# Patient Record
Sex: Male | Born: 1955
Health system: Southern US, Community
[De-identification: ages and names within clinical notes are randomized; demographics above are authoritative.]

## PROBLEM LIST (undated history)

## (undated) DIAGNOSIS — K469 Unspecified abdominal hernia without obstruction or gangrene: Secondary | ICD-10-CM

## (undated) DIAGNOSIS — IMO0002 Reserved for concepts with insufficient information to code with codable children: Secondary | ICD-10-CM

## (undated) DIAGNOSIS — T7840XA Allergy, unspecified, initial encounter: Secondary | ICD-10-CM

## (undated) DIAGNOSIS — H3530 Unspecified macular degeneration: Secondary | ICD-10-CM

## (undated) DIAGNOSIS — M1990 Unspecified osteoarthritis, unspecified site: Secondary | ICD-10-CM

## (undated) DIAGNOSIS — H04129 Dry eye syndrome of unspecified lacrimal gland: Secondary | ICD-10-CM

## (undated) DIAGNOSIS — K3184 Gastroparesis: Secondary | ICD-10-CM

## (undated) DIAGNOSIS — E785 Hyperlipidemia, unspecified: Secondary | ICD-10-CM

## (undated) DIAGNOSIS — K5790 Diverticulosis of intestine, part unspecified, without perforation or abscess without bleeding: Secondary | ICD-10-CM

## (undated) DIAGNOSIS — F1011 Alcohol abuse, in remission: Secondary | ICD-10-CM

## (undated) DIAGNOSIS — I480 Paroxysmal atrial fibrillation: Secondary | ICD-10-CM

## (undated) DIAGNOSIS — H9313 Tinnitus, bilateral: Secondary | ICD-10-CM

## (undated) DIAGNOSIS — N529 Male erectile dysfunction, unspecified: Secondary | ICD-10-CM

## (undated) DIAGNOSIS — C4491 Basal cell carcinoma of skin, unspecified: Secondary | ICD-10-CM

## (undated) DIAGNOSIS — R011 Cardiac murmur, unspecified: Secondary | ICD-10-CM

## (undated) DIAGNOSIS — E119 Type 2 diabetes mellitus without complications: Secondary | ICD-10-CM

## (undated) HISTORY — DX: Basal cell carcinoma of skin, unspecified: C44.91

## (undated) HISTORY — DX: Tinnitus, bilateral: H93.13

## (undated) HISTORY — PX: LUMBAR FUSION: SHX111

## (undated) HISTORY — DX: Male erectile dysfunction, unspecified: N52.9

## (undated) HISTORY — DX: Unspecified macular degeneration: H35.30

## (undated) HISTORY — PX: SPINE SURGERY: SHX786

## (undated) HISTORY — DX: Unspecified abdominal hernia without obstruction or gangrene: K46.9

## (undated) HISTORY — DX: Allergy, unspecified, initial encounter: T78.40XA

## (undated) HISTORY — DX: Paroxysmal atrial fibrillation: I48.0

## (undated) HISTORY — DX: Cardiac murmur, unspecified: R01.1

## (undated) HISTORY — DX: Unspecified osteoarthritis, unspecified site: M19.90

## (undated) HISTORY — PX: THUMB FUSION: SUR636

## (undated) HISTORY — DX: Reserved for concepts with insufficient information to code with codable children: IMO0002

## (undated) HISTORY — DX: Diverticulosis of intestine, part unspecified, without perforation or abscess without bleeding: K57.90

## (undated) HISTORY — DX: Gastroparesis: K31.84

## (undated) HISTORY — DX: Hyperlipidemia, unspecified: E78.5

## (undated) HISTORY — DX: Alcohol abuse, in remission: F10.11

## (undated) HISTORY — DX: Dry eye syndrome of unspecified lacrimal gland: H04.129

## (undated) HISTORY — DX: Type 2 diabetes mellitus without complications: E11.9

---
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
## 1974-06-26 HISTORY — PX: VASECTOMY: SHX75

## 1994-06-26 DIAGNOSIS — R011 Cardiac murmur, unspecified: Secondary | ICD-10-CM

## 1994-06-26 HISTORY — DX: Cardiac murmur, unspecified: R01.1

## 1998-02-15 ENCOUNTER — Ambulatory Visit (HOSPITAL_COMMUNITY): Admission: RE | Admit: 1998-02-15 | Discharge: 1998-02-15 | Payer: Self-pay | Admitting: Gastroenterology

## 2001-10-27 ENCOUNTER — Emergency Department (HOSPITAL_COMMUNITY): Admission: EM | Admit: 2001-10-27 | Discharge: 2001-10-27 | Payer: Self-pay | Admitting: Emergency Medicine

## 2004-06-26 DIAGNOSIS — N529 Male erectile dysfunction, unspecified: Secondary | ICD-10-CM

## 2004-06-26 HISTORY — DX: Male erectile dysfunction, unspecified: N52.9

## 2004-07-12 ENCOUNTER — Ambulatory Visit: Payer: Self-pay | Admitting: Gastroenterology

## 2004-07-20 ENCOUNTER — Ambulatory Visit: Payer: Self-pay | Admitting: Gastroenterology

## 2004-12-28 ENCOUNTER — Encounter: Admission: RE | Admit: 2004-12-28 | Discharge: 2005-01-06 | Payer: Self-pay | Admitting: Occupational Medicine

## 2005-05-02 ENCOUNTER — Encounter: Admission: RE | Admit: 2005-05-02 | Discharge: 2005-05-02 | Payer: Self-pay | Admitting: Orthopaedic Surgery

## 2005-05-17 ENCOUNTER — Encounter: Admission: RE | Admit: 2005-05-17 | Discharge: 2005-05-17 | Payer: Self-pay | Admitting: Orthopaedic Surgery

## 2005-05-23 ENCOUNTER — Encounter: Admission: RE | Admit: 2005-05-23 | Discharge: 2005-06-22 | Payer: Self-pay | Admitting: Orthopaedic Surgery

## 2005-06-01 ENCOUNTER — Encounter: Admission: RE | Admit: 2005-06-01 | Discharge: 2005-06-01 | Payer: Self-pay | Admitting: Orthopaedic Surgery

## 2006-01-30 ENCOUNTER — Inpatient Hospital Stay (HOSPITAL_COMMUNITY): Admission: RE | Admit: 2006-01-30 | Discharge: 2006-02-02 | Payer: Self-pay | Admitting: Neurological Surgery

## 2006-04-26 DIAGNOSIS — C4491 Basal cell carcinoma of skin, unspecified: Secondary | ICD-10-CM

## 2006-04-26 HISTORY — DX: Basal cell carcinoma of skin, unspecified: C44.91

## 2008-07-14 ENCOUNTER — Emergency Department (HOSPITAL_COMMUNITY): Admission: EM | Admit: 2008-07-14 | Discharge: 2008-07-14 | Payer: Self-pay | Admitting: Emergency Medicine

## 2009-08-13 ENCOUNTER — Encounter: Admission: RE | Admit: 2009-08-13 | Discharge: 2009-08-13 | Payer: Self-pay | Admitting: Emergency Medicine

## 2010-11-11 NOTE — Op Note (Signed)
NAME:  Craig, Roberson NO.:  [id number]   MEDICAL RECORD NO.:  [id number]          PATIENT TYPE:  INP   LOCATION:  2899                         FACILITY:  [facility]   PHYSICIAN:  Stefani Dama, M.D.  DATE OF BIRTH:  1956/05/29   DATE OF PROCEDURE:  01/30/2006  DATE OF DISCHARGE:                                 OPERATIVE REPORT   PREOPERATIVE DIAGNOSIS:  Lumbar spondylosis with stenosis of L3-4, lumbar  radiculopathy, retrolisthesis at L3-L4.   POSTOPERATIVE DIAGNOSIS:  Lumbar spondylosis with stenosis of L3-4, lumbar  radiculopathy, retrolisthesis at L3-L4.   PROCEDURE:  Bilateral diskectomy posterior lumbar interbody fusion with PEEK  bone spacers, nonsegmental fixation with pedicle screws at L3-L4.   SURGEON:  Stefani Dama, M.D.   FIRST ASSISTANT:  Payton Doughty, M.D.   ANESTHESIA:  General endotracheal.   INDICATIONS:  Craig Roberson is a 55 year old individual who has had  significant back and bilateral lower extremity pain.  He has primarily a  left lumbar radiculopathy involving the L-3 nerve root was pain radiating  down the anterior thigh.  He has evidence of retrolisthesis at the L3-L4  level with severe generation and some lateral listhesis at L3-L4.  He has  been advised regarding surgical decompression arthrodesis, having failed  extensive efforts at conservative management for the past year's time.   PROCEDURE:  The patient was brought to the operating room supine on the  stretcher.  After the smooth induction of general endotracheal anesthesia,  he was turned prone.  The back was prepped with DuraPrep and draped in a  sterile fashion.  A midline incision was created and carried down to the  lumbodorsal fascia.  The first spinous process was noted be that of L4 on a  radiograph.  Dissection was then carried superiorly to expose L3 and L4 out  through the transverse processes.  Dissection was then taken down to expose  and take out the outer  layer of yellow ligament between L3 and L4.  This was  noted be substantially hypertrophied.  The facette joints were noted to be  markedly dysplastic also, and these were opened.  Large laminotomy removing  the inferior margin of the entirety of the facette joint from L3 was  created.  This was taken up to the region of the pars.  The common dural  tube was exposed on the right side first, and it was noted be severely  compressed secondary to the retrolisthesis causing the stenosis.  The disk  space was cleared, and then the disk space was entered with a 15-blade.  A  combination of curets and rongeurs was then used to evacuate a substantial  quantity of disk material from within the space.  The interspinous ligament  at L3 and L4 was taken down with the rongeur.  A laminar distractor was then  used to distract the space to allow easier access into the disk space  itself.  A series of curets and rongeurs was used in the L3-L4 interspace,  and this space was cleared out.  Attention was then turned to the left side,  where  the space was noted to be even tighter and narrower secondary to some  posterior protrusion of the disk and a slight lateral listhesis that had  occurred over this area.  This area was similarly decompressed, and the disk  space was entered and again, a near-complete diskectomy was performed in  this interspace.  The endplates were rongeured smooth to remove any remnants  of the cartilaginous endplates, and bleeding bone was encountered.  The  interspace was then sized and a series of disk shapers were used to shape  the disk space out to the 12-mm size, particularly on the left side.  Then,  a PEEK spacer was filled with a combination of the patient's own bone mixed  with some OsteoCell material, 5 mL of which had been thawed and mixed with a  combination of the patient's own bone.  This mixture was then placed into  the cage and into the disk space and packed in the  ventral aspect of the  disk space.  The cage was then inserted first on the left side with a  distractor being placed on the right side.  Distractor was moved from the  right side.  The remnants of this side of the interspace were also cleared  in a similar fashion, and then a 12-mm cage was placed in the right side.  Then, with fluoroscopic guidance in the AP and lateral projections, it was  noted that the cages were in good position.  Distraction allowed for  reduction of the retrolisthesis, and then pedicle entry sites were chosen at  L3 and L4; 6.5 x 50-mm screws were used in L3 and L4 to secure these  pedicles with screws.  The screws were first probed individually, and the  holes were tapped.  Each of the holes was checked for cutout, none was  identified, and then the 6.5 x 50-mm screws were inserted into each of the  holes.  Then, 50-mm precontoured rods were placed between the screw heads,  and the tightening was done with the L3 screws being tightened first and  then a slight amount of lordosis being placed into the construct at the L3-  L4 level, tightening the L4 screws.  Good reduction and anatomic alignment  of the vertebra was obtained.  The lateral gutters which had been previously  prepared by decortication were then further decorticated using a high-speed  round bur, and then two pieces of Vitoss tricalcium phosphate sponge were  placed into the lateral gutters to reinforce posterior arthrodesis.  With  this, the wound was copiously irrigated with antibiotic irrigating solution.  Lumbodorsal fascia was then closed with #1 Vicryl in interrupted fashion, 2-  0 Vicryl was used subcutaneously, and 3-0 Vicryl subcuticular.  Dermabond  was used in the skin.  The patient tolerated the procedure well.  Blood loss  was estimated at 300 mL, and 100 mL of Cell Saver blood was returned.      Stefani Dama, M.D.  Electronically Signed    HJE/MEDQ  D:  01/30/2006  T:   01/30/2006  Job:  [id number]

## 2010-11-11 NOTE — Discharge Summary (Signed)
NAME:  [name], [name] NO.:  [id number]   MEDICAL RECORD NO.:  [id number]          PATIENT TYPE:  INP   LOCATION:  3039                         FACILITY:  [facility]   PHYSICIAN:  Stefani Dama, M.D.  DATE OF BIRTH:  1956/06/15   DATE OF ADMISSION:  01/30/2006  DATE OF DISCHARGE:  02/02/2006                                 DISCHARGE SUMMARY   ADMITTING DIAGNOSES:  1. Lumbar spondylosis with stenosis L3-L4.  2. Lumbar radiculopathy.  3. Retrolisthesis L3-L4.   DISCHARGE AND FINAL DIAGNOSES:  1. Lumbar spondylosis with stenosis L3-L4.  2. Lumbar radiculopathy.  3. Retrolisthesis L3-L4.  4. Urinary retention.  5. Post acute blood loss anemia postoperatively.   CONDITION ON DISCHARGE:  Improved.   HOSPITAL COURSE:  Craig Roberson is a 55 year old individual who has had  significant back and bilateral lower extremity pain secondary to  degenerative changes at the L3-L4 level.  He underwent surgical  decompression at this level on 01/30/2006; and postoperatively had  difficulty with urinary retention and required placement of the Foley  catheter on two occasions; and the patient also had substantial difficulties  with initial pain control.  He was noted to have some modest anemia during  the initial postoperative phase, from blood loss, but this has resolved  itself; and he has had not required transfusion.  His incision is clean and  dry at the current time; and the patient is discharged home with  prescriptions.   DISCHARGE MEDICATIONS:  1. Percocet #60 without refills.  2. Valium 5 mg #40 without refills.   FOLLOWUP:  He will be seen in the office in three weeks' time for further  follow-up.   CONDITION ON DISCHARGE:  Improved.      Stefani Dama, M.D.  Electronically Signed     HJE/MEDQ  D:  02/02/2006  T:  02/03/2006  Job:  [id number]

## 2011-08-21 ENCOUNTER — Other Ambulatory Visit: Payer: Self-pay | Admitting: *Deleted

## 2011-08-21 MED ORDER — EZETIMIBE-SIMVASTATIN 10-40 MG PO TABS: 1.0000 | ORAL_TABLET | Freq: Every day | ORAL | Status: DC

## 2011-09-26 ENCOUNTER — Other Ambulatory Visit: Payer: Self-pay

## 2011-09-26 ENCOUNTER — Other Ambulatory Visit: Payer: Self-pay | Admitting: Physician Assistant

## 2011-09-26 NOTE — Telephone Encounter (Signed)
Pt called from pharmacy upset bc we had only RF his vytorin for #15, and he has an appt scheduled for next Monday. Pt has to pay $50 co-pay for either #15 or #30. Told pt that we would change since he has appt scheduled. Called pharmacy and changed RF to #30 for pt and told pharmacist that pt is waiting there.

## 2011-10-02 ENCOUNTER — Ambulatory Visit: Payer: Self-pay | Admitting: Family Medicine

## 2011-10-09 ENCOUNTER — Ambulatory Visit (INDEPENDENT_AMBULATORY_CARE_PROVIDER_SITE_OTHER): Payer: 59 | Admitting: Physician Assistant

## 2011-10-09 VITALS — BP 121/83 | HR 65 | Temp 98.2°F | Resp 16 | Ht 71.5 in | Wt 239.8 lb

## 2011-10-09 DIAGNOSIS — E785 Hyperlipidemia, unspecified: Secondary | ICD-10-CM

## 2011-10-09 DIAGNOSIS — J309 Allergic rhinitis, unspecified: Secondary | ICD-10-CM

## 2011-10-09 DIAGNOSIS — Z79899 Other long term (current) drug therapy: Secondary | ICD-10-CM

## 2011-10-09 LAB — COMPLETE METABOLIC PANEL WITH GFR
ALT: 21 U/L (ref 0–53)
AST: 22 U/L (ref 0–37)
Albumin: 5 g/dL (ref 3.5–5.2)
Alkaline Phosphatase: 76 U/L (ref 39–117)
BUN: 11 mg/dL (ref 6–23)
CO2: 27 mEq/L (ref 19–32)
Calcium: 9.9 mg/dL (ref 8.4–10.5)
Chloride: 104 mEq/L (ref 96–112)
Creat: 0.93 mg/dL (ref 0.50–1.35)
GFR, Est African American: >89 mL/min
GFR, Est Non African American: >89 mL/min
Potassium: 4.3 mEq/L (ref 3.5–5.3)
Sodium: 137 mEq/L (ref 135–145)
Total Bilirubin: 0.6 mg/dL (ref 0.3–1.2)
Total Protein: 7.9 g/dL (ref 6.0–8.3)

## 2011-10-09 LAB — LIPID PANEL
Cholesterol: 176 mg/dL (ref 0–200)
HDL: 41 mg/dL (ref >39)
LDL Cholesterol: 108 mg/dL — ABNORMAL HIGH (ref 0–99)
Total CHOL/HDL Ratio: 4.3 Ratio
Triglycerides: 134 mg/dL (ref <150)
VLDL: 27 mg/dL (ref 0–40)

## 2011-10-09 MED ORDER — AZELASTINE HCL 0.1 % NA SOLN: 1.0000 | Freq: Two times a day (BID) | NASAL | Status: DC

## 2011-10-09 NOTE — Progress Notes (Signed)
  Subjective:    Patient ID: Craig Roberson, male    DOB: May 17, 1956, 56 y.o.   MRN: [id number]  HPI  Presents for labs to assess lipids and liver function.  Has seen Dr. Amanda Pea for the arthritis in his thumbs.  Was given an rx for Celebrex, but hasn't filled it yet. Allergies beginning to act up. Would like to restart Astelin.  Review of Systems No chest pain, SOB, HA, dizziness, vision change, N/V, diarrhea, dysuria, myalgias, arthralgias or rash.     Objective:   Physical Exam Vital signs noted. Well-developed, well nourished WM who is awake, alert and oriented, in NAD. HEENT: [place]/AT, PERRL, EOMI.  Sclera and conjunctiva are clear.  EAC are patent, TMs are normal in appearance. Nasal mucosa is pink and moist. OP is clear. Neck: supple, non-tender, no lymphadenopathey, thyromegaly. Heart: RRR, no murmur Lungs: CTA Skin: warm and dry without rash.     Assessment & Plan:   1. Other and unspecified hyperlipidemia  Lipid Panel, COMPLETE METABOLIC PANEL WITH GFR  2. AR (allergic rhinitis)  azelastine (ASTELIN) 137 MCG/SPRAY nasal spray  3. Encounter for long-term (current) use of other medications  COMPLETE METABOLIC PANEL WITH GFR

## 2011-10-09 NOTE — Patient Instructions (Signed)
If you haven't heard from me about your lab results in 2 weeks, please call the office.  Your physical isn't due until September, let's plan to follow-up then.  Go ahead and try the Celebrex.  It has the potential to cause the same problems as the ibuprofen, so monitor for side effects, and do no take the ibuprofen and Celebrex on the same day.

## 2011-10-10 ENCOUNTER — Encounter: Payer: Self-pay | Admitting: Physician Assistant

## 2011-11-14 ENCOUNTER — Other Ambulatory Visit: Payer: Self-pay | Admitting: Physician Assistant

## 2011-12-26 ENCOUNTER — Encounter: Payer: 59 | Admitting: Physician Assistant

## 2012-01-10 ENCOUNTER — Other Ambulatory Visit: Payer: Self-pay | Admitting: Physician Assistant

## 2012-03-19 ENCOUNTER — Ambulatory Visit (INDEPENDENT_AMBULATORY_CARE_PROVIDER_SITE_OTHER): Payer: 59 | Admitting: Physician Assistant

## 2012-03-19 ENCOUNTER — Encounter: Payer: Self-pay | Admitting: Physician Assistant

## 2012-03-19 VITALS — BP 114/68 | HR 66 | Temp 98.3°F | Resp 16 | Ht 71.5 in | Wt 235.0 lb

## 2012-03-19 DIAGNOSIS — M1990 Unspecified osteoarthritis, unspecified site: Secondary | ICD-10-CM

## 2012-03-19 DIAGNOSIS — M129 Arthropathy, unspecified: Secondary | ICD-10-CM

## 2012-03-19 DIAGNOSIS — Z125 Encounter for screening for malignant neoplasm of prostate: Secondary | ICD-10-CM

## 2012-03-19 DIAGNOSIS — H3530 Unspecified macular degeneration: Secondary | ICD-10-CM | POA: Insufficient documentation

## 2012-03-19 DIAGNOSIS — J309 Allergic rhinitis, unspecified: Secondary | ICD-10-CM

## 2012-03-19 DIAGNOSIS — N529 Male erectile dysfunction, unspecified: Secondary | ICD-10-CM

## 2012-03-19 DIAGNOSIS — K469 Unspecified abdominal hernia without obstruction or gangrene: Secondary | ICD-10-CM

## 2012-03-19 DIAGNOSIS — Z23 Encounter for immunization: Secondary | ICD-10-CM

## 2012-03-19 DIAGNOSIS — R011 Cardiac murmur, unspecified: Secondary | ICD-10-CM | POA: Insufficient documentation

## 2012-03-19 DIAGNOSIS — H9319 Tinnitus, unspecified ear: Secondary | ICD-10-CM

## 2012-03-19 DIAGNOSIS — E782 Mixed hyperlipidemia: Secondary | ICD-10-CM

## 2012-03-19 DIAGNOSIS — H9313 Tinnitus, bilateral: Secondary | ICD-10-CM | POA: Insufficient documentation

## 2012-03-19 LAB — CBC WITH DIFFERENTIAL/PLATELET
Basophils Absolute: 0 10*3/uL (ref 0.0–0.1)
Basophils Relative: 0 % (ref 0–1)
Eosinophils Absolute: 0.2 10*3/uL (ref 0.0–0.7)
Eosinophils Relative: 3 % (ref 0–5)
HCT: 45.6 % (ref 39.0–52.0)
Hemoglobin: 15.6 g/dL (ref 13.0–17.0)
Lymphocytes Relative: 29 % (ref 12–46)
Lymphs Abs: 2.1 10*3/uL (ref 0.7–4.0)
MCHC: 34.2 g/dL (ref 30.0–36.0)
MCV: 93.1 fL (ref 78.0–100.0)
Monocytes Relative: 12 % (ref 3–12)
Neutro Abs: 4.1 10*3/uL (ref 1.7–7.7)
Neutrophils Relative %: 56 % (ref 43–77)
Platelets: 291 10*3/uL (ref 150–400)
RBC: 4.9 MIL/uL (ref 4.22–5.81)
RDW: 13 % (ref 11.5–15.5)
WBC: 7.3 10*3/uL (ref 4.0–10.5)

## 2012-03-19 LAB — COMPREHENSIVE METABOLIC PANEL
AST: 16 U/L (ref 0–37)
Albumin: 5.1 g/dL (ref 3.5–5.2)
Alkaline Phosphatase: 73 U/L (ref 39–117)
BUN: 13 mg/dL (ref 6–23)
CO2: 25 mEq/L (ref 19–32)
Calcium: 9.6 mg/dL (ref 8.4–10.5)
Chloride: 104 mEq/L (ref 96–112)
Creat: 0.84 mg/dL (ref 0.50–1.35)
Glucose, Bld: 106 mg/dL — ABNORMAL HIGH (ref 70–99)
Potassium: 4.2 mEq/L (ref 3.5–5.3)
Sodium: 139 mEq/L (ref 135–145)
Total Protein: 7.6 g/dL (ref 6.0–8.3)

## 2012-03-19 LAB — LIPID PANEL
Cholesterol: 176 mg/dL (ref 0–200)
HDL: 38 mg/dL — ABNORMAL LOW (ref >39)
LDL Cholesterol: 115 mg/dL — ABNORMAL HIGH (ref 0–99)
Total CHOL/HDL Ratio: 4.6 Ratio
Triglycerides: 113 mg/dL (ref <150)
VLDL: 23 mg/dL (ref 0–40)

## 2012-03-19 LAB — PSA: PSA: 0.64 ng/mL (ref <=4.00)

## 2012-03-19 MED ORDER — VARDENAFIL HCL 10 MG PO TABS: 10.0000 mg | ORAL_TABLET | Freq: Every day | ORAL | Status: DC | PRN

## 2012-03-19 MED ORDER — EZETIMIBE-SIMVASTATIN 10-40 MG PO TABS: 1.0000 | ORAL_TABLET | Freq: Every day | ORAL | Status: DC

## 2012-03-19 MED ORDER — IBUPROFEN 800 MG PO TABS: 800.0000 mg | ORAL_TABLET | Freq: Three times a day (TID) | ORAL | Status: DC | PRN

## 2012-03-19 MED ORDER — DIAZEPAM 10 MG PO TABS: 10.0000 mg | ORAL_TABLET | Freq: Four times a day (QID) | ORAL | Status: DC | PRN

## 2012-03-19 MED ORDER — AZELASTINE HCL 0.1 % NA SOLN: 1.0000 | Freq: Two times a day (BID) | NASAL | Status: DC

## 2012-03-19 NOTE — Progress Notes (Signed)
Subjective:    Patient ID: Craig Roberson, male    DOB: 11/27/1955, 56 y.o.   MRN: [id number]  HPI This 56 y.o. male presents for evaluation of Hyperlipidemia.  He also complains of continued bilateral thumb pain.  He is s/p fusion of both, and was most recently given Celebrex, though he notes that it didn't help at all.  He has a history of PUD due to NSAIDS, so takes it irregularly.  He'd like to try something else for ED-Cialis daily didn't help "at all" and Viagra caused flushing.  Review of Systems  Constitutional: Negative.   HENT: Positive for tinnitus (chronic; valium helps some).   Eyes: Negative.   Respiratory: Negative.   Cardiovascular: Negative.   Gastrointestinal: Negative.   Genitourinary: Negative.   Musculoskeletal: Positive for arthralgias (bilateral thumbs).  Skin: Negative.   Neurological: Negative.   Hematological: Negative.   Psychiatric/Behavioral: Negative.       Past Medical History  Diagnosis Date  . Arthritis     bilateral thumbs  . Allergy   . Cardiac murmur 1996    normal ECHO  . Hyperlipidemia   . ED (erectile dysfunction) 2006  . H/O ETOH abuse     Quit 2006  . Tinnitus of both ears   . BCC (basal cell carcinoma of skin) 04/2006    LEFT low back  . Ulcer   . Macular degeneration   . Diverticulosis   . Hernia     RIGHT inguinal    Past Surgical History  Procedure Date  . Ganglion cyst excision 1970's  . Thumb fusion     BILATERAL  . Lumbar fusion     L3-4; Dr. Danielle Dess  . Vasectomy 1976    Prior to Admission medications   Medication Sig Start Date End Date Taking? Authorizing Provider  azelastine (ASTELIN) 137 MCG/SPRAY nasal spray Place 1 spray into the nose 2 (two) times daily. Use in each nostril as directed 03/19/12 03/19/13 Yes Karoline Fleer S Dewight Catino, PA-C  diazepam (VALIUM) 10 MG tablet Take 1 tablet (10 mg total) by mouth every 6 (six) hours as needed. 03/19/12  Yes Yasseen Salls S Tymika Grilli, PA-C  ezetimibe-simvastatin (VYTORIN) 10-40  MG per tablet Take 1 tablet by mouth at bedtime. 03/19/12  Yes Lillan Mccreadie S Maleke Feria, PA-C  fish oil-omega-3 fatty acids 1000 MG capsule Take 2 g by mouth daily.   Yes Historical Provider, MD  ibuprofen (ADVIL,MOTRIN) 800 MG tablet Take 1 tablet (800 mg total) by mouth every 8 (eight) hours as needed for pain. 03/19/12   Nyomie Ehrlich S Oneika Simonian, PA-C  vardenafil (LEVITRA) 10 MG tablet Take 1 tablet (10 mg total) by mouth daily as needed for erectile dysfunction. 03/19/12   Odetta Forness Tessa Lerner, PA-C    Allergies  Allergen Reactions  . Codeine Other (See Comments)    Dizziness; faint  . Methocarbamol Other (See Comments)    HA, tinnitus  . Morphine And Related Other (See Comments)    Severe Headache  . Nsaids     BRBPR    History   Social History  . Marital Status: Married    Spouse Name: Tiney Rouge    Number of Children: 2  . Years of Education: N/A   Occupational History  . Retired     Chief Financial Officer   Social History Main Topics  . Smoking status: Former Games developer  . Smokeless tobacco: Current User    Types: Chew  . Alcohol Use: No  . Drug Use: No  . Sexually Active: Not  Currently -- Male partner(s)     family stress; his back pain have reduced the frequency   Social History Narrative   Married to 3rd wife.    Family History  Problem Relation Age of Onset  . Atrial fibrillation Brother   . Heart disease Brother   . Heart disease Father   . Cancer Father     Prostate  . Stroke Son 35       Objective:   Physical Exam  Constitutional: He is oriented to person, place, and time. Vital signs are normal. He appears well-developed and well-nourished. No distress.  HENT:  Head: Normocephalic and atraumatic.  Right Ear: Hearing normal.  Left Ear: Hearing normal.  Eyes: EOM are normal. Pupils are equal, round, and reactive to light.  Neck: Normal range of motion. Neck supple. No thyromegaly present.  Cardiovascular: Normal rate, regular rhythm and normal heart sounds.     Pulses:      Radial pulses are 2+ on the right side, and 2+ on the left side.       Dorsalis pedis pulses are 2+ on the right side, and 2+ on the left side.       Posterior tibial pulses are 2+ on the right side, and 2+ on the left side.  Pulmonary/Chest: Effort normal and breath sounds normal.  Lymphadenopathy:       Head (right side): No tonsillar, no preauricular, no posterior auricular and no occipital adenopathy present.       Head (left side): No tonsillar, no preauricular, no posterior auricular and no occipital adenopathy present.    He has no cervical adenopathy.       Right: No supraclavicular adenopathy present.       Left: No supraclavicular adenopathy present.  Neurological: He is alert and oriented to person, place, and time. No sensory deficit.  Skin: Skin is warm, dry and intact. No rash noted. No cyanosis or erythema. Nails show no clubbing.  Psychiatric: He has a normal mood and affect.          Assessment & Plan:   1. Mixed hyperlipidemia  ezetimibe-simvastatin (VYTORIN) 10-40 MG per tablet, Comprehensive metabolic panel, Lipid panel  2. AR (allergic rhinitis)  azelastine (ASTELIN) 137 MCG/SPRAY nasal spray  3. Hernia  Has had evaluation with surgery; no procedure planned for now.  4. Tinnitus  diazepam (VALIUM) 10 MG tablet  5. ED (erectile dysfunction)  vardenafil (LEVITRA) 10 MG tablet  6. Arthritis  ibuprofen (ADVIL,MOTRIN) 800 MG tablet, CBC with Differential  7. Need for prophylactic vaccination and inoculation against influenza  Flu vaccine greater than or equal to 3yo preservative free IM  8. Screening for prostate cancer  PSA; will schedule CPE today and perform DRE then.

## 2012-03-19 NOTE — Assessment & Plan Note (Signed)
Trial of Levitra 

## 2012-03-19 NOTE — Patient Instructions (Signed)
Be sure to take the Ibuprofen only when you need it, and STOP immediately (and come in for evaluation) if you notice any blood in your stools or dark color of your stools.

## 2012-03-20 ENCOUNTER — Encounter: Payer: Self-pay | Admitting: Physician Assistant

## 2012-04-08 ENCOUNTER — Ambulatory Visit (INDEPENDENT_AMBULATORY_CARE_PROVIDER_SITE_OTHER): Payer: 59 | Admitting: Emergency Medicine

## 2012-04-08 VITALS — BP 132/83 | HR 76 | Temp 98.0°F | Resp 16 | Ht 71.5 in | Wt 236.0 lb

## 2012-04-08 DIAGNOSIS — S335XXA Sprain of ligaments of lumbar spine, initial encounter: Secondary | ICD-10-CM

## 2012-04-08 MED ORDER — HYDROCODONE-ACETAMINOPHEN 5-325 MG PO TABS: 1.0000 | ORAL_TABLET | ORAL | Status: AC | PRN

## 2012-04-08 MED ORDER — NAPROXEN SODIUM 550 MG PO TABS: 550.0000 mg | ORAL_TABLET | Freq: Two times a day (BID) | ORAL | Status: DC

## 2012-04-08 MED ORDER — CYCLOBENZAPRINE HCL 10 MG PO TABS: 10.0000 mg | ORAL_TABLET | Freq: Three times a day (TID) | ORAL | Status: DC | PRN

## 2012-04-08 NOTE — Progress Notes (Signed)
[facility] [address], [place] [address] [phone]  Date:  04/08/2012   Name:  Craig Roberson   DOB:  [birth year]   MRN:  [id number]  PCP:  Rayn Enderson,CHELLE, PA-C    Chief Complaint: Back Pain   History of Present Illness:  Craig Roberson is a 56 y.o. very pleasant male patient who presents with the following:  Got out of bed and stretched and felt immediate low back pain.  Pain in para vertebral muscles.  Pain not radiating no neurological symptoms.  Denies overuse or reinjury.  History of lumbar fusion within past 10 years.  Has a similar episode annually.  Not incontinent of urine or stool.   Patient Active Problem List  Diagnosis  . Cardiac murmur  . ED (erectile dysfunction)  . Tinnitus of both ears  . Macular degeneration  . Hernia  . AR (allergic rhinitis)  . Mixed hyperlipidemia    Past Medical History  Diagnosis Date  . Arthritis     bilateral thumbs  . Allergy   . Cardiac murmur 1996    normal ECHO  . Hyperlipidemia   . ED (erectile dysfunction) 2006  . H/O ETOH abuse     Quit 2006  . Tinnitus of both ears   . BCC (basal cell carcinoma of skin) 04/2006    LEFT low back  . Ulcer   . Macular degeneration   . Diverticulosis   . Hernia     RIGHT inguinal    Past Surgical History  Procedure Date  . Ganglion cyst excision 1970's  . Thumb fusion     BILATERAL  . Lumbar fusion     L3-4; Dr. Danielle Dess  . Vasectomy 1976    History  Substance Use Topics  . Smoking status: Former Games developer  . Smokeless tobacco: Current User    Types: Chew  . Alcohol Use: No    Family History  Problem Relation Age of Onset  . Atrial fibrillation Brother   . Heart disease Brother   . Heart disease Father   . Cancer Father     Prostate  . Stroke Son 35    Allergies  Allergen Reactions  . Codeine Other (See Comments)    Dizziness; faint  . Methocarbamol Other (See Comments)    HA, tinnitus  . Morphine And Related Other  (See Comments)    Severe Headache  . Nsaids     BRBPR    Medication list has been reviewed and updated.  Current Outpatient Prescriptions on File Prior to Visit  Medication Sig Dispense Refill  . azelastine (ASTELIN) 137 MCG/SPRAY nasal spray Place 1 spray into the nose 2 (two) times daily. Use in each nostril as directed  30 mL  12  . diazepam (VALIUM) 10 MG tablet Take 1 tablet (10 mg total) by mouth every 6 (six) hours as needed.  30 tablet  0  . ezetimibe-simvastatin (VYTORIN) 10-40 MG per tablet Take 1 tablet by mouth at bedtime.  30 tablet  5  . fish oil-omega-3 fatty acids 1000 MG capsule Take 2 g by mouth daily.      Marland Kitchen ibuprofen (ADVIL,MOTRIN) 800 MG tablet Take 1 tablet (800 mg total) by mouth every 8 (eight) hours as needed for pain.  30 tablet  1  . vardenafil (LEVITRA) 10 MG tablet Take 1 tablet (10 mg total) by mouth daily as needed for erectile dysfunction.  10 tablet  5    Review of Systems:  As per HPI, otherwise negative.    Physical Examination: Filed Vitals:   04/08/12 1115  BP: 132/83  Pulse: 76  Temp: 98 F (36.7 C)  Resp: 16   Filed Vitals:   04/08/12 1115  Height: 5' 11.5" (1.816 m)  Weight: 236 lb (107.049 kg)   Body mass index is 32.46 kg/(m^2). Ideal Body Weight: Weight in (lb) to have BMI = 25: 181.4    GEN: WDWN, NAD, Non-toxic, Alert & Oriented x 3 HEENT: Atraumatic, Normocephalic.  Ears and Nose: No external deformity. EXTR: No clubbing/cyanosis/edema NEURO: Normal gait.  PSYCH: Normally interactive. Conversant. Not depressed or anxious appearing.  Calm demeanor.  Back:  Tender lumbar para vertebral muscles and moderate spasm.  Neuro intact.  Assessment and Plan: Lumbar strain Flexeril Anaprox ds Hydrocodone Local heat Follow up as needed  Carmelina Dane, MD  I have reviewed and agree with documentation. Robert P. Merla Riches, M.D.

## 2012-04-16 ENCOUNTER — Encounter: Payer: 59 | Admitting: Physician Assistant

## 2012-04-21 ENCOUNTER — Other Ambulatory Visit: Payer: Self-pay | Admitting: Physician Assistant

## 2012-04-27 ENCOUNTER — Other Ambulatory Visit: Payer: Self-pay | Admitting: Physician Assistant

## 2012-04-28 ENCOUNTER — Ambulatory Visit (INDEPENDENT_AMBULATORY_CARE_PROVIDER_SITE_OTHER): Payer: 59 | Admitting: Family Medicine

## 2012-04-28 VITALS — BP 142/91 | HR 77 | Temp 97.7°F | Resp 16 | Ht 71.18 in | Wt 233.6 lb

## 2012-04-28 DIAGNOSIS — M545 Low back pain, unspecified: Secondary | ICD-10-CM

## 2012-04-28 DIAGNOSIS — M5416 Radiculopathy, lumbar region: Secondary | ICD-10-CM

## 2012-04-28 DIAGNOSIS — IMO0002 Reserved for concepts with insufficient information to code with codable children: Secondary | ICD-10-CM

## 2012-04-28 MED ORDER — PREDNISONE 20 MG PO TABS: ORAL_TABLET | ORAL | Status: DC

## 2012-04-28 NOTE — Progress Notes (Signed)
56 yo retired, disabled man from 2006 lumbar disc surgery(Elsner) and bilateral thumb joint surgery ([facility]).  He has had a month of LBP radiating to right anterior thigh.  He went to the orthopedist last week who did not find an explanation on his MRI.   The pain has increased and is now throbbing, made worse by straight leg raising either leg.  His family and friends say he is walking "crooked."  No bladder or bowel symptoms but his leg does get numb in the right anterior thigh.    Pain is relieved by lying down.  He has tried 800 mg ibuprofen last night.  He denies fever.  Objective:  Alert, NAD Abdomen: soft, nontender SLR:  Positive either leg at 30 degrees. Good strength each leg with leg lifting Sensation is normal each leg  Assessment:  Acute nerve root symptoms  Plan:

## 2012-05-27 ENCOUNTER — Other Ambulatory Visit: Payer: Self-pay | Admitting: Physician Assistant

## 2012-05-29 ENCOUNTER — Other Ambulatory Visit: Payer: Self-pay | Admitting: Family Medicine

## 2012-05-29 NOTE — Telephone Encounter (Signed)
Called in diazepam 10 mg 1 po q 6 hrs prn # 30 0 refills [facility] [address].

## 2012-06-27 ENCOUNTER — Other Ambulatory Visit: Payer: Self-pay | Admitting: Physician Assistant

## 2012-06-27 NOTE — Telephone Encounter (Signed)
This patient is due for his annual physical.  What's the plan?

## 2012-07-01 ENCOUNTER — Telehealth: Payer: Self-pay

## 2012-07-01 NOTE — Telephone Encounter (Signed)
Pt states that his pharmacy has sent over numerous requests for his diazepam rx but they have not received a response. Best# [phone]

## 2012-07-02 MED ORDER — DIAZEPAM 10 MG PO TABS: 5.0000 mg | ORAL_TABLET | Freq: Three times a day (TID) | ORAL | Status: DC | PRN

## 2012-07-02 NOTE — Telephone Encounter (Signed)
Patient called back again asking for you. He states he did not get my messages. I have advised him he needs an appt. He was transferred to appt scheduling.

## 2012-07-02 NOTE — Telephone Encounter (Signed)
Please advise on renewal of Diazepam.

## 2012-07-02 NOTE — Telephone Encounter (Signed)
I called patient and he has not returned my call. I had not finished documenting, I called again and have left him another message.

## 2012-07-02 NOTE — Telephone Encounter (Signed)
I'm not sure why, but my response to the refill request didn't go anywhere.  He's overdue for a physical.  What's his plan?

## 2012-07-02 NOTE — Telephone Encounter (Signed)
I have left him 2 messages, and he does not call me back. I have told him again to call me.

## 2012-07-02 NOTE — Telephone Encounter (Signed)
Phoned this in for him, he is advised this is done, he has scheduled appt on 08/20/12.

## 2012-07-03 ENCOUNTER — Other Ambulatory Visit: Payer: Self-pay | Admitting: Physician Assistant

## 2012-07-29 ENCOUNTER — Other Ambulatory Visit: Payer: Self-pay | Admitting: Physician Assistant

## 2012-07-30 ENCOUNTER — Other Ambulatory Visit: Payer: Self-pay | Admitting: Physician Assistant

## 2012-08-20 ENCOUNTER — Ambulatory Visit (INDEPENDENT_AMBULATORY_CARE_PROVIDER_SITE_OTHER): Payer: 59 | Admitting: Physician Assistant

## 2012-08-20 ENCOUNTER — Encounter: Payer: Self-pay | Admitting: Physician Assistant

## 2012-08-20 VITALS — BP 110/72 | HR 64 | Temp 98.0°F | Resp 16 | Ht 71.0 in | Wt 238.6 lb

## 2012-08-20 DIAGNOSIS — J309 Allergic rhinitis, unspecified: Secondary | ICD-10-CM

## 2012-08-20 LAB — POCT URINALYSIS DIPSTICK
Glucose, UA: NEGATIVE
Ketones, UA: NEGATIVE
Leukocytes, UA: NEGATIVE
Spec Grav, UA: 1.02

## 2012-08-20 LAB — IFOBT (OCCULT BLOOD): IFOBT: NEGATIVE

## 2012-08-20 LAB — COMPREHENSIVE METABOLIC PANEL
ALT: 18 U/L (ref 0–53)
Albumin: 4.5 g/dL (ref 3.5–5.2)
CO2: 25 mEq/L (ref 19–32)
Calcium: 9.3 mg/dL (ref 8.4–10.5)
Chloride: 104 mEq/L (ref 96–112)
Creat: 0.88 mg/dL (ref 0.50–1.35)
Potassium: 4.2 mEq/L (ref 3.5–5.3)
Sodium: 139 mEq/L (ref 135–145)
Total Protein: 7.1 g/dL (ref 6.0–8.3)

## 2012-08-20 LAB — CBC WITH DIFFERENTIAL/PLATELET
Eosinophils Relative: 2 % (ref 0–5)
Lymphocytes Relative: 28 % (ref 12–46)
Lymphs Abs: 2.2 10*3/uL (ref 0.7–4.0)
MCV: 91.1 fL (ref 78.0–100.0)
Platelets: 313 10*3/uL (ref 150–400)
RBC: 4.95 MIL/uL (ref 4.22–5.81)
WBC: 7.9 10*3/uL (ref 4.0–10.5)

## 2012-08-20 LAB — POCT UA - MICROSCOPIC ONLY
Bacteria, U Microscopic: NEGATIVE
WBC, Ur, HPF, POC: NEGATIVE

## 2012-08-20 LAB — TSH: TSH: 1.142 u[IU]/mL (ref 0.350–4.500)

## 2012-08-20 LAB — LIPID PANEL: Cholesterol: 171 mg/dL (ref 0–200)

## 2012-08-20 MED ORDER — EZETIMIBE-SIMVASTATIN 10-40 MG PO TABS: 1.0000 | ORAL_TABLET | Freq: Every day | ORAL | Status: DC

## 2012-08-20 MED ORDER — DIAZEPAM 10 MG PO TABS: 10.0000 mg | ORAL_TABLET | Freq: Three times a day (TID) | ORAL | Status: DC | PRN

## 2012-08-20 MED ORDER — AZELASTINE HCL 0.1 % NA SOLN: 1.0000 | Freq: Two times a day (BID) | NASAL | Status: DC

## 2012-08-20 MED ORDER — CYCLOBENZAPRINE HCL 10 MG PO TABS: 10.0000 mg | ORAL_TABLET | Freq: Three times a day (TID) | ORAL | Status: DC | PRN

## 2012-08-20 MED ORDER — IBUPROFEN 800 MG PO TABS: 800.0000 mg | ORAL_TABLET | Freq: Three times a day (TID) | ORAL | Status: DC | PRN

## 2012-08-20 NOTE — Progress Notes (Signed)
Subjective:    Patient ID: Craig Roberson, male    DOB: 11-25-1955, 57 y.o.   MRN: [id number]  HPI This 57 y.o. male presents for CPE.  Has had an injection in the low back since his last visit with great improvement in his back pain.  With prolonged standing he still gets numbness and tingling in the right leg, but his movements and walking are unaffected at present.   Past Medical History  Diagnosis Date  . Arthritis     bilateral thumbs  . Allergy   . Cardiac murmur 1996    normal ECHO  . Hyperlipidemia   . ED (erectile dysfunction) 2006  . H/O ETOH abuse     Quit 2006  . Tinnitus of both ears   . BCC (basal cell carcinoma of skin) 04/2006    LEFT low back  . Ulcer   . Macular degeneration   . Diverticulosis   . Hernia     RIGHT inguinal    Past Surgical History  Procedure Laterality Date  . Ganglion cyst excision  1970's  . Thumb fusion      BILATERAL  . Lumbar fusion      L3-4; Dr. Danielle Dess  . Vasectomy  1976  . Spine surgery      Prior to Admission medications   Medication Sig Start Date End Date Taking? Authorizing Provider  azelastine (ASTELIN) 137 MCG/SPRAY nasal spray Place 1 spray into the nose 2 (two) times daily. Use in each nostril as directed 08/20/12 08/20/13 Yes Ezreal Turay S Kyrene Longan, PA-C  cyclobenzaprine (FLEXERIL) 10 MG tablet Take 1 tablet (10 mg total) by mouth 3 (three) times daily as needed for muscle spasms. 08/20/12  Yes Fallon Haecker S Mariaclara Spear, PA-C  diazepam (VALIUM) 10 MG tablet Take 1 tablet (10 mg total) by mouth every 8 (eight) hours as needed (tinnitus). 08/20/12  Yes Josetta Wigal S Duanna Runk, PA-C  ezetimibe-simvastatin (VYTORIN) 10-40 MG per tablet Take 1 tablet by mouth at bedtime. 08/20/12  Yes Angel Weedon S Bernice Mullin, PA-C  fish oil-omega-3 fatty acids 1000 MG capsule Take 2 g by mouth daily.   Yes Historical Provider, MD  ibuprofen (ADVIL,MOTRIN) 800 MG tablet Take 1 tablet (800 mg total) by mouth every 8 (eight) hours as needed for pain. 08/20/12  Yes  Lezly Rumpf S Alazne Quant, PA-C  vardenafil (LEVITRA) 10 MG tablet Take 1 tablet (10 mg total) by mouth daily as needed for erectile dysfunction. 03/19/12   Jagger Demonte Tessa Lerner, PA-C    Allergies  Allergen Reactions  . Codeine Other (See Comments)    Dizziness; faint  . Methocarbamol Other (See Comments)    HA, tinnitus  . Morphine And Related Other (See Comments)    Severe Headache  . Nsaids     BRBPR    History   Social History  . Marital Status: Married    Spouse Name: Tiney Rouge    Number of Children: 2  . Years of Education: 8   Occupational History  . Retired     Chief Financial Officer   Social History Main Topics  . Smoking status: Former Games developer  . Smokeless tobacco: Current User    Types: Chew  . Alcohol Use: No  . Drug Use: No  . Sexually Active: Not Currently -- Male partner(s)     Comment: family stress; his back pain have reduced the frequency   Other Topics Concern  . Not on file   Social History Narrative   Married to 3rd wife.    Family History  Problem Relation Age of Onset  . Atrial fibrillation Brother   . Heart disease Brother   . Heart disease Father   . Cancer Father     Prostate  . Stroke Son 35    Review of Systems  Constitutional: Negative.   HENT: Positive for tinnitus (chronic, stable.  Treats with prn valium).   Eyes: Positive for photophobia (due to macular degeneration.  Stable by report.  no associated HA.).  Respiratory: Negative.   Cardiovascular: Negative.   Gastrointestinal: Negative.   Endocrine: Negative.   Genitourinary: Negative.   Musculoskeletal: Positive for myalgias, back pain, joint swelling, arthralgias and gait problem.  Skin: Negative.   Allergic/Immunologic: Negative.   Neurological: Negative.   Hematological: Negative.   Psychiatric/Behavioral: Negative.        Objective:   Physical Exam  Vitals reviewed. Constitutional: He is oriented to person, place, and time. Vital signs are normal. He appears  well-developed and well-nourished. He is active and cooperative.  Non-toxic appearance. He does not have a sickly appearance. He does not appear ill. No distress.  HENT:  Head: Normocephalic and atraumatic. No trismus in the jaw.  Right Ear: Hearing, tympanic membrane, external ear and ear canal normal.  Left Ear: Hearing, tympanic membrane, external ear and ear canal normal.  Nose: Nose normal.  Mouth/Throat: Uvula is midline, oropharynx is clear and moist and mucous membranes are normal. He does not have dentures. No oral lesions. Normal dentition. No dental abscesses, edematous, lacerations or dental caries.  Eyes: Conjunctivae and EOM are normal. Pupils are equal, round, and reactive to light. Right eye exhibits no discharge. Left eye exhibits no discharge. No scleral icterus.  Fundoscopic exam:      The right eye shows no arteriolar narrowing, no AV nicking, no exudate, no hemorrhage and no papilledema.       The left eye shows no arteriolar narrowing, no AV nicking, no exudate, no hemorrhage and no papilledema.  Neck: Normal range of motion, full passive range of motion without pain and phonation normal. Neck supple. No spinous process tenderness and no muscular tenderness present. No rigidity. No tracheal deviation, no edema, no erythema and normal range of motion present. No thyromegaly present.  Cardiovascular: Normal rate, regular rhythm, S1 normal, S2 normal, intact distal pulses and normal pulses.  Exam reveals no gallop and no friction rub.   Murmur heard.  Systolic murmur is present with a grade of 2/6  Pulmonary/Chest: Effort normal and breath sounds normal. No respiratory distress. He has no wheezes. He has no rales.  Abdominal: Soft. Normal appearance and bowel sounds are normal. He exhibits no distension and no mass. There is no hepatosplenomegaly. There is no tenderness. There is no rebound and no guarding. No hernia. Hernia confirmed negative in the right inguinal area and  confirmed negative in the left inguinal area.  Genitourinary: Rectum normal, prostate normal, testes normal and penis normal. Guaiac negative stool.    Circumcised. No phimosis, paraphimosis, hypospadias, penile erythema or penile tenderness. No discharge found.  Musculoskeletal: Normal range of motion. He exhibits no edema and no tenderness.       Right shoulder: Normal.       Left shoulder: Normal.       Right elbow: Normal.      Left elbow: Normal.       Right wrist: Normal.       Left wrist: Normal.       Right hip: Normal.       Left  hip: Normal.       Right knee: Normal.       Left knee: Normal.       Right ankle: Normal. Achilles tendon normal.       Left ankle: Normal. Achilles tendon normal.       Cervical back: Normal. He exhibits normal range of motion, no tenderness, no bony tenderness, no swelling, no edema, no deformity, no laceration, no pain, no spasm and normal pulse.       Thoracic back: Normal.       Lumbar back: Normal.       Right upper arm: Normal.       Left upper arm: Normal.       Right forearm: Normal.       Left forearm: Normal.       Arms:      Right hand: Normal.       Left hand: Normal.       Right upper leg: Normal.       Left upper leg: Normal.       Right lower leg: Normal.       Left lower leg: Normal.       Right foot: Normal.       Left foot: Normal.  Lymphadenopathy:       Head (right side): No submental, no submandibular, no tonsillar, no preauricular, no posterior auricular and no occipital adenopathy present.       Head (left side): No submental, no submandibular, no tonsillar, no preauricular, no posterior auricular and no occipital adenopathy present.    He has no cervical adenopathy.       Right: No inguinal and no supraclavicular adenopathy present.       Left: No inguinal and no supraclavicular adenopathy present.  Neurological: He is alert and oriented to person, place, and time. He has normal strength and normal reflexes. He  displays no tremor. No cranial nerve deficit. He exhibits normal muscle tone. Coordination and gait normal.  Skin: Skin is warm, dry and intact. No abrasion, no ecchymosis, no laceration, no lesion and no rash noted. He is not diaphoretic. No cyanosis or erythema. No pallor. Nails show no clubbing.  Psychiatric: He has a normal mood and affect. His speech is normal and behavior is normal. Judgment and thought content normal. Cognition and memory are normal.   Results for orders placed in visit on 08/20/12  IFOBT (OCCULT BLOOD)      Result Value Range   IFOBT Negative    POCT UA - MICROSCOPIC ONLY      Result Value Range   WBC, Ur, HPF, POC neg     RBC, urine, microscopic 0-1     Bacteria, U Microscopic neg     Mucus, UA neg     Epithelial cells, urine per micros 0-1     Crystals, Ur, HPF, POC neg     Casts, Ur, LPF, POC neg     Yeast, UA neg    POCT URINALYSIS DIPSTICK      Result Value Range   Color, UA yellow     Clarity, UA clear     Glucose, UA neg     Bilirubin, UA neg     Ketones, UA neg     Spec Grav, UA 1.020     Blood, UA neg     pH, UA 7.5     Protein, UA neg     Urobilinogen, UA 0.2     Nitrite, UA neg  Leukocytes, UA Negative        Assessment & Plan:  Routine general medical examination at a health care facility - Plan: CBC with Differential, TSH, POCT UA - Microscopic Only, POCT urinalysis dipstick; Age appropriate anticipatory guidance provided.  Mixed hyperlipidemia - Plan: ezetimibe-simvastatin (VYTORIN) 10-40 MG per tablet, Comprehensive metabolic panel, Lipid panel; healthy eating, regular exercise.  Tinnitus, bilateral - Plan: diazepam (VALIUM) 10 MG tablet  Lumbar radicular pain - Plan: cyclobenzaprine (FLEXERIL) 10 MG tablet, ibuprofen (ADVIL,MOTRIN) 800 MG tablet; he is again reminded to monitor for melena, GI upset and dizziness due to his history of GI bleed associated with NSAID use.  He knows to limit his use to only when absolutely needed.  AR  (allergic rhinitis) - Plan: azelastine (ASTELIN) 137 MCG/SPRAY nasal spray  Screening for prostate cancer - Plan: PSA  Special screening for malignant neoplasms, colon - Plan: IFOBT POC (occult bld, rslt in office)

## 2012-08-20 NOTE — Patient Instructions (Signed)

## 2012-08-22 ENCOUNTER — Encounter: Payer: Self-pay | Admitting: Physician Assistant

## 2012-09-11 ENCOUNTER — Telehealth: Payer: Self-pay | Admitting: Radiology

## 2012-09-11 NOTE — Telephone Encounter (Signed)
Letter sent to patient per Dr Lauenstein.  

## 2012-09-23 ENCOUNTER — Ambulatory Visit (INDEPENDENT_AMBULATORY_CARE_PROVIDER_SITE_OTHER): Payer: 59 | Admitting: Emergency Medicine

## 2012-09-23 VITALS — BP 134/90 | HR 108 | Temp 98.1°F | Resp 16 | Ht 71.0 in | Wt 242.0 lb

## 2012-09-23 DIAGNOSIS — E782 Mixed hyperlipidemia: Secondary | ICD-10-CM

## 2012-09-23 DIAGNOSIS — S335XXA Sprain of ligaments of lumbar spine, initial encounter: Secondary | ICD-10-CM

## 2012-09-23 DIAGNOSIS — M5431 Sciatica, right side: Secondary | ICD-10-CM

## 2012-09-23 DIAGNOSIS — M5430 Sciatica, unspecified side: Secondary | ICD-10-CM

## 2012-09-23 DIAGNOSIS — M5416 Radiculopathy, lumbar region: Secondary | ICD-10-CM

## 2012-09-23 MED ORDER — NAPROXEN SODIUM 550 MG PO TABS: 550.0000 mg | ORAL_TABLET | Freq: Two times a day (BID) | ORAL | Status: DC

## 2012-09-23 MED ORDER — CYCLOBENZAPRINE HCL 10 MG PO TABS: 10.0000 mg | ORAL_TABLET | Freq: Three times a day (TID) | ORAL | Status: DC | PRN

## 2012-09-23 MED ORDER — HYDROCODONE-ACETAMINOPHEN 5-325 MG PO TABS: 1.0000 | ORAL_TABLET | ORAL | Status: DC | PRN

## 2012-09-23 NOTE — Progress Notes (Signed)
[facility] [address], [place] [address] [phone]  Date:  09/23/2012   Name:  Craig Roberson   DOB:  1956-01-22   MRN:  [id number]  PCP:  JEFFERY,CHELLE, PA-C    Chief Complaint: Back Pain and Hip Pain   History of Present Illness:  Craig Roberson is a 57 y.o. very pleasant male patient who presents with the following:  Working under a Statistician changing a fuse.  Developed right low back pain that radiates into the right leg when he stands still.  Relieved by hunching forward worse with upright posture and taking ibuprofen transiently.  History of laminectomy.  No numbness or tingling.  Denies injury.  No improvement with over the counter medications or other home remedies. Denies other complaint or health concern today. Reviewed his medication history with him.  He showed me a letter from Dr Milus Glazier about his reported noncompliance with his statin.  He insists that he takes it daily without fail.   Patient Active Problem List  Diagnosis  . Cardiac murmur  . ED (erectile dysfunction)  . Tinnitus of both ears  . Macular degeneration  . Hernia  . AR (allergic rhinitis)  . Mixed hyperlipidemia  . Diverticulosis of sigmoid colon    Past Medical History  Diagnosis Date  . Arthritis     bilateral thumbs  . Allergy   . Cardiac murmur 1996    normal ECHO  . Hyperlipidemia   . ED (erectile dysfunction) 2006  . H/O ETOH abuse     Quit 2006  . Tinnitus of both ears   . BCC (basal cell carcinoma of skin) 04/2006    LEFT low back  . Ulcer   . Macular degeneration   . Diverticulosis   . Hernia     RIGHT inguinal    Past Surgical History  Procedure Laterality Date  . Ganglion cyst excision  1970's  . Thumb fusion      BILATERAL  . Lumbar fusion      L3-4; Dr. Danielle Dess  . Vasectomy  1976  . Spine surgery      History  Substance Use Topics  . Smoking status: Former Games developer  . Smokeless tobacco: Current User    Types:  Chew  . Alcohol Use: No    Family History  Problem Relation Age of Onset  . Atrial fibrillation Brother   . Heart disease Brother   . Heart disease Father   . Cancer Father     Prostate  . Stroke Son 35    Allergies  Allergen Reactions  . Codeine Other (See Comments)    Dizziness; faint  . Methocarbamol Other (See Comments)    HA, tinnitus  . Morphine And Related Other (See Comments)    Severe Headache  . Nsaids     BRBPR    Medication list has been reviewed and updated.  Current Outpatient Prescriptions on File Prior to Visit  Medication Sig Dispense Refill  . azelastine (ASTELIN) 137 MCG/SPRAY nasal spray Place 1 spray into the nose 2 (two) times daily. Use in each nostril as directed  30 mL  12  . diazepam (VALIUM) 10 MG tablet Take 1 tablet (10 mg total) by mouth every 8 (eight) hours as needed (tinnitus).  30 tablet  0  . ezetimibe-simvastatin (VYTORIN) 10-40 MG per tablet Take 1 tablet by mouth at bedtime.  30 tablet  5  . fish oil-omega-3 fatty acids 1000 MG capsule Take  2 g by mouth daily.      Marland Kitchen ibuprofen (ADVIL,MOTRIN) 800 MG tablet Take 1 tablet (800 mg total) by mouth every 8 (eight) hours as needed for pain.  30 tablet  1  . cyclobenzaprine (FLEXERIL) 10 MG tablet Take 1 tablet (10 mg total) by mouth 3 (three) times daily as needed for muscle spasms.  30 tablet  5  . vardenafil (LEVITRA) 10 MG tablet Take 1 tablet (10 mg total) by mouth daily as needed for erectile dysfunction.  10 tablet  5   No current facility-administered medications on file prior to visit.    Review of Systems:  As per HPI, otherwise negative.    Physical Examination: Filed Vitals:   09/23/12 0917  BP: 134/90  Pulse: 108  Temp: 98.1 F (36.7 C)  Resp: 16   Filed Vitals:   09/23/12 0917  Height: 5\' 11"  (1.803 m)  Weight: 242 lb (109.77 kg)   Body mass index is 33.77 kg/(m^2). Ideal Body Weight: Weight in (lb) to have BMI = 25: 178.9  GEN: WDWN, NAD, Non-toxic, A & O x  3 HEENT: Atraumatic, Normocephalic. Neck supple. No masses, No LAD. Ears and Nose: No external deformity. CV: RRR, No M/G/R. No JVD. No thrill. No extra heart sounds. PULM: CTA B, no wheezes, crackles, rhonchi. No retractions. No resp. distress. No accessory muscle use. ABD: S, NT, ND, +BS. No rebound. No HSM. EXTR: No c/c/e NEURO Normal gait.  PSYCH: Normally interactive. Conversant. Not depressed or anxious appearing.  Calm demeanor.  BACK:  Tender lumbar paraspinous muscle on right neuro intact  Assessment and Plan: Lumbar strain Sciatic neuritis Anaprox Flexeril vicodin Follow up with Dr Milus Glazier as requested. Signed,  Phillips Odor, MD

## 2012-09-23 NOTE — Patient Instructions (Addendum)
Back Pain, Adult Low back pain is very common. About 1 in 5 people have back pain.The cause of low back pain is rarely dangerous. The pain often gets better over time.About half of people with a sudden onset of back pain feel better in just 2 weeks. About 8 in 10 people feel better by 6 weeks.  CAUSES Some common causes of back pain include:  Strain of the muscles or ligaments supporting the spine.  Wear and tear (degeneration) of the spinal discs.  Arthritis.  Direct injury to the back. DIAGNOSIS Most of the time, the direct cause of low back pain is not known.However, back pain can be treated effectively even when the exact cause of the pain is unknown.Answering your caregiver's questions about your overall health and symptoms is one of the most accurate ways to make sure the cause of your pain is not dangerous. If your caregiver needs more information, he or she may order lab work or imaging tests (X-rays or MRIs).However, even if imaging tests show changes in your back, this usually does not require surgery. HOME CARE INSTRUCTIONS For many people, back pain returns.Since low back pain is rarely dangerous, it is often a condition that people can learn to manageon their own.   Remain active. It is stressful on the back to sit or stand in one place. Do not sit, drive, or stand in one place for more than 30 minutes at a time. Take short walks on level surfaces as soon as pain allows.Try to increase the length of time you walk each day.  Do not stay in bed.Resting more than 1 or 2 days can delay your recovery.  Do not avoid exercise or work.Your body is made to move.It is not dangerous to be active, even though your back may hurt.Your back will likely heal faster if you return to being active before your pain is gone.  Pay attention to your body when you bend and lift. Many people have less discomfortwhen lifting if they bend their knees, keep the load close to their bodies,and  avoid twisting. Often, the most comfortable positions are those that put less stress on your recovering back.  Find a comfortable position to sleep. Use a firm mattress and lie on your side with your knees slightly bent. If you lie on your back, put a pillow under your knees.  Only take over-the-counter or prescription medicines as directed by your caregiver. Over-the-counter medicines to reduce pain and inflammation are often the most helpful.Your caregiver may prescribe muscle relaxant drugs.These medicines help dull your pain so you can more quickly return to your normal activities and healthy exercise.  Put ice on the injured area.  Put ice in a plastic bag.  Place a towel between your skin and the bag.  Leave the ice on for 15 to 20 minutes, 3 to 4 times a day for the first 2 to 3 days. After that, ice and heat may be alternated to reduce pain and spasms.  Ask your caregiver about trying back exercises and gentle massage. This may be of some benefit.  Avoid feeling anxious or stressed.Stress increases muscle tension and can worsen back pain.It is important to recognize when you are anxious or stressed and learn ways to manage it.Exercise is a great option. SEEK MEDICAL CARE IF:  You have pain that is not relieved with rest or medicine.  You have pain that does not improve in 1 week.  You have new symptoms.  You are generally   not feeling well. SEEK IMMEDIATE MEDICAL CARE IF:   You have pain that radiates from your back into your legs.  You develop new bowel or bladder control problems.  You have unusual weakness or numbness in your arms or legs.  You develop nausea or vomiting.  You develop abdominal pain.  You feel faint. Document Released: 06/12/2005 Document Revised: 12/12/2011 Document Reviewed: 10/31/2010 [employer] Patient Information 2013 [employer].  

## 2012-09-24 NOTE — Progress Notes (Signed)
Reviewed and agree.

## 2012-11-01 ENCOUNTER — Other Ambulatory Visit: Payer: Self-pay | Admitting: Emergency Medicine

## 2012-11-01 ENCOUNTER — Other Ambulatory Visit: Payer: Self-pay | Admitting: Physician Assistant

## 2013-01-06 ENCOUNTER — Encounter: Payer: Self-pay | Admitting: Physician Assistant

## 2013-02-10 ENCOUNTER — Other Ambulatory Visit: Payer: Self-pay | Admitting: Physician Assistant

## 2013-02-13 ENCOUNTER — Other Ambulatory Visit: Payer: Self-pay

## 2013-02-18 ENCOUNTER — Ambulatory Visit (INDEPENDENT_AMBULATORY_CARE_PROVIDER_SITE_OTHER): Payer: 59 | Admitting: Physician Assistant

## 2013-02-18 ENCOUNTER — Encounter: Payer: Self-pay | Admitting: Physician Assistant

## 2013-02-18 VITALS — BP 132/82 | HR 68 | Temp 98.1°F | Resp 16 | Ht 71.0 in | Wt 245.0 lb

## 2013-02-18 DIAGNOSIS — M159 Polyosteoarthritis, unspecified: Secondary | ICD-10-CM

## 2013-02-18 DIAGNOSIS — H9313 Tinnitus, bilateral: Secondary | ICD-10-CM

## 2013-02-18 DIAGNOSIS — H9319 Tinnitus, unspecified ear: Secondary | ICD-10-CM

## 2013-02-18 DIAGNOSIS — E782 Mixed hyperlipidemia: Secondary | ICD-10-CM

## 2013-02-18 LAB — LIPID PANEL
HDL: 38 mg/dL — ABNORMAL LOW (ref >39)
Total CHOL/HDL Ratio: 3.9 Ratio
VLDL: 29 mg/dL (ref 0–40)

## 2013-02-18 LAB — COMPREHENSIVE METABOLIC PANEL
ALT: 21 U/L (ref 0–53)
AST: 18 U/L (ref 0–37)
Alkaline Phosphatase: 71 U/L (ref 39–117)
BUN: 10 mg/dL (ref 6–23)
Calcium: 9.1 mg/dL (ref 8.4–10.5)
Creat: 0.88 mg/dL (ref 0.50–1.35)
Total Bilirubin: 0.5 mg/dL (ref 0.3–1.2)

## 2013-02-18 MED ORDER — EZETIMIBE-SIMVASTATIN 10-40 MG PO TABS: 1.0000 | ORAL_TABLET | Freq: Every day | ORAL | Status: DC

## 2013-02-18 MED ORDER — MELOXICAM 15 MG PO TABS: 15.0000 mg | ORAL_TABLET | Freq: Every day | ORAL | Status: DC

## 2013-02-18 MED ORDER — DIAZEPAM 10 MG PO TABS: ORAL_TABLET | ORAL | Status: DC

## 2013-02-18 MED ORDER — DIAZEPAM 10 MG PO TABS: 10.0000 mg | ORAL_TABLET | Freq: Three times a day (TID) | ORAL | Status: DC | PRN

## 2013-02-18 NOTE — Patient Instructions (Addendum)
Healthy eating and regular exercise can help you lose weight. Call Dr. Verlee Rossetti office to schedule an appointment for your back. Stop the ibuprofen.  Take Meloxicam (Mobic) instead.

## 2013-02-18 NOTE — Progress Notes (Signed)
  Subjective:    Patient ID: Craig Roberson, male    DOB: [birth year], 57 y.o.   MRN: [id number]  HPI This 56 y.o. male presents for evaluation of hyperlipidemia (has had coffee with sugar this morning) and chronic bilateral tinnitus.  He's doing well overall, but notes that his low back pain and associated paresthesias of the RIGHT leg are worsening again.  He has had good results with an injection by Dr. Danielle Dess in the past.  He has also noted some swelling of the RIGHT ankle for the past couple of months.  No pain.  Definitely worse in the evenings, but can be present in the mornings.  Medications, allergies, past medical history, surgical history, family history, social history and problem list reviewed.   Review of Systems Denies chest pain, shortness of breath, HA, dizziness, vision change, nausea, vomiting, diarrhea, constipation, melena, hematochezia, dysuria, increased urinary urgency or frequency, increased hunger or thirst, unintentional weight change, rash.     Objective:   Physical Exam  Blood pressure 132/82, pulse 68, temperature 98.1 F (36.7 C), temperature source Oral, resp. rate 16, height 5\' 11"  (1.803 m), weight 245 lb (111.131 kg), SpO2 97.00%. Body mass index is 34.19 kg/(m^2). Well-developed, well nourished WM who is awake, alert and oriented, in NAD. HEENT: [place]/AT, PERRL, EOMI.  Sclera and conjunctiva are clear.  EAC are patent, TMs are normal in appearance. Nasal mucosa is pink and moist. OP is clear. Neck: supple, non-tender, no lymphadenopathy, thyromegaly. No carotid bruits. Heart: RRR, no murmur Lungs: normal effort, CTA Abdomen: normo-active bowel sounds, supple, non-tender, no mass or organomegaly. Extremities: no cyanosis, clubbing.  No erythema and no increased warmth. Minimal venous stasis changes of the skin of the RIGHT lower extremity. Trace-1+ edema of the RIGHT lower extremity.  No varicosities.  No palpable cord. Normal pedal pulses. Skin: warm and  dry without rash. Psychologic: good mood and appropriate affect, normal speech and behavior.       Assessment & Plan:  Mixed hyperlipidemia - Plan: ezetimibe-simvastatin (VYTORIN) 10-40 MG per tablet, Comprehensive metabolic panel, Lipid panel  Tinnitus of both ears - Plan: diazepam (VALIUM) 10 MG tablet  DJD (degenerative joint disease), multiple sites - Plan: meloxicam (MOBIC) 15 MG tablet; He'll contact Dr. Verlee Rossetti office regarding the possibility of another injection.  RIGHT LE Edema - possibly related to his low back; elect not to start a diuretic yet and he's not interested in compression stockings at this time.  Fernande Bras, PA-C Physician Assistant-Certified [facility]

## 2013-02-19 ENCOUNTER — Encounter: Payer: Self-pay | Admitting: Physician Assistant

## 2013-03-25 ENCOUNTER — Other Ambulatory Visit: Payer: Self-pay | Admitting: Physician Assistant

## 2013-03-29 ENCOUNTER — Other Ambulatory Visit: Payer: Self-pay | Admitting: Physician Assistant

## 2013-04-02 ENCOUNTER — Other Ambulatory Visit: Payer: Self-pay | Admitting: Physician Assistant

## 2013-06-02 ENCOUNTER — Other Ambulatory Visit: Payer: Self-pay | Admitting: Physician Assistant

## 2013-06-03 ENCOUNTER — Other Ambulatory Visit: Payer: Self-pay

## 2013-09-02 ENCOUNTER — Ambulatory Visit (INDEPENDENT_AMBULATORY_CARE_PROVIDER_SITE_OTHER): Payer: 59 | Admitting: Physician Assistant

## 2013-09-02 ENCOUNTER — Encounter: Payer: Self-pay | Admitting: Physician Assistant

## 2013-09-02 VITALS — BP 112/76 | HR 75 | Temp 97.9°F | Resp 16 | Ht 71.0 in | Wt 246.2 lb

## 2013-09-02 DIAGNOSIS — H9319 Tinnitus, unspecified ear: Secondary | ICD-10-CM

## 2013-09-02 DIAGNOSIS — Z1159 Encounter for screening for other viral diseases: Secondary | ICD-10-CM

## 2013-09-02 DIAGNOSIS — Z1211 Encounter for screening for malignant neoplasm of colon: Secondary | ICD-10-CM

## 2013-09-02 DIAGNOSIS — Z125 Encounter for screening for malignant neoplasm of prostate: Secondary | ICD-10-CM

## 2013-09-02 DIAGNOSIS — R635 Abnormal weight gain: Secondary | ICD-10-CM

## 2013-09-02 DIAGNOSIS — N529 Male erectile dysfunction, unspecified: Secondary | ICD-10-CM

## 2013-09-02 DIAGNOSIS — E669 Obesity, unspecified: Secondary | ICD-10-CM | POA: Insufficient documentation

## 2013-09-02 DIAGNOSIS — Z23 Encounter for immunization: Secondary | ICD-10-CM

## 2013-09-02 DIAGNOSIS — Z0000 Encounter for general adult medical examination without abnormal findings: Secondary | ICD-10-CM

## 2013-09-02 DIAGNOSIS — E782 Mixed hyperlipidemia: Secondary | ICD-10-CM

## 2013-09-02 DIAGNOSIS — H9313 Tinnitus, bilateral: Secondary | ICD-10-CM

## 2013-09-02 DIAGNOSIS — M159 Polyosteoarthritis, unspecified: Secondary | ICD-10-CM

## 2013-09-02 LAB — CBC WITH DIFFERENTIAL/PLATELET
BASOS ABS: 0 10*3/uL (ref 0.0–0.1)
Basophils Relative: 0 % (ref 0–1)
Eosinophils Absolute: 0.3 10*3/uL (ref 0.0–0.7)
Eosinophils Relative: 4 % (ref 0–5)
HEMATOCRIT: 45 % (ref 39.0–52.0)
HEMOGLOBIN: 15.8 g/dL (ref 13.0–17.0)
Lymphocytes Relative: 28 % (ref 12–46)
Lymphs Abs: 2.1 10*3/uL (ref 0.7–4.0)
MCH: 31.9 pg (ref 26.0–34.0)
MCHC: 35.1 g/dL (ref 30.0–36.0)
MCV: 90.7 fL (ref 78.0–100.0)
MONOS PCT: 11 % (ref 3–12)
Monocytes Absolute: 0.8 10*3/uL (ref 0.1–1.0)
NEUTROS ABS: 4.3 10*3/uL (ref 1.7–7.7)
Neutrophils Relative %: 57 % (ref 43–77)
Platelets: 326 10*3/uL (ref 150–400)
RBC: 4.96 MIL/uL (ref 4.22–5.81)
RDW: 13.9 % (ref 11.5–15.5)
WBC: 7.5 10*3/uL (ref 4.0–10.5)

## 2013-09-02 LAB — COMPREHENSIVE METABOLIC PANEL
ALBUMIN: 4.6 g/dL (ref 3.5–5.2)
ALT: 25 U/L (ref 0–53)
AST: 19 U/L (ref 0–37)
Alkaline Phosphatase: 73 U/L (ref 39–117)
BUN: 11 mg/dL (ref 6–23)
CALCIUM: 9.1 mg/dL (ref 8.4–10.5)
CHLORIDE: 103 meq/L (ref 96–112)
CO2: 27 mEq/L (ref 19–32)
Creat: 0.99 mg/dL (ref 0.50–1.35)
Glucose, Bld: 101 mg/dL — ABNORMAL HIGH (ref 70–99)
POTASSIUM: 4.4 meq/L (ref 3.5–5.3)
Sodium: 140 mEq/L (ref 135–145)
Total Bilirubin: 0.7 mg/dL (ref 0.2–1.2)
Total Protein: 7.4 g/dL (ref 6.0–8.3)

## 2013-09-02 LAB — POCT URINALYSIS DIPSTICK
BILIRUBIN UA: NEGATIVE
Blood, UA: NEGATIVE
Glucose, UA: NEGATIVE
KETONES UA: NEGATIVE
LEUKOCYTES UA: NEGATIVE
Nitrite, UA: NEGATIVE
Protein, UA: NEGATIVE
Spec Grav, UA: 1.025
Urobilinogen, UA: 0.2
pH, UA: 6

## 2013-09-02 LAB — LIPID PANEL
CHOLESTEROL: 180 mg/dL (ref 0–200)
HDL: 46 mg/dL (ref >39)
LDL Cholesterol: 114 mg/dL — ABNORMAL HIGH (ref 0–99)
Total CHOL/HDL Ratio: 3.9 Ratio
Triglycerides: 100 mg/dL (ref <150)
VLDL: 20 mg/dL (ref 0–40)

## 2013-09-02 LAB — POCT UA - MICROSCOPIC ONLY
Bacteria, U Microscopic: 1
Casts, Ur, LPF, POC: NEGATIVE
Crystals, Ur, HPF, POC: NEGATIVE
Epithelial cells, urine per micros: NEGATIVE
Mucus, UA: POSITIVE
Yeast, UA: NEGATIVE

## 2013-09-02 LAB — IFOBT (OCCULT BLOOD): IMMUNOLOGICAL FECAL OCCULT BLOOD TEST: POSITIVE

## 2013-09-02 LAB — TSH: TSH: 1.835 u[IU]/mL (ref 0.350–4.500)

## 2013-09-02 LAB — HEPATITIS C ANTIBODY: HCV Ab: NEGATIVE

## 2013-09-02 LAB — PSA: PSA: 0.45 ng/mL (ref <=4.00)

## 2013-09-02 MED ORDER — DIAZEPAM 10 MG PO TABS: ORAL_TABLET | ORAL | Status: DC

## 2013-09-02 MED ORDER — EZETIMIBE-SIMVASTATIN 10-40 MG PO TABS: 1.0000 | ORAL_TABLET | Freq: Every day | ORAL | Status: DC

## 2013-09-02 MED ORDER — MELOXICAM 15 MG PO TABS: 15.0000 mg | ORAL_TABLET | Freq: Every day | ORAL | Status: DC

## 2013-09-02 NOTE — Progress Notes (Signed)
Subjective:    Patient ID: Craig Roberson, male    DOB: February 01, 1956, 58 y.o.   MRN: [id number]   PCP: Koury Roddy, PA-C  Chief Complaint  Patient presents with  . Annual Exam      Active Ambulatory Problems    Diagnosis Date Noted  . Cardiac murmur   . ED (erectile dysfunction)   . Tinnitus of both ears   . Macular degeneration   . Hernia   . AR (allergic rhinitis) 03/19/2012  . Mixed hyperlipidemia 03/19/2012  . Diverticulosis of sigmoid colon 08/20/2012  . DJD (degenerative joint disease), multiple sites 02/18/2013   Resolved Ambulatory Problems    Diagnosis Date Noted  . No Resolved Ambulatory Problems   Past Medical History  Diagnosis Date  . Arthritis   . Allergy   . Hyperlipidemia   . H/O ETOH abuse   . BCC (basal cell carcinoma of skin) 04/2006  . Ulcer   . Diverticulosis     Past Surgical History  Procedure Laterality Date  . Ganglion cyst excision  1970's  . Thumb fusion      BILATERAL  . Lumbar fusion      L3-4; Dr. Ellene Route  . Vasectomy  1976  . Spine surgery      Allergies  Allergen Reactions  . Codeine Other (See Comments)    Dizziness; faint  . Methocarbamol Other (See Comments)    HA, tinnitus  . Morphine And Related Other (See Comments)    Severe Headache  . Nsaids     BRBPR    Prior to Admission medications   Medication Sig Start Date End Date Taking? Authorizing Provider  cholecalciferol (VITAMIN D) 1000 UNITS tablet Take 1,000 Units by mouth daily.   Yes Historical Provider, MD  cyclobenzaprine (FLEXERIL) 10 MG tablet Take 1 tablet (10 mg total) by mouth 3 (three) times daily as needed for muscle spasms. 09/23/12  Yes Ellison Carwin, MD  diazepam (VALIUM) 10 MG tablet TAKE 1 TABLET BY MOUTH EVERY 8 HOURS AS NEEDED. May fill 30 days after date on prescription. 02/18/13  Yes Shedric Fredericks S Eashan Schipani, PA-C  diazepam (VALIUM) 10 MG tablet TAKE 1 TABLET BY MOUTH EVERY 8 HOURS AS NEEDED. May fill 60 days after date on prescription.  02/18/13  Yes Tulsi Crossett S Aldous Housel, PA-C  diazepam (VALIUM) 10 MG tablet TAKE 1 TABLET EVERY 8 HOURS AS NEEDED 06/02/13  Yes Kimani Bedoya S Roch Quach, PA-C  ezetimibe-simvastatin (VYTORIN) 10-40 MG per tablet Take 1 tablet by mouth at bedtime. 02/18/13  Yes Zulema Pulaski S Shadaya Marschner, PA-C  fish oil-omega-3 fatty acids 1000 MG capsule Take 2 g by mouth daily.   Yes Historical Provider, MD  meloxicam (MOBIC) 15 MG tablet Take 1 tablet (15 mg total) by mouth daily. 02/18/13  Yes Norfleet Capers S Rome Echavarria, PA-C  azelastine (ASTELIN) 137 MCG/SPRAY nasal spray Place 1 spray into the nose 2 (two) times daily. Use in each nostril as directed 08/20/12 08/20/13  Garreth Burnsworth S Besan Ketchem, PA-C  vardenafil (LEVITRA) 10 MG tablet Take 1 tablet (10 mg total) by mouth daily as needed for erectile dysfunction. 03/19/12   Fara Chute, PA-C    History   Social History  . Marital Status: Married    Spouse Name: [name]    Number of Children: 2  . Years of Education: 8   Occupational History  . Retired     Careers information officer   Social History Main Topics  . Smoking status: Former Research scientist (life sciences)  . Smokeless tobacco: Current User  Types: Chew     Comment: some days  . Alcohol Use: No  . Drug Use: No  . Sexual Activity: Not Currently    Partners: Female     Comment: family stress; his back pain have reduced the frequency   Other Topics Concern  . None   Social History Narrative   Married to 3rd wife.    family history includes Atrial fibrillation in his brother; Cancer in his father; Heart disease in his brother and father; Hyperlipidemia in his mother; Stroke (age of onset: 13) in his son. indicated that his mother is alive. He indicated that his father is deceased. He indicated that all of his three brothers are alive. He indicated that his maternal grandmother is deceased. He indicated that his maternal grandfather is deceased. He indicated that his paternal grandmother is deceased. He indicated that his paternal grandfather is  deceased. He indicated that his daughter is alive. He indicated that his son is alive.   HPI  As above. No new problems.  Reports that he continues to have numbness in the RIGHT foot and ankle.  This improved significantly after the last series of injections with Dr. Ellene Route.  Colonoscopy 2011, repeat in 2021.  Did not get a flu vaccine this season, but is open to having one today.  Tdap is current.  Review of Systems  Constitutional: Negative.   HENT: Positive for tinnitus (chronic). Negative for congestion, dental problem, drooling, ear discharge, ear pain, facial swelling, hearing loss, mouth sores, nosebleeds, postnasal drip, rhinorrhea, sinus pressure, sneezing, sore throat, trouble swallowing and voice change.   Eyes: Negative.   Respiratory: Negative.   Cardiovascular: Negative.   Gastrointestinal: Negative.   Endocrine: Negative.   Genitourinary: Negative.        RIGHT inguinal hernia  Musculoskeletal: Positive for back pain.  Skin: Negative.   Allergic/Immunologic: Negative.   Neurological: Negative.   Hematological: Negative.   Psychiatric/Behavioral: Negative.        Objective:   Physical Exam  Constitutional: He is oriented to person, place, and time. Vital signs are normal. He appears well-developed and well-nourished. He is active and cooperative.  Non-toxic appearance. He does not have a sickly appearance. He does not appear ill. No distress.  BP 112/76  Pulse 75  Temp(Src) 97.9 F (36.6 C) (Oral)  Resp 16  Ht 5\' 11"  (1.803 m)  Wt 246 lb 3.2 oz (111.676 kg)  BMI 34.35 kg/m2  SpO2 96%   HENT:  Head: Normocephalic and atraumatic.  Right Ear: Hearing, tympanic membrane, external ear and ear canal normal.  Left Ear: Hearing, tympanic membrane, external ear and ear canal normal.  Nose: Nose normal.  Mouth/Throat: Uvula is midline, oropharynx is clear and moist and mucous membranes are normal. He does not have dentures. No oral lesions. No trismus in the jaw.  Normal dentition. No dental abscesses, uvula swelling, lacerations or dental caries.  Eyes: Conjunctivae, EOM and lids are normal. Pupils are equal, round, and reactive to light. Right eye exhibits no discharge. Left eye exhibits no discharge. No scleral icterus.  Fundoscopic exam:      The right eye shows no arteriolar narrowing, no AV nicking, no exudate, no hemorrhage and no papilledema.       The left eye shows no arteriolar narrowing, no AV nicking, no exudate, no hemorrhage and no papilledema.  Neck: Normal range of motion, full passive range of motion without pain and phonation normal. Neck supple. No spinous process tenderness and no muscular  tenderness present. No rigidity. No tracheal deviation, no edema, no erythema and normal range of motion present. No thyromegaly present.  Cardiovascular: Normal rate, regular rhythm, S1 normal, S2 normal, normal heart sounds, intact distal pulses and normal pulses.  Exam reveals no gallop and no friction rub.   No murmur heard. Pulmonary/Chest: Effort normal and breath sounds normal. No respiratory distress. He has no wheezes. He has no rales.  Abdominal: Soft. Normal appearance and bowel sounds are normal. He exhibits no distension and no mass. There is no hepatosplenomegaly. There is no tenderness. There is no rebound and no guarding. A hernia is present. Hernia confirmed positive in the right inguinal area. Hernia confirmed negative in the left inguinal area.  Genitourinary: Rectum normal, prostate normal, testes normal and penis normal. Guaiac negative stool.    No phimosis, paraphimosis, hypospadias, penile erythema or penile tenderness. No discharge found.  Musculoskeletal: Normal range of motion. He exhibits no edema and no tenderness.       Right knee: Normal.       Left knee: Normal.       Right ankle: Achilles tendon normal.       Left ankle: Achilles tendon normal.       Cervical back: Normal. He exhibits normal range of motion, no  tenderness, no bony tenderness, no swelling, no edema, no deformity, no laceration, no pain, no spasm and normal pulse.       Thoracic back: Normal.       Lumbar back: Normal.  Lymphadenopathy:       Head (right side): No submental, no submandibular, no tonsillar, no preauricular, no posterior auricular and no occipital adenopathy present.       Head (left side): No submental, no submandibular, no tonsillar, no preauricular, no posterior auricular and no occipital adenopathy present.    He has no cervical adenopathy.       Right: No inguinal and no supraclavicular adenopathy present.       Left: No inguinal and no supraclavicular adenopathy present.  Neurological: He is alert and oriented to person, place, and time. He has normal strength and normal reflexes. He displays no tremor. No cranial nerve deficit. He exhibits normal muscle tone. Coordination and gait normal.  Skin: Skin is warm, dry and intact. No abrasion, no ecchymosis, no laceration, no lesion and no rash noted. He is not diaphoretic. No cyanosis or erythema. No pallor. Nails show no clubbing.  Psychiatric: He has a normal mood and affect. His speech is normal and behavior is normal. Judgment and thought content normal. Cognition and memory are normal.          Assessment & Plan:  1. Routine general medical examination at a health care facility Age appropriate anticipatory guidance provided. - POCT UA - Microscopic Only - POCT urinalysis dipstick  2. DJD (degenerative joint disease), multiple sites - meloxicam (MOBIC) 15 MG tablet; Take 1 tablet (15 mg total) by mouth daily.  Dispense: 30 tablet; Refill: 5  3. Mixed hyperlipidemia - ezetimibe-simvastatin (VYTORIN) 10-40 MG per tablet; Take 1 tablet by mouth at bedtime.  Dispense: 30 tablet; Refill: 5  4. Tinnitus of both ears - diazepam (VALIUM) 10 MG tablet; TAKE 1 TABLET BY MOUTH EVERY 8 HOURS AS NEEDED. May fill 30 days after date on prescription.  Dispense: 30 tablet;  Refill: 0 - diazepam (VALIUM) 10 MG tablet; TAKE 1 TABLET BY MOUTH EVERY 8 HOURS AS NEEDED. May fill 60 days after date on prescription.  Dispense: 30 tablet;  Refill: 0 - diazepam (VALIUM) 10 MG tablet; TAKE 1 TABLET EVERY 8 HOURS AS NEEDED  Dispense: 30 tablet; Refill: 0  5. ED (erectile dysfunction) Not currently sexually active, so doesn't take medication for this.  6. Weight gain Needs increased exercise and decrease calories - CBC with Differential - Comprehensive metabolic panel - Lipid panel - TSH  7. Screening for prostate cancer - PSA  8. Need for hepatitis C screening test - Hepatitis C antibody  9. Need for influenza vaccination - Flu Vaccine QUAD 36+ mos IM  10. Screening for colon cancer - IFOBT POC (occult bld, rslt in office)   Fara Chute, PA-C Physician Assistant-Certified [facility]

## 2013-09-02 NOTE — Patient Instructions (Addendum)

## 2013-09-07 ENCOUNTER — Encounter: Payer: Self-pay | Admitting: Physician Assistant

## 2014-03-02 ENCOUNTER — Other Ambulatory Visit: Payer: Self-pay | Admitting: Physician Assistant

## 2014-03-04 NOTE — Telephone Encounter (Signed)
Faxed

## 2014-03-17 ENCOUNTER — Ambulatory Visit: Payer: 59 | Admitting: Physician Assistant

## 2014-03-18 ENCOUNTER — Ambulatory Visit (INDEPENDENT_AMBULATORY_CARE_PROVIDER_SITE_OTHER): Payer: 59 | Admitting: Family Medicine

## 2014-03-18 ENCOUNTER — Encounter: Payer: Self-pay | Admitting: Family Medicine

## 2014-03-18 VITALS — BP 112/80 | HR 67 | Temp 97.8°F | Resp 16 | Ht 71.5 in | Wt 241.4 lb

## 2014-03-18 DIAGNOSIS — Z23 Encounter for immunization: Secondary | ICD-10-CM

## 2014-03-18 DIAGNOSIS — M19042 Primary osteoarthritis, left hand: Secondary | ICD-10-CM

## 2014-03-18 DIAGNOSIS — H9313 Tinnitus, bilateral: Secondary | ICD-10-CM

## 2014-03-18 DIAGNOSIS — M5430 Sciatica, unspecified side: Secondary | ICD-10-CM

## 2014-03-18 DIAGNOSIS — H9319 Tinnitus, unspecified ear: Secondary | ICD-10-CM

## 2014-03-18 DIAGNOSIS — E782 Mixed hyperlipidemia: Secondary | ICD-10-CM

## 2014-03-18 DIAGNOSIS — R739 Hyperglycemia, unspecified: Secondary | ICD-10-CM

## 2014-03-18 DIAGNOSIS — M5441 Lumbago with sciatica, right side: Secondary | ICD-10-CM

## 2014-03-18 DIAGNOSIS — M19041 Primary osteoarthritis, right hand: Secondary | ICD-10-CM

## 2014-03-18 DIAGNOSIS — R7309 Other abnormal glucose: Secondary | ICD-10-CM

## 2014-03-18 DIAGNOSIS — M19049 Primary osteoarthritis, unspecified hand: Secondary | ICD-10-CM

## 2014-03-18 DIAGNOSIS — J309 Allergic rhinitis, unspecified: Secondary | ICD-10-CM

## 2014-03-18 DIAGNOSIS — M150 Primary generalized (osteo)arthritis: Secondary | ICD-10-CM

## 2014-03-18 DIAGNOSIS — M159 Polyosteoarthritis, unspecified: Secondary | ICD-10-CM

## 2014-03-18 LAB — CBC WITH DIFFERENTIAL/PLATELET
BASOS ABS: 0.1 10*3/uL (ref 0.0–0.1)
Basophils Relative: 1 % (ref 0–1)
EOS PCT: 4 % (ref 0–5)
Eosinophils Absolute: 0.3 10*3/uL (ref 0.0–0.7)
HCT: 46.7 % (ref 39.0–52.0)
Hemoglobin: 16.2 g/dL (ref 13.0–17.0)
Lymphocytes Relative: 34 % (ref 12–46)
Lymphs Abs: 2.3 10*3/uL (ref 0.7–4.0)
MCH: 31.4 pg (ref 26.0–34.0)
MCHC: 34.7 g/dL (ref 30.0–36.0)
MCV: 90.5 fL (ref 78.0–100.0)
MONO ABS: 0.7 10*3/uL (ref 0.1–1.0)
Monocytes Relative: 10 % (ref 3–12)
NEUTROS ABS: 3.4 10*3/uL (ref 1.7–7.7)
Neutrophils Relative %: 51 % (ref 43–77)
PLATELETS: 363 10*3/uL (ref 150–400)
RBC: 5.16 MIL/uL (ref 4.22–5.81)
RDW: 13.4 % (ref 11.5–15.5)
WBC: 6.7 10*3/uL (ref 4.0–10.5)

## 2014-03-18 LAB — LIPID PANEL
CHOLESTEROL: 149 mg/dL (ref 0–200)
HDL: 40 mg/dL (ref >39)
LDL Cholesterol: 85 mg/dL (ref 0–99)
TRIGLYCERIDES: 120 mg/dL (ref <150)
Total CHOL/HDL Ratio: 3.7 Ratio
VLDL: 24 mg/dL (ref 0–40)

## 2014-03-18 LAB — COMPLETE METABOLIC PANEL WITH GFR
ALK PHOS: 76 U/L (ref 39–117)
ALT: 27 U/L (ref 0–53)
AST: 22 U/L (ref 0–37)
Albumin: 4.5 g/dL (ref 3.5–5.2)
BUN: 15 mg/dL (ref 6–23)
CO2: 27 mEq/L (ref 19–32)
CREATININE: 0.94 mg/dL (ref 0.50–1.35)
Calcium: 9.2 mg/dL (ref 8.4–10.5)
Chloride: 103 mEq/L (ref 96–112)
GFR, Est African American: >89 mL/min
GFR, Est Non African American: 88 mL/min
Glucose, Bld: 106 mg/dL — ABNORMAL HIGH (ref 70–99)
Potassium: 4.3 mEq/L (ref 3.5–5.3)
Sodium: 138 mEq/L (ref 135–145)
Total Bilirubin: 0.5 mg/dL (ref 0.2–1.2)
Total Protein: 7.3 g/dL (ref 6.0–8.3)

## 2014-03-18 LAB — HEMOGLOBIN A1C
Hgb A1c MFr Bld: 6.3 % — ABNORMAL HIGH (ref <5.7)
Mean Plasma Glucose: 134 mg/dL — ABNORMAL HIGH (ref <117)

## 2014-03-18 MED ORDER — ZOSTER VACCINE LIVE 19400 UNT/0.65ML ~~LOC~~ SOLR: 0.6500 mL | Freq: Once | SUBCUTANEOUS | Status: DC

## 2014-03-18 MED ORDER — DIAZEPAM 10 MG PO TABS: ORAL_TABLET | ORAL | Status: DC

## 2014-03-18 MED ORDER — PREDNISONE 20 MG PO TABS: ORAL_TABLET | ORAL | Status: DC

## 2014-03-18 MED ORDER — EZETIMIBE-SIMVASTATIN 10-40 MG PO TABS: 1.0000 | ORAL_TABLET | Freq: Every day | ORAL | Status: DC

## 2014-03-18 MED ORDER — AZELASTINE HCL 0.1 % NA SOLN: 1.0000 | Freq: Two times a day (BID) | NASAL | Status: DC

## 2014-03-18 MED ORDER — MELOXICAM 15 MG PO TABS: 15.0000 mg | ORAL_TABLET | Freq: Every day | ORAL | Status: DC

## 2014-03-18 NOTE — Patient Instructions (Signed)
1.  Call Dr. Mariam Dollar today for an appointment due to worsening back pain. 2.  Take Prednisone for the next ten days; HOLD Meloxicam while taking Prednisone.

## 2014-03-18 NOTE — Progress Notes (Signed)
Subjective:    Patient ID: Craig Roberson, male    DOB: January 05, 1956, 57 y.o.   MRN: [id number]  This chart was scribed for Craig Honour, MD by Rosary Lively, ED scribe. This patient was seen in room Room/bed 21 and the patient's care was started at 8:18 AM.  HPI HPI Comments:  Craig Roberson is a 58 y.o. male who presents to [facility] as a regular pt of [name], PA-C. Pt reports that he is experiencing worsening back pain. Pt reports that his bowels leak when he experiences back pain, and that this occurs everyday; onset of these symptoms six months ago. Pt denies constipation. Pt denies urinary symptoms; no urinary retention or hesitancy. Pt reports that activity exacerbates back pain. He experiences numbness and swelling in the right ankle, onset 2 years ago, and pain that radiates bilaterally down the lower extremities, onset 6 months ago. Pt states that he did physical therapy for a while, but discontinued and found that he could complete the exercises on his own at home; however, they do not provide much relief. Pt reports that it is now very difficult for him to exercise due to pain symptoms. Pt reports that he takes Meloxicam for symptoms. Pt states that he has been given shots for back pain in previous years by Dr. Ellene Route. Pt states that the shots help for a while; however, the pain often returns. Pt reports that the only relief from his back pain recently, is to sit or lay down.  Pt reports that he had surgery in 2007.   Hand Arthritis: Pt reports that he is also experiencing arthritis in the right>left hand. Pt denies that he has seen an orthopedist for issue.   hypercholesterolemia: Pt reports that he would like to have his cholesterol checked.Patient reports good compliance with medication, good tolerance to medication, and good symptom control.    Ringing of both ears: Pt reports that he experiences ringing of both ears at all times. Pt reports that it is difficult for him  to hear higher pitches. This is a chronic issue for patient.  He is prescribed Valium for tinnitus and is due for a refill.  He also uses Valium for insomnia as needed.  Allergies: Pt reports that he takes Flonase for symptoms, and experiencing sinus pressure on R for the past day.  Pt accepts the flu vaccine. Pt accepts prescription for shingles vaccine.    Review of Systems  Constitutional: Negative for fever, chills, diaphoresis, activity change, appetite change and fatigue.  HENT: Positive for congestion, sinus pressure and tinnitus. Negative for ear pain, postnasal drip, rhinorrhea and sore throat.   Eyes: Negative for visual disturbance.  Respiratory: Negative for cough and shortness of breath.   Cardiovascular: Negative for chest pain, palpitations and leg swelling.  Gastrointestinal: Negative for nausea, vomiting, diarrhea and constipation.  Endocrine: Negative for cold intolerance, heat intolerance, polydipsia, polyphagia and polyuria.  Musculoskeletal: Positive for arthralgias, back pain, joint swelling and myalgias.  Neurological: Positive for numbness. Negative for dizziness, tremors, seizures, syncope, facial asymmetry, speech difficulty, weakness, light-headedness and headaches.  Psychiatric/Behavioral: Positive for sleep disturbance.   Past Medical History  Diagnosis Date  . Arthritis     bilateral thumbs  . Allergy   . Cardiac murmur 1996    normal ECHO  . Hyperlipidemia   . ED (erectile dysfunction) 2006  . H/O ETOH abuse     Quit 2006  . Tinnitus of both ears   . BCC (basal  cell carcinoma of skin) 04/2006    LEFT low back  . Ulcer   . Macular degeneration   . Diverticulosis   . Hernia     RIGHT inguinal   Allergies  Allergen Reactions  . Codeine Other (See Comments)    Dizziness; faint  . Methocarbamol Other (See Comments)    HA, tinnitus  . Morphine And Related Other (See Comments)    Severe Headache  . Nsaids     BRBPR       Objective:    Physical Exam  Nursing note and vitals reviewed. Constitutional: He is oriented to person, place, and time. He appears well-developed and well-nourished. No distress.  HENT:  Head: Normocephalic and atraumatic.  Right Ear: External ear normal.  Left Ear: External ear normal.  Nose: Nose normal.  Mouth/Throat: Oropharynx is clear and moist.  Eyes: Conjunctivae and EOM are normal. Pupils are equal, round, and reactive to light.  Neck: Normal range of motion. Neck supple. Carotid bruit is not present. No thyromegaly present.  Cardiovascular: Normal rate, regular rhythm, S1 normal, S2 normal, normal heart sounds and intact distal pulses.  Exam reveals no gallop and no friction rub.   No murmur heard. Pulmonary/Chest: Effort normal and breath sounds normal. He has no decreased breath sounds. He has no wheezes. He has no rhonchi. He has no rales.  Abdominal: Soft. Bowel sounds are normal. He exhibits no distension and no mass. There is tenderness in the right lower quadrant and left lower quadrant. There is no rebound and no guarding.  Musculoskeletal: Normal range of motion.       Lumbar back: He exhibits tenderness and pain. He exhibits normal range of motion and no bony tenderness.       Right hand: He exhibits tenderness and swelling. He exhibits normal range of motion.       Left hand: He exhibits tenderness and swelling. He exhibits normal range of motion.  Lumbar spine:  Straight leg raises negative; +Pain of lower back with extension and flexion. Motor 5/5 BLE.  Marching intact; toe and heel walking intact. B HANDS: mild swelling of first MCP joints B.  Lymphadenopathy:    He has no cervical adenopathy.  Neurological: He is alert and oriented to person, place, and time. No cranial nerve deficit.  Skin: Skin is warm and dry. No rash noted. He is not diaphoretic.  Psychiatric: He has a normal mood and affect. His behavior is normal.   INFLUENZA VACCINE ADMINISTERED.    Assessment & Plan:   Tinnitus of both ears - Plan: diazepam (VALIUM) 10 MG tablet  Mixed hyperlipidemia - Plan: ezetimibe-simvastatin (VYTORIN) 10-40 MG per tablet, CBC with Differential, Lipid panel  Allergic rhinitis, unspecified allergic rhinitis type - Plan: azelastine (ASTELIN) 0.1 % nasal spray  Primary osteoarthritis involving multiple joints - Plan: meloxicam (MOBIC) 15 MG tablet, CBC with Differential  Need for prophylactic vaccination and inoculation against influenza - Plan: Flu Vaccine QUAD 36+ mos IM  Bilateral low back pain with right-sided sciatica - Plan: predniSONE (DELTASONE) 20 MG tablet  Osteoarthritis of both hands, unspecified osteoarthritis type - Plan: Ambulatory referral to Orthopedic Surgery, CBC with Differential  Need for shingles vaccine - Plan: zoster vaccine live, PF, (ZOSTAVAX) 97353 UNT/0.65ML injection  Hyperglycemia - Plan: CBC with Differential, COMPLETE METABOLIC PANEL WITH GFR, Hemoglobin A1c  1.  Tinnitus B ears: Chronic; refill of Valium provided.   2.  Hyperlipidemia: controlled; obtain labs; refill of Vytorin provided. 3.  Allergic Rhinitis: worsening;  refill of Astelin provided. 4.  DDD lumbar spine with radiculopathy: worsening; with intermittent fecal leakage; rx for Prednisone provided; advised to hold Meloxicam while taking Prednisone.  Pt advised to call Dr. Clarice Pole office today for an appointment.  Pt expressed understanding. 5.  B OA Hands: persistent; refill of Meloxicam provided; refer to ortho per patient request. 6.  Hyperglycemia: new at last visit with glucose of 101; obtain HgbA1c. 7.  Rx for shingles vaccine provided; advised to check with insurance regarding coverage and appropriate location to have administered.  Meds ordered this encounter  Medications  . azelastine (ASTELIN) 0.1 % nasal spray    Sig: Place 1 spray into both nostrils 2 (two) times daily. Use in each nostril as directed    Dispense:  30 mL    Refill:  12  .  ezetimibe-simvastatin (VYTORIN) 10-40 MG per tablet    Sig: Take 1 tablet by mouth at bedtime.    Dispense:  30 tablet    Refill:  5  . meloxicam (MOBIC) 15 MG tablet    Sig: Take 1 tablet (15 mg total) by mouth daily.    Dispense:  30 tablet    Refill:  5  . diazepam (VALIUM) 10 MG tablet    Sig: TAKE 1 TABLET BY MOUTH EVERY 8 HOURS AS NEEDED. May fill 60 days after date on prescription.    Dispense:  30 tablet    Refill:  0  . predniSONE (DELTASONE) 20 MG tablet    Sig: Three tablets daily x 1 day then two tablets daily x 5 days then one tablet daily x 5 days    Dispense:  18 tablet    Refill:  0  . zoster vaccine live, PF, (ZOSTAVAX) 84665 UNT/0.65ML injection    Sig: Inject 19,400 Units into the skin once.    Dispense:  0.65 mL    Refill:  0   I personally performed the services described in this documentation, which was scribed in my presence.  The recorded information has been reviewed and is accurate.  Reginia Forts, M.D.  [facility] [address] [place], [place]  [address] [phone] phone [phone] fax

## 2014-03-20 ENCOUNTER — Telehealth: Payer: Self-pay

## 2014-03-20 NOTE — Telephone Encounter (Signed)
Pt is not clear as to why we are sending him to [place] when he already is gong to dr Amedeo Plenty for his osteo and dr Arnoldo Morale neuro please call

## 2014-03-21 NOTE — Telephone Encounter (Signed)
Note states : Hand Arthritis: Pt reports that he is also experiencing arthritis in the right>left hand. Pt denies that he has seen an orthopedist for issue. 5. B OA Hands: persistent; refill of Meloxicam provided; refer to ortho per patient request.  He can continue with Dr Amedeo Plenty, no need to see [facility], he stated he wanted referral to ortho, and stated he did not already have one.   As for his back note indicates he has DDD lumbar spine with radiculopathy: worsening; with intermittent fecal leakage; rx for Prednisone provided; advised to hold Meloxicam while taking Prednisone. Pt advised to call Dr. Clarice Pole office today for an appointment. Pt expressed understanding.  Patient can call Neurosurgeon, they have already seen him.  Does patient seem confused? If so, we need to let Dr Tamala Julian know. He seems to not understand the office visit, and Dr Darliss Ridgel note is VERY clear

## 2014-03-23 NOTE — Telephone Encounter (Signed)
Pt is confused states that he thought he was being referred for his osteoarthritis all over that he already had someone for hand and back issues -cancelled the referral with [facility]

## 2014-03-23 NOTE — Telephone Encounter (Signed)
I am concerned about his confusion over who he is to see.

## 2014-03-24 NOTE — Telephone Encounter (Signed)
No, he states he told us he already has an orthopedist, and does not understand why we referred him to another, but in the office visit, he indicates he has no orthopedist. Butch Penny has discussed with patient, and he now understands, but he seems confused in general, I want you to be aware of his confused state (in general)

## 2014-03-24 NOTE — Telephone Encounter (Signed)
Perhaps I misunderstood patient during recent visit.  Is he interested in seeing rheumatology (since he already has an orthopedist and NS)?

## 2014-03-26 ENCOUNTER — Other Ambulatory Visit: Payer: Self-pay | Admitting: Physician Assistant

## 2014-03-31 ENCOUNTER — Ambulatory Visit: Payer: 59 | Admitting: Physician Assistant

## 2014-04-18 ENCOUNTER — Other Ambulatory Visit: Payer: Self-pay | Admitting: Physician Assistant

## 2014-04-20 NOTE — Telephone Encounter (Signed)
Faxed

## 2014-05-08 ENCOUNTER — Other Ambulatory Visit: Payer: Self-pay | Admitting: Neurosurgery

## 2014-05-08 ENCOUNTER — Other Ambulatory Visit: Payer: Self-pay | Admitting: Pediatrics

## 2014-05-08 DIAGNOSIS — M5416 Radiculopathy, lumbar region: Secondary | ICD-10-CM

## 2014-05-10 ENCOUNTER — Ambulatory Visit
Admission: RE | Admit: 2014-05-10 | Discharge: 2014-05-10 | Disposition: A | Discharge status: Home / Self Care | Payer: 59 | Source: Ambulatory Visit | Attending: Neurosurgery | Admitting: Neurosurgery

## 2014-05-10 DIAGNOSIS — M5416 Radiculopathy, lumbar region: Secondary | ICD-10-CM

## 2014-05-10 MED ORDER — GADOBENATE DIMEGLUMINE 529 MG/ML IV SOLN
20.0000 mL | Freq: Once | INTRAVENOUS | Status: AC | PRN
  Administered 2014-05-10: 20 mL via INTRAVENOUS

## 2014-05-31 ENCOUNTER — Other Ambulatory Visit: Payer: Self-pay | Admitting: Family Medicine

## 2014-06-02 ENCOUNTER — Other Ambulatory Visit: Payer: Self-pay | Admitting: Family Medicine

## 2014-06-02 NOTE — Telephone Encounter (Signed)
Faxed

## 2014-07-22 ENCOUNTER — Encounter: Payer: Self-pay | Admitting: Gastroenterology

## 2014-07-24 ENCOUNTER — Other Ambulatory Visit: Payer: Self-pay | Admitting: Physician Assistant

## 2014-07-27 NOTE — Telephone Encounter (Signed)
Faxed

## 2014-08-28 ENCOUNTER — Other Ambulatory Visit: Payer: Self-pay | Admitting: Physician Assistant

## 2014-08-29 ENCOUNTER — Other Ambulatory Visit: Payer: Self-pay | Admitting: Physician Assistant

## 2014-08-29 NOTE — Telephone Encounter (Signed)
Faxed

## 2014-09-15 ENCOUNTER — Ambulatory Visit (INDEPENDENT_AMBULATORY_CARE_PROVIDER_SITE_OTHER): Payer: 59

## 2014-09-15 ENCOUNTER — Ambulatory Visit (INDEPENDENT_AMBULATORY_CARE_PROVIDER_SITE_OTHER): Payer: 59 | Admitting: Physician Assistant

## 2014-09-15 ENCOUNTER — Ambulatory Visit: Payer: 59

## 2014-09-15 ENCOUNTER — Encounter: Payer: Self-pay | Admitting: Physician Assistant

## 2014-09-15 VITALS — BP 116/76 | HR 89 | Temp 98.1°F | Resp 16 | Ht 71.0 in | Wt 230.6 lb

## 2014-09-15 DIAGNOSIS — E782 Mixed hyperlipidemia: Secondary | ICD-10-CM

## 2014-09-15 DIAGNOSIS — R739 Hyperglycemia, unspecified: Secondary | ICD-10-CM

## 2014-09-15 DIAGNOSIS — M25551 Pain in right hip: Secondary | ICD-10-CM

## 2014-09-15 DIAGNOSIS — H9319 Tinnitus, unspecified ear: Secondary | ICD-10-CM | POA: Diagnosis not present

## 2014-09-15 DIAGNOSIS — Z125 Encounter for screening for malignant neoplasm of prostate: Secondary | ICD-10-CM

## 2014-09-15 DIAGNOSIS — Z0000 Encounter for general adult medical examination without abnormal findings: Secondary | ICD-10-CM | POA: Diagnosis not present

## 2014-09-15 DIAGNOSIS — Z23 Encounter for immunization: Secondary | ICD-10-CM

## 2014-09-15 DIAGNOSIS — R159 Full incontinence of feces: Secondary | ICD-10-CM | POA: Diagnosis not present

## 2014-09-15 DIAGNOSIS — M5136 Other intervertebral disc degeneration, lumbar region: Secondary | ICD-10-CM | POA: Insufficient documentation

## 2014-09-15 DIAGNOSIS — E669 Obesity, unspecified: Secondary | ICD-10-CM

## 2014-09-15 DIAGNOSIS — Z6832 Body mass index (BMI) 32.0-32.9, adult: Secondary | ICD-10-CM

## 2014-09-15 DIAGNOSIS — Z1211 Encounter for screening for malignant neoplasm of colon: Secondary | ICD-10-CM | POA: Diagnosis not present

## 2014-09-15 DIAGNOSIS — R002 Palpitations: Secondary | ICD-10-CM

## 2014-09-15 DIAGNOSIS — M51369 Other intervertebral disc degeneration, lumbar region without mention of lumbar back pain or lower extremity pain: Secondary | ICD-10-CM | POA: Insufficient documentation

## 2014-09-15 LAB — IFOBT (OCCULT BLOOD): IFOBT: NEGATIVE

## 2014-09-15 LAB — POCT GLYCOSYLATED HEMOGLOBIN (HGB A1C): Hemoglobin A1C: 6.1

## 2014-09-15 LAB — GLUCOSE, POCT (MANUAL RESULT ENTRY): POC Glucose: 123 mg/dl — AB (ref 70–99)

## 2014-09-15 MED ORDER — IBUPROFEN 800 MG PO TABS: 800.0000 mg | ORAL_TABLET | Freq: Three times a day (TID) | ORAL | Status: DC | PRN

## 2014-09-15 MED ORDER — EZETIMIBE-SIMVASTATIN 10-40 MG PO TABS: 1.0000 | ORAL_TABLET | Freq: Every day | ORAL | Status: DC

## 2014-09-15 MED ORDER — DIAZEPAM 10 MG PO TABS: 10.0000 mg | ORAL_TABLET | Freq: Three times a day (TID) | ORAL | Status: DC | PRN

## 2014-09-15 MED ORDER — ZOSTER VACCINE LIVE 19400 UNT/0.65ML ~~LOC~~ SOLR: 0.6500 mL | Freq: Once | SUBCUTANEOUS | Status: DC

## 2014-09-15 NOTE — Patient Instructions (Addendum)
Kegel Exercises These instructions are written for women.  I am sorry that there isn't a version specifically for men. However, the exercises can help to strengthen the muscles in your rectum and reduce the leakage of stool.  Make sure that you tell Dr. Ellene Route about this symptom at each visit with him.  At some point, you may need additional treatment. The goal of Kegel exercises is to isolate and exercise your pelvic floor muscles. These muscles act as a hammock that supports the rectum, vagina, small intestine, and uterus. As the muscles weaken, the hammock sags and these organs are displaced from their normal positions. Kegel exercises can strengthen your pelvic floor muscles and help you to improve bladder and bowel control, improve sexual response, and help reduce many problems and some discomfort during pregnancy. Kegel exercises can be done anywhere and at any time. HOW TO PERFORM KEGEL EXERCISES 1. Locate your pelvic floor muscles. To do this, squeeze (contract) the muscles that you use when you try to stop the flow of urine. You will feel a tightness in the vaginal area (women) and a tight lift in the rectal area (men and women). 2. When you begin, contract your pelvic muscles tight for 2-5 seconds, then relax them for 2-5 seconds. This is one set. Do 4-5 sets with a short pause in between. 3. Contract your pelvic muscles for 8-10 seconds, then relax them for 8-10 seconds. Do 4-5 sets. If you cannot contract your pelvic muscles for 8-10 seconds, try 5-7 seconds and work your way up to 8-10 seconds. Your goal is 4-5 sets of 10 contractions each day. Keep your stomach, buttocks, and legs relaxed during the exercises. Perform sets of both short and long contractions. Vary your positions. Perform these contractions 3-4 times per day. Perform sets while you are:  1. Lying in bed in the morning. 2. Standing at lunch. 3. Sitting in the late afternoon. 4. Lying in bed at night. You should do 40-50  contractions per day. Do not perform more Kegel exercises per day than recommended. Overexercising can cause muscle fatigue. Continue these exercises for for at least 15-20 weeks or as directed by your caregiver. Document Released: 05/29/2012 Document Reviewed: 05/29/2012 [facility] Patient Information 2015 [place]. This information is not intended to replace advice given to you by your health care provider. Make sure you discuss any questions you have with your health care provider.   Keeping you healthy  Get these tests  Blood pressure- Have your blood pressure checked once a year by your healthcare provider.  Normal blood pressure is 120/80  Weight- Have your body mass index (BMI) calculated to screen for obesity.  BMI is a measure of body fat based on height and weight. You can also calculate your own BMI at ViewBanking.si.  Cholesterol- Have your cholesterol checked every year.  Diabetes- Have your blood sugar checked regularly if you have high blood pressure, high cholesterol, have a family history of diabetes or if you are overweight.  Screening for Colon Cancer- Colonoscopy starting at age 17.  Screening may begin sooner depending on your family history and other health conditions. Follow up colonoscopy as directed by your Gastroenterologist.  Screening for Prostate Cancer- Both blood work (PSA) and a rectal exam help screen for Prostate Cancer.  Screening begins at age 14 with African-American men and at age 6 with Caucasian men.  Screening may begin sooner depending on your family history.  Take these medicines  Aspirin- One aspirin daily can  help prevent Heart disease and Stroke.  Flu shot- Every fall.  Tetanus- Every 10 years.  Zostavax- Once after the age of 43 to prevent Shingles.  Pneumonia shot- Once after the age of 49; if you are younger than 25, ask your healthcare provider if you need a Pneumonia shot.  Take these steps  Don't smoke- If you do  smoke, talk to your doctor about quitting.  For tips on how to quit, go to www.smokefree.gov or call 1-800-QUIT-NOW.  Be physically active- Exercise 5 days a week for at least 30 minutes.  If you are not already physically active start slow and gradually work up to 30 minutes of moderate physical activity.  Examples of moderate activity include walking briskly, mowing the yard, dancing, swimming, bicycling, etc.  Eat a healthy diet- Eat a variety of healthy food such as fruits, vegetables, low fat milk, low fat cheese, yogurt, lean meant, poultry, fish, beans, tofu, etc. For more information go to www.thenutritionsource.org  Drink alcohol in moderation- Limit alcohol intake to less than two drinks a day. Never drink and drive.  Dentist- Brush and floss twice daily; visit your dentist twice a year.  Depression- Your emotional health is as important as your physical health. If you're feeling down, or losing interest in things you would normally enjoy please talk to your healthcare provider.  Eye exam- Visit your eye doctor every year.  Safe sex- If you may be exposed to a sexually transmitted infection, use a condom.  Seat belts- Seat belts can save your life; always wear one.  Smoke/Carbon Monoxide detectors- These detectors need to be installed on the appropriate level of your home.  Replace batteries at least once a year.  Skin cancer- When out in the sun, cover up and use sunscreen 15 SPF or higher.  Violence- If anyone is threatening you, please tell your healthcare provider.  Living Will/ Health care power of attorney- Speak with your healthcare provider and family.

## 2014-09-15 NOTE — Progress Notes (Signed)
Patient ID: Craig Roberson, male    DOB: Jan 04, 1956, 59 y.o.   MRN: [id number]  PCP: Annora Guderian, PA-C  Subjective:   Chief Complaint  Patient presents with  . Annual Exam    HPI Presents for annual exam. He says that he's doing well in general, though he continues to have low back pain with RIGHT leg radiculopathy and swelling.  In addition, he notes that he has RIGHT hip pain and sometimes has leakage of a small amount of stool. He reports that Dr. Tollie Pizza told him that the hip pain could be coming from his back problem, but that the stool leakage is not.  He had a recent MRI.  MRI LSpine 04/2014: IMPRESSION: 1. Prior L3-4 PLIF without stenosis. 2. Adjacent segment disease at L4-5 resulting in moderate spinal stenosis and severe bilateral neural foraminal stenosis. 3. Mild left lateral recess stenosis at L1-2 due to a disc Protrusion.  Colonoscopy is current. Tdap and flu vaccines are current. He has not received the shingles vaccine-he's lost the prescription I gave him previously.  Review of Systems Review of Systems  Constitutional: Negative.   HENT: Positive for hearing loss and tinnitus. Negative for congestion, dental problem, drooling, ear discharge, ear pain, facial swelling, mouth sores, nosebleeds, postnasal drip, rhinorrhea, sinus pressure, sneezing, sore throat, trouble swallowing and voice change.   Eyes: Negative.   Respiratory: Negative.   Cardiovascular: Positive for palpitations (only when he drinks caffeine) and leg swelling (RIGHT leg). Negative for chest pain.  Gastrointestinal: Negative.   Endocrine: Negative.   Genitourinary: Negative.   Musculoskeletal: Positive for myalgias, back pain, joint swelling and neck stiffness.  Skin: Negative.   Neurological: Negative.   Hematological: Negative.   Psychiatric/Behavioral: Negative.        Patient Active Problem List   Diagnosis Date Noted  . BMI 32.0-32.9,adult 09/15/2014  . Degenerative  disc disease, lumbar 09/15/2014  . Obesity (BMI 30-39.9) 09/02/2013  . DJD (degenerative joint disease), multiple sites 02/18/2013  . Diverticulosis of sigmoid colon 08/20/2012  . AR (allergic rhinitis) 03/19/2012  . Mixed hyperlipidemia 03/19/2012  . Cardiac murmur   . ED (erectile dysfunction)   . Tinnitus of both ears   . Macular degeneration   . Hernia     Prior to Admission medications   Medication Sig Start Date End Date Taking? Authorizing Provider  azelastine (ASTELIN) 0.1 % nasal spray Place 1 spray into both nostrils 2 (two) times daily. Use in each nostril as directed 03/18/14 03/18/15 Yes [name], MD  cholecalciferol (VITAMIN D) 1000 UNITS tablet Take 1,000 Units by mouth daily.   Yes Historical Provider, MD  cyclobenzaprine (FLEXERIL) 10 MG tablet Take 1 tablet (10 mg total) by mouth 3 (three) times daily as needed for muscle spasms. 09/23/12  Yes Roselee Culver, MD  diazepam (VALIUM) 10 MG tablet Take 1 tablet (10 mg total) by mouth every 8 (eight) hours as needed. 09/15/14  Yes Noriko Macari S Caidyn Henricksen, PA-C  ezetimibe-simvastatin (VYTORIN) 10-40 MG per tablet Take 1 tablet by mouth at bedtime. 09/15/14  Yes Latiana Tomei S [place] Welcome, PA-C  fish oil-omega-3 fatty acids 1000 MG capsule Take 2 g by mouth daily.   Yes Historical Provider, MD  vardenafil (LEVITRA) 10 MG tablet Take 1 tablet (10 mg total) by mouth daily as needed for erectile dysfunction. 03/19/12  Yes Kaede Clendenen S Bayden Gil, PA-C  zoster vaccine live, PF, (ZOSTAVAX) 57846 UNT/0.65ML injection Inject 19,400 Units into the skin once. 09/15/14   Fara Chute,  PA-C    Allergies  Allergen Reactions  . Codeine Other (See Comments)    Dizziness; faint  . Methocarbamol Other (See Comments)    HA, tinnitus  . Morphine And Related Other (See Comments)    Severe Headache  . Nsaids     BRBPR    Past Medical History  Diagnosis Date  . Arthritis     bilateral thumbs  . Allergy   . Cardiac murmur 1996    normal ECHO  .  Hyperlipidemia   . ED (erectile dysfunction) 2006  . H/O ETOH abuse     Quit 2006  . Tinnitus of both ears   . BCC (basal cell carcinoma of skin) 04/2006    LEFT low back  . Ulcer   . Macular degeneration   . Diverticulosis   . Hernia     RIGHT inguinal     Past Surgical History  Procedure Laterality Date  . Ganglion cyst excision  1970's  . Thumb fusion      BILATERAL  . Lumbar fusion      L3-4; Dr. Ellene Route  . Vasectomy  1976  . Spine surgery      Family History  Problem Relation Age of Onset  . Heart disease Brother 3    AMI, normal weight, normal cholesterol  . Heart disease Father 52  . Cancer Father     Prostate  . Stroke Father   . Stroke Son 49  . Hyperlipidemia Mother   . Atrial fibrillation Brother   . Vascular Disease Brother     carotid artery disease    History   Social History  . Marital Status: Married    Spouse Name: [name]  . Number of Children: 2  . Years of Education: 8   Occupational History  . Retired     Careers information officer   Social History Main Topics  . Smoking status: Former Research scientist (life sciences)  . Smokeless tobacco: Current User    Types: Chew     Comment: some days  . Alcohol Use: No  . Drug Use: No  . Sexual Activity:    Partners: Female     Comment: family stress; his back pain have reduced the frequency   Other Topics Concern  . None   Social History Narrative   Married to 3rd wife.         Objective:  Physical Exam  Physical Exam  Constitutional: He is oriented to person, place, and time. Vital signs are normal. He appears well-developed and well-nourished. He is active and cooperative.  Non-toxic appearance. He does not have a sickly appearance. He does not appear ill. No distress.  BP 116/76 mmHg  Pulse 89  Temp(Src) 98.1 F (36.7 C) (Oral)  Resp 16  Ht 5\' 11"  (1.803 m)  Wt 230 lb 9.6 oz (104.599 kg)  BMI 32.18 kg/m2  SpO2 96%   HENT:  Head: Normocephalic and atraumatic.  Right Ear: Hearing, tympanic  membrane, external ear and ear canal normal.  Left Ear: Hearing, tympanic membrane, external ear and ear canal normal.  Nose: Nose normal.  Mouth/Throat: Uvula is midline, oropharynx is clear and moist and mucous membranes are normal. He does not have dentures. No oral lesions. No trismus in the jaw. Normal dentition. No dental abscesses, uvula swelling, lacerations or dental caries.  Eyes: Conjunctivae, EOM and lids are normal. Pupils are equal, round, and reactive to light. Right eye exhibits no discharge. Left eye exhibits no discharge. No scleral icterus.  Fundoscopic  exam:      The right eye shows no arteriolar narrowing, no AV nicking, no exudate, no hemorrhage and no papilledema.       The left eye shows no arteriolar narrowing, no AV nicking, no exudate, no hemorrhage and no papilledema.  Neck: Normal range of motion, full passive range of motion without pain and phonation normal. Neck supple. No spinous process tenderness and no muscular tenderness present. No rigidity. No tracheal deviation, no edema, no erythema and normal range of motion present. No thyromegaly present.  Cardiovascular: Normal rate, regular rhythm, S1 normal, S2 normal, normal heart sounds, intact distal pulses and normal pulses.  Exam reveals no gallop and no friction rub.   No murmur heard. Pulmonary/Chest: Effort normal and breath sounds normal. No respiratory distress. He has no wheezes. He has no rales.  Abdominal: Soft. Normal appearance and bowel sounds are normal. He exhibits no distension and no mass. There is no hepatosplenomegaly. There is no tenderness. There is no rebound and no guarding. A hernia is present. Hernia confirmed positive in the right inguinal area. Hernia confirmed negative in the ventral area and confirmed negative in the left inguinal area.    Genitourinary: Prostate normal, testes normal and penis normal. Rectal exam shows anal tone abnormal (reduced). Guaiac negative stool. Circumcised. No  phimosis, paraphimosis, hypospadias, penile erythema or penile tenderness. No discharge found.  Musculoskeletal: He exhibits no edema.       Right hip: He exhibits decreased range of motion, tenderness and bony tenderness. He exhibits normal strength, no swelling, no crepitus, no deformity and no laceration.       Left hip: Normal.       Right knee: Normal.       Left knee: Normal.       Right ankle: Achilles tendon normal.       Left ankle: Achilles tendon normal.       Cervical back: He exhibits normal range of motion, no tenderness, no bony tenderness, no swelling, no edema, no deformity, no laceration, no pain, no spasm and normal pulse.       Right upper leg: Normal.       Left upper leg: Normal.       Legs: Lymphadenopathy:       Head (right side): No submental, no submandibular, no tonsillar, no preauricular, no posterior auricular and no occipital adenopathy present.       Head (left side): No submental, no submandibular, no tonsillar, no preauricular, no posterior auricular and no occipital adenopathy present.    He has no cervical adenopathy.       Right: No inguinal and no supraclavicular adenopathy present.       Left: No inguinal and no supraclavicular adenopathy present.  Neurological: He is alert and oriented to person, place, and time. He has normal strength and normal reflexes. He displays no tremor. No cranial nerve deficit or sensory deficit. He exhibits normal muscle tone. Coordination and gait normal.  Skin: Skin is warm, dry and intact. No abrasion, no ecchymosis, no laceration, no lesion and no rash noted. He is not diaphoretic. No cyanosis or erythema. No pallor. Nails show no clubbing.  Psychiatric: He has a normal mood and affect. His speech is normal and behavior is normal. Judgment and thought content normal. Cognition and memory are normal.       Results for orders placed or performed in visit on 09/15/14  POCT glucose (manual entry)  Result Value Ref Range    POC  Glucose 123 (A) 70 - 99 mg/dl  POCT glycosylated hemoglobin (Hb A1C)  Result Value Ref Range   Hemoglobin A1C 6.1   IFOBT POC (occult bld, rslt in office)  Result Value Ref Range   IFOBT Negative    RIGHT HIP: [facility] reading (PRIMARY) by  Dr. Everlene Farrier. Normal appearing hip. Hardware consistent with previous lumbar surgeries.      Assessment & Plan:  1. Annual physical exam Age appropriate anticipatory guidance provided.  2. Mixed hyperlipidemia Await lab results. Adjust Vytorin dose if indicated. - ezetimibe-simvastatin (VYTORIN) 10-40 MG per tablet; Take 1 tablet by mouth at bedtime.  Dispense: 30 tablet; Refill: 5  3. BMI 32.0-32.9,adult 4. Obesity Counseled on healthy eating and regular exercise. Reduce sugary drinks.  5. Screening for prostate cancer - PSA  6. Ringing in ears, unspecified laterality Stable. - diazepam (VALIUM) 10 MG tablet; Take 1 tablet (10 mg total) by mouth every 8 (eight) hours as needed.  Dispense: 30 tablet; Refill: 0  7. Hyperglycemia Stable. Healthy lifetyle changes, as above. No need to start metformin at this time. - POCT glucose (manual entry) - POCT glycosylated hemoglobin (Hb A1C)  8. Encopresis Likely due to degenerative back disease. He needs to discuss this with Dr. Ellene Route at each visit. If it worsens, additional intervention may be warranted.  9. Degenerative disc disease, lumbar See above. - ibuprofen (ADVIL,MOTRIN) 800 MG tablet; Take 1 tablet (800 mg total) by mouth every 8 (eight) hours as needed.  Dispense: 90 tablet; Refill: 1  10. Heart palpitations Normal EKG today. Avoid caffeine. Re-evaluate if symptoms occur without caffeine. - EKG 12-Lead  11. Hip pain, right Likely due to back disease. See above. - DG HIP UNILAT WITH PELVIS 2-3 VIEWS RIGHT; Future  12. Need for shingles vaccine Encouraged him to receive this vaccine at his local drug store. - zoster vaccine live, PF, (ZOSTAVAX) 06301 UNT/0.65ML injection; Inject  19,400 Units into the skin once.  Dispense: 0.65 mL; Refill: 0  13. Screening for colon cancer Colonoscopy in 2011. Annual iFOBT negative today. - IFOBT POC (occult bld, rslt in office)   Fara Chute, PA-C Physician Assistant-Certified [facility]

## 2014-09-16 ENCOUNTER — Encounter: Payer: Self-pay | Admitting: Physician Assistant

## 2014-09-16 LAB — PSA: PSA: 0.53 ng/mL (ref <=4.00)

## 2014-09-24 ENCOUNTER — Other Ambulatory Visit: Payer: Self-pay | Admitting: Physician Assistant

## 2014-09-25 NOTE — Telephone Encounter (Signed)
I refilled this on 3/22 when he was here for his physical. Has he used it up already?

## 2014-09-28 NOTE — Telephone Encounter (Signed)
Called pt and he reported that he does not need this RFd again. He actually just took in the hard copy that Stanly wrote for him 09/15/14 yesterday and had it filled.

## 2014-10-20 ENCOUNTER — Other Ambulatory Visit: Payer: Self-pay | Admitting: Physician Assistant

## 2014-10-22 NOTE — Telephone Encounter (Signed)
Patient is completely out of "Diazapam" and is requesting it to be called in to [facility] on [address]. Per patient his pharmacy states they have sent [place] several requests. Patients call back number is [phone]

## 2014-10-23 ENCOUNTER — Telehealth: Payer: Self-pay | Admitting: Radiology

## 2014-10-23 NOTE — Telephone Encounter (Signed)
Called in rx and notified pt. 

## 2014-11-07 ENCOUNTER — Other Ambulatory Visit: Payer: Self-pay | Admitting: Physician Assistant

## 2014-11-09 ENCOUNTER — Other Ambulatory Visit: Payer: Self-pay | Admitting: Physician Assistant

## 2014-11-09 NOTE — Telephone Encounter (Signed)
Rx faxed. Diazepam.

## 2014-11-18 ENCOUNTER — Other Ambulatory Visit: Payer: Self-pay | Admitting: Physician Assistant

## 2014-11-30 ENCOUNTER — Other Ambulatory Visit: Payer: Self-pay | Admitting: Physician Assistant

## 2014-12-02 NOTE — Telephone Encounter (Signed)
Patient is calling to follow up on medication refill. Patient's phone: [phone]

## 2014-12-03 ENCOUNTER — Other Ambulatory Visit: Payer: Self-pay | Admitting: Physician Assistant

## 2014-12-03 NOTE — Telephone Encounter (Signed)
Faxed

## 2015-01-25 ENCOUNTER — Other Ambulatory Visit: Payer: Self-pay | Admitting: Physician Assistant

## 2015-01-26 ENCOUNTER — Other Ambulatory Visit: Payer: Self-pay | Admitting: Physician Assistant

## 2015-01-26 NOTE — Telephone Encounter (Signed)
rx printed at 104. Will bring to 102 after clinic.  Meds ordered this encounter  Medications  . diazepam (VALIUM) 10 MG tablet    Sig: TAKE 1 TABLET BY MOUTH EVERY 8 HOURS AS NEEDED (WILL PAY 6/17)    Dispense:  30 tablet    Refill:  0    Not to exceed 5 additional fills before 06/01/2015.

## 2015-02-17 ENCOUNTER — Other Ambulatory Visit: Payer: Self-pay | Admitting: Physician Assistant

## 2015-02-18 ENCOUNTER — Other Ambulatory Visit: Payer: Self-pay | Admitting: Physician Assistant

## 2015-02-19 NOTE — Telephone Encounter (Signed)
Faxed

## 2015-03-09 ENCOUNTER — Other Ambulatory Visit: Payer: Self-pay | Admitting: Physician Assistant

## 2015-03-09 NOTE — Telephone Encounter (Signed)
Meds ordered this encounter  Medications  . diazepam (VALIUM) 10 MG tablet    Sig: TAKE 1 TABLET BY MOUTH EVERY 8 HOURS AS NEEDED    Dispense:  30 tablet    Refill:  0    Not to exceed 5 additional fills before 07/25/2015.

## 2015-03-11 NOTE — Telephone Encounter (Signed)
This was faxed

## 2015-03-18 ENCOUNTER — Ambulatory Visit (INDEPENDENT_AMBULATORY_CARE_PROVIDER_SITE_OTHER): Payer: 59 | Admitting: Physician Assistant

## 2015-03-18 ENCOUNTER — Encounter: Payer: Self-pay | Admitting: Physician Assistant

## 2015-03-18 ENCOUNTER — Telehealth: Payer: Self-pay | Admitting: Physician Assistant

## 2015-03-18 VITALS — BP 126/74 | HR 74 | Temp 97.9°F | Resp 16 | Ht 71.25 in | Wt 254.6 lb

## 2015-03-18 DIAGNOSIS — Z8249 Family history of ischemic heart disease and other diseases of the circulatory system: Secondary | ICD-10-CM

## 2015-03-18 DIAGNOSIS — R002 Palpitations: Secondary | ICD-10-CM

## 2015-03-18 DIAGNOSIS — E119 Type 2 diabetes mellitus without complications: Secondary | ICD-10-CM | POA: Insufficient documentation

## 2015-03-18 DIAGNOSIS — M5136 Other intervertebral disc degeneration, lumbar region: Secondary | ICD-10-CM | POA: Diagnosis not present

## 2015-03-18 DIAGNOSIS — Z23 Encounter for immunization: Secondary | ICD-10-CM | POA: Diagnosis not present

## 2015-03-18 DIAGNOSIS — Z114 Encounter for screening for human immunodeficiency virus [HIV]: Secondary | ICD-10-CM

## 2015-03-18 DIAGNOSIS — E782 Mixed hyperlipidemia: Secondary | ICD-10-CM

## 2015-03-18 DIAGNOSIS — R159 Full incontinence of feces: Secondary | ICD-10-CM

## 2015-03-18 DIAGNOSIS — R739 Hyperglycemia, unspecified: Secondary | ICD-10-CM

## 2015-03-18 LAB — COMPREHENSIVE METABOLIC PANEL
ALBUMIN: 4.4 g/dL (ref 3.6–5.1)
ALT: 20 U/L (ref 9–46)
AST: 18 U/L (ref 10–35)
Alkaline Phosphatase: 80 U/L (ref 40–115)
BUN: 13 mg/dL (ref 7–25)
CALCIUM: 9.1 mg/dL (ref 8.6–10.3)
CHLORIDE: 104 mmol/L (ref 98–110)
CO2: 23 mmol/L (ref 20–31)
Creat: 0.89 mg/dL (ref 0.70–1.25)
GLUCOSE: 109 mg/dL — AB (ref 65–99)
Potassium: 4.3 mmol/L (ref 3.5–5.3)
Sodium: 138 mmol/L (ref 135–146)
Total Bilirubin: 0.6 mg/dL (ref 0.2–1.2)
Total Protein: 7.2 g/dL (ref 6.1–8.1)

## 2015-03-18 LAB — GLUCOSE, POCT (MANUAL RESULT ENTRY): POC Glucose: 113 mg/dl — AB (ref 70–99)

## 2015-03-18 LAB — POCT GLYCOSYLATED HEMOGLOBIN (HGB A1C): Hemoglobin A1C: 6.4

## 2015-03-18 LAB — LIPID PANEL
CHOL/HDL RATIO: 4.8 ratio (ref <=5.0)
CHOLESTEROL: 178 mg/dL (ref 125–200)
HDL: 37 mg/dL — AB (ref >=40)
LDL Cholesterol: 107 mg/dL (ref <130)
Triglycerides: 172 mg/dL — ABNORMAL HIGH (ref <150)
VLDL: 34 mg/dL — AB (ref <30)

## 2015-03-18 LAB — HIV ANTIBODY (ROUTINE TESTING W REFLEX): HIV 1&2 Ab, 4th Generation: NONREACTIVE

## 2015-03-18 MED ORDER — ZOSTER VACCINE LIVE 19400 UNT/0.65ML ~~LOC~~ SOLR: 0.6500 mL | Freq: Once | SUBCUTANEOUS | Status: DC

## 2015-03-18 NOTE — Progress Notes (Signed)
Patient ID: Craig Roberson, male    DOB: June 13, 1956, 59 y.o.   MRN: [id number]  PCP: JEFFERY,CHELLE, PA-C  Subjective:   Chief Complaint  Patient presents with  . Follow-up    6 mos  . Hyperlipidemia  . Hyperglycemia  . Encopresis    HPI Presents for follow-up evaluation of hyperlipidemia, hyperglycemia, and encopresis.   He has concerns about his joint pain. He has known diffuse degenerative joint disease and takes is taking Ibuprofen 800 mg daily for the joint pain. He says that it helps for a little bit, but he is still feeling very sore, "like I've been in a car wreck" after doing yard work or other strenuous activities. Not currently taking Flexeril, prescribed by Dr. Ellene Route. He has constant lower back pain from degenerative disc disease and extensive surgeries.   He continues to have stool leakage, mainly when doing physical activity. He also continues to have radiculopathy of the right leg. He saw Dr. Ellene Route, his neurosurgeon, in April who did not have concerns that the encopresis problem is related to his degenerative disc disease of the lumbar region.   Stopped smoking and dipping in April 2016. He continues to drink a lot of Pepsi and not eat well. He says that, "I know what I need to do, I need to just do it."   He has an extensive family history of cardiovascular disease. His father had an MI at 53. His brother had a "heart clot that didn't affect his heart" and had to have 2 stents put in. His other brother had carotid stenosis, one artery 100% occluded and the other 85%. One brother had to have a pacemaker put in.   He also continues to have tinnitus.    Review of Systems Constitutional: Negative for fever and chills.  Respiratory: Negative for shortness of breath.  Cardiovascular: Positive for palpitations (When drinking caffeine). Negative for chest pain.  Gastrointestinal: Negative for nausea, vomiting, diarrhea and constipation.  Endocrine: Negative for  polydipsia, polyphagia and polyuria.  Genitourinary: Negative for dysuria, urgency and frequency.  Musculoskeletal: Positive for arthralgias. Negative for myalgias.  Skin: Negative for rash.  Neurological: Negative for dizziness, syncope, light-headedness and headaches.      Patient Active Problem List   Diagnosis Date Noted  . BMI 32.0-32.9,adult 09/15/2014  . Degenerative disc disease, lumbar 09/15/2014  . Obesity (BMI 30-39.9) 09/02/2013  . DJD (degenerative joint disease), multiple sites 02/18/2013  . Diverticulosis of sigmoid colon 08/20/2012  . AR (allergic rhinitis) 03/19/2012  . Mixed hyperlipidemia 03/19/2012  . Cardiac murmur   . ED (erectile dysfunction)   . Tinnitus of both ears   . Macular degeneration   . Hernia      Prior to Admission medications   Medication Sig Start Date End Date Taking? Authorizing Provider  azelastine (ASTELIN) 0.1 % nasal spray Place 1 spray into both nostrils 2 (two) times daily. Use in each nostril as directed 03/18/14 03/18/15 Yes [name], MD  cholecalciferol (VITAMIN D) 1000 UNITS tablet Take 1,000 Units by mouth daily.   Yes Historical Provider, MD  cyclobenzaprine (FLEXERIL) 10 MG tablet Take 1 tablet (10 mg total) by mouth 3 (three) times daily as needed for muscle spasms. 09/23/12  Yes Roselee Culver, MD  diazepam (VALIUM) 10 MG tablet TAKE 1 TABLET BY MOUTH EVERY 8 HOURS AS NEEDED 03/09/15  Yes [name], PA-C  ezetimibe-simvastatin (VYTORIN) 10-40 MG per tablet Take 1 tablet by mouth at bedtime. 09/15/14  Yes  [name], PA-C  fish oil-omega-3 fatty acids 1000 MG capsule Take 2 g by mouth daily.   Yes Historical Provider, MD  ibuprofen (ADVIL,MOTRIN) 800 MG tablet TAKE 1 TABLET BY MOUTH EVERY 8 HOURS AS NEEDED 02/18/15  Yes [name], PA-C  vardenafil (LEVITRA) 10 MG tablet Take 1 tablet (10 mg total) by mouth daily as needed for erectile dysfunction. 03/19/12  Yes [name], PA-C  zoster vaccine live, PF,  (ZOSTAVAX) 98921 UNT/0.65ML injection Inject 19,400 Units into the skin once. Patient not taking: Reported on 03/18/2015 09/15/14   [name], PA-C  zoster vaccine live, PF, (ZOSTAVAX) 19417 UNT/0.65ML injection Inject 19,400 Units into the skin once. 03/18/15   [name], PA-C     Allergies  Allergen Reactions  . Codeine Other (See Comments)    Dizziness; faint  . Methocarbamol Other (See Comments)    HA, tinnitus  . Morphine And Related Other (See Comments)    Severe Headache  . Nsaids     BRBPR       Objective:  Physical Exam  Constitutional: He is oriented to person, place, and time. Vital signs are normal. He appears well-developed and well-nourished. He is active and cooperative. No distress.  BP 126/74 mmHg  Pulse 74  Temp(Src) 97.9 F (36.6 C) (Oral)  Resp 16  Ht 5' 11.25" (1.81 m)  Wt 254 lb 9.6 oz (115.486 kg)  BMI 35.25 kg/m2  SpO2 97%  HENT:  Head: Normocephalic and atraumatic.  Right Ear: Hearing normal.  Left Ear: Hearing normal.  Eyes: Conjunctivae are normal. No scleral icterus.  Neck: Normal range of motion. Neck supple. No thyromegaly present.  Cardiovascular: Normal rate, regular rhythm and normal heart sounds.   Pulses:      Radial pulses are 2+ on the right side, and 2+ on the left side.  Pulmonary/Chest: Effort normal and breath sounds normal.  Lymphadenopathy:       Head (right side): No tonsillar, no preauricular, no posterior auricular and no occipital adenopathy present.       Head (left side): No tonsillar, no preauricular, no posterior auricular and no occipital adenopathy present.    He has no cervical adenopathy.       Right: No supraclavicular adenopathy present.       Left: No supraclavicular adenopathy present.  Neurological: He is alert and oriented to person, place, and time. No sensory deficit.  Skin: Skin is warm, dry and intact. No rash noted. No cyanosis or erythema. Nails show no clubbing.  Psychiatric: He has a normal  mood and affect. His speech is normal and behavior is normal.       Results for orders placed or performed in visit on 03/18/15  POCT glucose (manual entry)  Result Value Ref Range   POC Glucose 113 (A) 70 - 99 mg/dl  POCT glycosylated hemoglobin (Hb A1C)  Result Value Ref Range   Hemoglobin A1C 6.4        Assessment & Plan:   1. Mixed hyperlipidemia Await lab results. - Lipid panel - Comprehensive metabolic panel  2. Hyperglycemia Diabetes. Can start with a real effort for healthier lifestyle changes. - POCT glucose (manual entry) - POCT glycosylated hemoglobin (Hb A1C)  3. Diabetes mellitus type 2, uncomplicated New diagnosis. Education class. Lifestyle changes. Reassess in 3 months. - Ambulatory referral to diabetic education  4. Encopresis I spoke with Dr. Clarice Pole assistant who relayed my concerns. Dr. Ellene Route does not believe that this is due to his back, since  it's incomplete. As such, we will proceed with GI evaluation. He has previously seen Dr. Oletta Lamas.  5. Degenerative disc disease, lumbar Continue with Dr. Ellene Route per his instructions.  6. Heart palpitations 7. Family history of ASCVD (arteriosclerotic cardiovascular disease) Encouraged reduction in caffeine consumption. - Ambulatory referral to Cardiology  8. Flu vaccine need - Flu Vaccine QUAD 36+ mos IM  9. Need for shingles vaccine - zoster vaccine live, PF, (ZOSTAVAX) 88916 UNT/0.65ML injection; Inject 19,400 Units into the skin once.  Dispense: 1 each; Refill: 0  10. Screening for HIV (human immunodeficiency virus) - HIV antibody   Fara Chute, PA-C Physician Assistant-Certified [facility]

## 2015-03-18 NOTE — Telephone Encounter (Signed)
Chelle please see previous message. Please advise

## 2015-03-18 NOTE — Telephone Encounter (Signed)
Spoke with Butch Penny, Dr. Clarice Pole MA. Reviewed my concerns regarding encopresis. She indicates that the patient hasn't been there since February 2016 for an epidural injection. The last office visit was in December, and notes do not mention this problem.  She will talk with Dr. Ellene Route and call me back with his thoughts on additional evaluation.

## 2015-03-18 NOTE — Patient Instructions (Addendum)
I will contact you with your lab results as soon as they are available.   If you have not heard from me in 2 weeks, please contact me.  The fastest way to get your results is to register for My Chart (see the instructions on the last page of this printout).  I will contact Dr. Clarice Pole office regarding the loss of control/leakage of stool. That is NOT normal.  I will contact you with the next step in the plan to evaluate this problem after I speak with him.  Please get the shingles vaccine at your pharmacy.

## 2015-03-18 NOTE — Telephone Encounter (Signed)
Chelle,    Seen this morning.   Patient wants a referral to [facility]   [phone]

## 2015-03-18 NOTE — Progress Notes (Signed)
Subjective:     Patient ID: Craig Roberson, male   DOB: August 30, 1955, 59 y.o.   MRN: [id number] PCP: JEFFERY,CHELLE, PA-C   Chief Complaint  Patient presents with  . Follow-up    6 mos  . Hyperlipidemia  . Hyperglycemia  . Encopresis    HPI Patient presents today for 6 month follow-up of hyperlipidemia, hyperglycemia, and encopresis.   He has concerns about his joint pain. He is taking Ibuprofen 800 mg daily for the joint pain. He says that it helps for a little bit, but he is still feeling very sore, "like I've been in a car wreck." Not currently taking Flexeril. He has constant lower back pain from degenerative disc disease and extensive surgeries.  He continues to have stool leakage, mainly when doing physical activity. He also continues to have radiculopathy of the right leg. He saw Dr. Ellene Route, his neurosurgeon, in April who did not have concerns that the encopresis problem is related to his degenerative disc disease of the lumbar region.   Stopped smoking and dipping in April 2016. He continues to drink a lot of Pepsi and not eat well. He says that, "I know what I need to do, I need to just do it."   He has an extensive family history of cardiovascular disease. His father had an MI at 41. His brother had a "heart clot that didn't affect his heart" and had to have 2 stents put in. His other brother had carotid stenosis, one artery 100% occluded and the other 85%. One brother had to have a pacemaker put in.  He also continues to have tinnitus.   Review of Systems  Constitutional: Negative for fever and chills.  Respiratory: Negative for shortness of breath.   Cardiovascular: Positive for palpitations (When drinking caffeine). Negative for chest pain.  Gastrointestinal: Negative for nausea, vomiting, diarrhea and constipation.  Endocrine: Negative for polydipsia, polyphagia and polyuria.  Genitourinary: Negative for dysuria, urgency and frequency.  Musculoskeletal: Positive for  arthralgias. Negative for myalgias.  Skin: Negative for rash.  Neurological: Negative for dizziness, syncope, light-headedness and headaches.  See HPI   Patient Active Problem List   Diagnosis Date Noted  . BMI 32.0-32.9,adult 09/15/2014  . Degenerative disc disease, lumbar 09/15/2014  . Obesity (BMI 30-39.9) 09/02/2013  . DJD (degenerative joint disease), multiple sites 02/18/2013  . Diverticulosis of sigmoid colon 08/20/2012  . AR (allergic rhinitis) 03/19/2012  . Mixed hyperlipidemia 03/19/2012  . Cardiac murmur   . ED (erectile dysfunction)   . Tinnitus of both ears   . Macular degeneration   . Hernia      Prior to Admission medications   Medication Sig Start Date End Date Taking? Authorizing Provider  azelastine (ASTELIN) 0.1 % nasal spray Place 1 spray into both nostrils 2 (two) times daily. Use in each nostril as directed 03/18/14 03/18/15 Yes [name], MD  cholecalciferol (VITAMIN D) 1000 UNITS tablet Take 1,000 Units by mouth daily.   Yes Historical Provider, MD  cyclobenzaprine (FLEXERIL) 10 MG tablet Take 1 tablet (10 mg total) by mouth 3 (three) times daily as needed for muscle spasms. 09/23/12  Yes Roselee Culver, MD  diazepam (VALIUM) 10 MG tablet TAKE 1 TABLET BY MOUTH EVERY 8 HOURS AS NEEDED 03/09/15  Yes [name], PA-C  ezetimibe-simvastatin (VYTORIN) 10-40 MG per tablet Take 1 tablet by mouth at bedtime. 09/15/14  Yes [name], PA-C  fish oil-omega-3 fatty acids 1000 MG capsule Take 2 g by mouth daily.  Yes Historical Provider, MD  ibuprofen (ADVIL,MOTRIN) 800 MG tablet TAKE 1 TABLET BY MOUTH EVERY 8 HOURS AS NEEDED 02/18/15  Yes [name], PA-C  vardenafil (LEVITRA) 10 MG tablet Take 1 tablet (10 mg total) by mouth daily as needed for erectile dysfunction. 03/19/12  Yes [name], PA-C  zoster vaccine live, PF, (ZOSTAVAX) 44034 UNT/0.65ML injection Inject 19,400 Units into the skin once. Patient not taking: Reported on 03/18/2015 09/15/14    [name], PA-C           Allergies  Allergen Reactions  . Codeine Other (See Comments)    Dizziness; faint  . Methocarbamol Other (See Comments)    HA, tinnitus  . Morphine And Related Other (See Comments)    Severe Headache  . Nsaids     BRBPR    Objective:  Physical Exam  Constitutional: He is oriented to person, place, and time. He appears well-developed and well-nourished.  HENT:  Head: Normocephalic and atraumatic.  Cardiovascular: Normal rate and regular rhythm.   Pulmonary/Chest: Effort normal and breath sounds normal.  Neurological: He is alert and oriented to person, place, and time.  Skin: Skin is warm and dry.  Psychiatric: He has a normal mood and affect. His behavior is normal. Thought content normal.     BP 126/74 mmHg  Pulse 74  Temp(Src) 97.9 F (36.6 C) (Oral)  Resp 16  Ht 5' 11.25" (1.81 m)  Wt 254 lb 9.6 oz (115.486 kg)  BMI 35.25 kg/m2  SpO2 97%   Results for orders placed or performed in visit on 03/18/15  POCT glucose (manual entry)  Result Value Ref Range   POC Glucose 113 (A) 70 - 99 mg/dl  POCT glycosylated hemoglobin (Hb A1C)  Result Value Ref Range   Hemoglobin A1C 6.4      Assessment & Plan:  1. Mixed hyperlipidemia Await lab results. - Lipid panel - Comprehensive metabolic panel  2. Hyperglycemia - POCT glucose (manual entry) - POCT glycosylated hemoglobin (Hb A1C)  3. Diabetes mellitus type 2, uncomplicated Fasting blood glucose has been elevated x2. Medication is not needed at this time. Patient will manage with diet and exercise. If elevated blood glucose readings continue, expect to add medication. - Ambulatory referral to diabetic education  4. Encopresis Unclear etiology. Plan to call Dr. Ellene Route to discuss plan to evaluate problem.  5. Degenerative disc disease, lumbar Continue Ibuprofen 800 mg daily for joint pain. We will re-assess once GI issues of encopresis are evaluated.   6. Heart  palpitations Extensive family history of CVD as well as patient risk factors of DM and hyperlipidemia. - Ambulatory referral to Cardiology  7. Family history of ASCVD (arteriosclerotic cardiovascular disease) - Ambulatory referral to Cardiology  8. Flu vaccine need - Flu Vaccine QUAD 36+ mos IM  9. Need for shingles vaccine - zoster vaccine live, PF, (ZOSTAVAX) 74259 UNT/0.65ML injection; Inject 19,400 Units into the skin once.  Dispense: 1 each; Refill: 0  10. Screening for HIV (human immunodeficiency virus) - HIV antibody    Amber D. Race, PA-S Physician Assistant Student [facility]

## 2015-03-18 NOTE — Telephone Encounter (Signed)
Craig Roberson spoke with Dr. Ellene Route, advising him of the loss of control of stool. He does not think that it is related to his back disease because he has not lost total control, just with exertion. He advises to proceed with GI evaluation.  Please call this patient. Tell him that I spoke with Dr. Clarice Pole assistant and that Dr. Ellene Route does not think the leakage of stool is from his back problem. So, I have referred him back to Dr. Oletta Lamas office for additional evaluation.  They should contact him directly.

## 2015-03-18 NOTE — Telephone Encounter (Signed)
Is there a specific physician there he would like to see?  [facility] have joined and are now [facility]  I believe that when he is called to schedule the visit, he can specify who he would like to see.

## 2015-03-19 NOTE — Telephone Encounter (Signed)
Left message for pt to call back  °

## 2015-03-24 ENCOUNTER — Other Ambulatory Visit: Payer: Self-pay | Admitting: Physician Assistant

## 2015-03-24 NOTE — Telephone Encounter (Signed)
Spoke with pt, advised message from Chelle. Pt understood. 

## 2015-04-05 ENCOUNTER — Telehealth: Payer: Self-pay

## 2015-04-05 ENCOUNTER — Other Ambulatory Visit: Payer: Self-pay | Admitting: Physician Assistant

## 2015-04-05 NOTE — Telephone Encounter (Signed)
I am not sure why this didn't already happen. I reviewed the labs, but do not see any communication about the results.   Please let him know that I am sorry, that I am so glad that he called, and that I'm getting the results ready right now! Letter routed to [employer].

## 2015-04-05 NOTE — Telephone Encounter (Signed)
Spoke with pt, advised message from Chelle. 

## 2015-04-05 NOTE — Telephone Encounter (Signed)
Pt would like a copy of his labs mailed to him. Please review. Thanks

## 2015-04-06 ENCOUNTER — Encounter: Payer: Self-pay | Admitting: Cardiology

## 2015-04-06 ENCOUNTER — Ambulatory Visit (INDEPENDENT_AMBULATORY_CARE_PROVIDER_SITE_OTHER): Payer: 59 | Admitting: Cardiology

## 2015-04-06 ENCOUNTER — Other Ambulatory Visit: Payer: Self-pay | Admitting: Physician Assistant

## 2015-04-06 VITALS — BP 134/80 | HR 79 | Ht 69.5 in | Wt 251.8 lb

## 2015-04-06 DIAGNOSIS — R002 Palpitations: Secondary | ICD-10-CM

## 2015-04-06 DIAGNOSIS — Z8249 Family history of ischemic heart disease and other diseases of the circulatory system: Secondary | ICD-10-CM

## 2015-04-06 DIAGNOSIS — E119 Type 2 diabetes mellitus without complications: Secondary | ICD-10-CM

## 2015-04-06 NOTE — Patient Instructions (Signed)
Medication Instructions:  Your physician recommends that you continue on your current medications as directed. Please refer to the Current Medication list given to you today.   Labwork: None  Testing/Procedures: Dr. [name] recommends you have an Levy.  Your physician has recommended that you wear an event monitor. Event monitors are medical devices that record the heart's electrical activity. Doctors most often [place] these monitors to diagnose arrhythmias. Arrhythmias are problems with the speed or rhythm of the heartbeat. The monitor is a small, portable device. You can wear one while you do your normal daily activities. This is usually used to diagnose what is causing palpitations/syncope (passing out).  Dr. [name] recommends you have a CALCIUM SCORE.  Follow-Up: Your physician recommends that you schedule a follow-up appointment AS NEEDED with Dr. [name] pending study results.   Any Other Special Instructions Will Be Listed Below (If Applicable).

## 2015-04-06 NOTE — Progress Notes (Addendum)
Cardiology Office Note   Date:  04/06/2015   ID:  Jacquese Hackman Flippin, DOB 1956-01-28, MRN [id number]  PCP:  JEFFERY,CHELLE, PA-C    Chief Complaint  Patient presents with  . Palpitations      History of Present Illness: Craig Roberson is a 59 y.o. male who presents for evaluation of cardiac risk factors.  He has a history of dyslipidemia, type 2 DM, family history of ASCAD (Father at age 68 and brother with MI in his 30's) and remote tobacco use.  He also has noted some palpitations but was drinking 4-5 pepsi a day.  He has cut back on the caffeine and palpitations have improved.  He denies any chest pain or pressure, SOB, DOE, dizziness or syncope.  He has occasional LE edema from prior back surgery.      Past Medical History  Diagnosis Date  . Arthritis     bilateral thumbs  . Allergy   . Cardiac murmur 1996    normal ECHO  . Hyperlipidemia   . ED (erectile dysfunction) 2006  . H/O ETOH abuse     Quit 2006  . Tinnitus of both ears   . BCC (basal cell carcinoma of skin) 04/2006    LEFT low back  . Ulcer   . Macular degeneration   . Diverticulosis   . Hernia     RIGHT inguinal    Past Surgical History  Procedure Laterality Date  . Ganglion cyst excision  1970's  . Thumb fusion      BILATERAL  . Lumbar fusion      L3-4; Dr. Ellene Route  . Vasectomy  1976  . Spine surgery       Current Outpatient Prescriptions  Medication Sig Dispense Refill  . azelastine (ASTELIN) 0.1 % nasal spray Place 1 spray into both nostrils 2 (two) times daily as needed for rhinitis. Use in each nostril as directed    . cholecalciferol (VITAMIN D) 1000 UNITS tablet Take 1,000 Units by mouth daily.    . diazepam (VALIUM) 10 MG tablet Take 10 mg by mouth every 8 (eight) hours as needed. (RINGING IN EARS)    . ezetimibe-simvastatin (VYTORIN) 10-40 MG per tablet Take 1 tablet by mouth at bedtime. 30 tablet 5  . ibuprofen (ADVIL,MOTRIN) 800 MG tablet Take 800 mg by  mouth every 8 (eight) hours as needed (PAIN).    . Omega-3 Fatty Acids (FISH OIL) 1000 MG CAPS Take 1,000 mg by mouth daily.     No current facility-administered medications for this visit.    Allergies:   Codeine; Methocarbamol; Morphine and related; and Nsaids    Social History:  The patient  reports that he has quit smoking. He has quit using smokeless tobacco. His smokeless tobacco use included Chew. He reports that he does not drink alcohol or use illicit drugs.   Family History:  The patient's family history includes Atrial fibrillation in his brother; Cancer in his father; Heart disease (age of onset: 29) in his father; Heart disease (age of onset: 24) in his brother; Hyperlipidemia in his mother; Stroke in his father; Stroke (age of onset: 51) in his son; Vascular Disease in his brother.    ROS:  Please see the history of present illness.   Otherwise, review of systems are positive for none.   All other systems are reviewed and negative.    PHYSICAL EXAM: VS:  BP 134/80 mmHg  Pulse 79  Ht 5' 9.5" (1.765 m)  Wt 251 lb 12.8 oz (114.216 kg)  BMI 36.66 kg/m2  SpO2 94% , BMI Body mass index is 36.66 kg/(m^2). GEN: Well nourished, well developed, in no acute distress HEENT: normal Neck: no JVD, carotid bruits, or masses Cardiac: RRR; no murmurs, rubs, or gallops,no edema  Respiratory:  clear to auscultation bilaterally, normal work of breathing GI: soft, nontender, nondistended, + BS MS: no deformity or atrophy Skin: warm and dry, no rash Neuro:  Strength and sensation are intact Psych: euthymic mood, full affect   EKG:  EKG is not ordered today.    Recent Labs: 03/18/2015: ALT 20; BUN 13; Creat 0.89; Potassium 4.3; Sodium 138    Lipid Panel    Component Value Date/Time   CHOL 178 03/18/2015 0900   TRIG 172* 03/18/2015 0900   HDL 37* 03/18/2015 0900   CHOLHDL 4.8 03/18/2015 0900   VLDL 34* 03/18/2015 0900   LDLCALC 107 03/18/2015 0900      Wt Readings from  Last 3 Encounters:  04/06/15 251 lb 12.8 oz (114.216 kg)  03/18/15 254 lb 9.6 oz (115.486 kg)  09/15/14 230 lb 9.6 oz (104.599 kg)         ASSESSMENT AND PLAN:  1.  Family history of CAD at an early age.  He has other risk factors for cardiac disease other than family hx including type 2 DM and remote tobacco use. EKG in March 2016 was normal. I will get a Stress myoview to rule out ischemia. I will also check a coronary calcium score for risk assessment 2.  Dyslipidemia on statin therapy 3.  Type 2 DM 4.  Palpitations most likely related to caffeine.  I will get an  Event monitor to assess for arrhythmias.   Current medicines are reviewed at length with the patient today.  The patient does not have concerns regarding medicines.  The following changes have been made:  no change  Labs/ tests ordered today: See above Assessment and Plan No orders of the defined types were placed in this encounter.     Disposition:   FU with me PRN pending results of studies.   Lurena Nida, MD  04/06/2015 3:24 PM    [facility] [address], [place],   [address] Phone: [phone]; Fax: [phone]

## 2015-04-07 NOTE — Telephone Encounter (Signed)
Faxed Valium RF

## 2015-04-09 ENCOUNTER — Other Ambulatory Visit: Payer: Self-pay

## 2015-04-09 DIAGNOSIS — E782 Mixed hyperlipidemia: Secondary | ICD-10-CM

## 2015-04-09 MED ORDER — EZETIMIBE-SIMVASTATIN 10-40 MG PO TABS: 1.0000 | ORAL_TABLET | Freq: Every day | ORAL | Status: DC

## 2015-04-13 ENCOUNTER — Telehealth (HOSPITAL_COMMUNITY): Payer: Self-pay | Admitting: *Deleted

## 2015-04-13 NOTE — Telephone Encounter (Signed)
Patient given detailed instructions per Myocardial Perfusion Study Information Sheet for test on 04/15/15 at 0730. Patient notified to arrive 15 minutes early and that it is imperative to arrive on time for appointment to keep from having the test rescheduled.  If you need to cancel or reschedule your appointment, please call the office within 24 hours of your appointment. Failure to do so may result in a cancellation of your appointment, and a $50 no show fee. Patient verbalized understanding. Timouthy Gilardi, Ranae Palms

## 2015-04-15 ENCOUNTER — Ambulatory Visit (HOSPITAL_COMMUNITY): Payer: 59 | Attending: Cardiology

## 2015-04-15 ENCOUNTER — Ambulatory Visit (INDEPENDENT_AMBULATORY_CARE_PROVIDER_SITE_OTHER): Payer: 59

## 2015-04-15 ENCOUNTER — Encounter: Payer: Self-pay | Admitting: Cardiology

## 2015-04-15 ENCOUNTER — Ambulatory Visit (INDEPENDENT_AMBULATORY_CARE_PROVIDER_SITE_OTHER): Payer: 59 | Admitting: Cardiology

## 2015-04-15 VITALS — Ht 69.5 in | Wt 251.0 lb

## 2015-04-15 DIAGNOSIS — E782 Mixed hyperlipidemia: Secondary | ICD-10-CM | POA: Diagnosis not present

## 2015-04-15 DIAGNOSIS — Z8249 Family history of ischemic heart disease and other diseases of the circulatory system: Secondary | ICD-10-CM | POA: Diagnosis present

## 2015-04-15 DIAGNOSIS — I480 Paroxysmal atrial fibrillation: Secondary | ICD-10-CM | POA: Diagnosis not present

## 2015-04-15 DIAGNOSIS — I10 Essential (primary) hypertension: Secondary | ICD-10-CM | POA: Diagnosis not present

## 2015-04-15 DIAGNOSIS — R002 Palpitations: Secondary | ICD-10-CM

## 2015-04-15 DIAGNOSIS — I4891 Unspecified atrial fibrillation: Secondary | ICD-10-CM

## 2015-04-15 HISTORY — DX: Paroxysmal atrial fibrillation: I48.0

## 2015-04-15 LAB — MYOCARDIAL PERFUSION IMAGING
CHL CUP NUCLEAR SRS: 3
CHL CUP NUCLEAR SSS: 5
CHL CUP STRESS STAGE 1 DBP: 76 mmHg
CHL CUP STRESS STAGE 1 GRADE: 0 %
CHL CUP STRESS STAGE 1 HR: 75 {beats}/min
CHL CUP STRESS STAGE 1 SBP: 132 mmHg
CHL CUP STRESS STAGE 1 SPEED: 0 mph
CHL CUP STRESS STAGE 2 DBP: 84 mmHg
CHL CUP STRESS STAGE 2 HR: 69 {beats}/min
CHL CUP STRESS STAGE 2 SBP: 142 mmHg
CHL CUP STRESS STAGE 3 GRADE: 0 %
CHL CUP STRESS STAGE 4 GRADE: 10 %
CHL CUP STRESS STAGE 4 HR: 114 {beats}/min
CHL CUP STRESS STAGE 5 HR: 226 {beats}/min
CHL CUP STRESS STAGE 5 SBP: 209 mmHg
CHL CUP STRESS STAGE 5 SPEED: 2.5 mph
CHL CUP STRESS STAGE 6 DBP: 75 mmHg
CHL CUP STRESS STAGE 6 GRADE: 0 %
CHL CUP STRESS STAGE 6 HR: 190 {beats}/min
CHL CUP STRESS STAGE 7 DBP: 74 mmHg
CHL CUP STRESS STAGE 7 HR: 77 {beats}/min
CSEPEW: 7 METS
CSEPHR: 141 %
CSEPPBP: 209 mmHg
CSEPPHR: 226 {beats}/min
CSEPPMHR: 141 %
Exercise duration (min): 6 min
Exercise duration (sec): 0 s
LHR: 0.32
LVDIAVOL: 123 mL
LVSYSVOL: 50 mL
MPHR: 160 {beats}/min
NUC STRESS TID: 1.02
RPE: 19
Rest HR: 67 {beats}/min
SDS: 2
Stage 2 Grade: 0 %
Stage 2 Speed: 0 mph
Stage 3 HR: 69 {beats}/min
Stage 3 Speed: 0 mph
Stage 4 Speed: 1.7 mph
Stage 5 DBP: 74 mmHg
Stage 5 Grade: 12 %
Stage 6 SBP: 168 mmHg
Stage 6 Speed: 0 mph
Stage 7 Grade: 0 %
Stage 7 SBP: 108 mmHg
Stage 7 Speed: 0 mph

## 2015-04-15 MED ORDER — TECHNETIUM TC 99M SESTAMIBI GENERIC - CARDIOLITE
10.8000 | Freq: Once | INTRAVENOUS | Status: AC | PRN
  Administered 2015-04-15: 11 via INTRAVENOUS

## 2015-04-15 MED ORDER — DILTIAZEM HCL 25 MG/5ML IV SOLN
10.0000 mg | Freq: Once | INTRAVENOUS | Status: AC
  Administered 2015-04-15: 10 mg via INTRAVENOUS

## 2015-04-15 MED ORDER — METOPROLOL TARTRATE 5 MG/5ML IV SOLN
2.5000 mg | Freq: Once | INTRAVENOUS | Status: AC
  Administered 2015-04-15: 2.5 mg via INTRAVENOUS

## 2015-04-15 MED ORDER — METOPROLOL SUCCINATE ER 25 MG PO TB24: 25.0000 mg | ORAL_TABLET | Freq: Every day | ORAL | Status: DC

## 2015-04-15 MED ORDER — TECHNETIUM TC 99M SESTAMIBI GENERIC - CARDIOLITE
32.2000 | Freq: Once | INTRAVENOUS | Status: AC | PRN
  Administered 2015-04-15: 32.2 via INTRAVENOUS

## 2015-04-15 MED FILL — Metoprolol Tartrate IV Soln 5 MG/5ML: INTRAVENOUS | Qty: 5 | Status: AC

## 2015-04-15 NOTE — Telephone Encounter (Signed)
This encounter was created in error - please disregard.

## 2015-04-15 NOTE — Progress Notes (Signed)
Cardiology Office Note   Date:  04/15/2015   ID:  Craig Roberson, DOB 19-Nov-1955, MRN [id number]  PCP:  JEFFERY,CHELLE, PA-C    No chief complaint on file.     History of Present Illness: Craig Roberson is a 59 y.o. male who presents for evaluation of atrial fibrillation.  He has a history of palpitations and was supposed to be placed on a  Heart monitor today in the office after his stress myoview.  He was in NSR on arrival and during the ETT he went into atrial fibrillation with RVR.  He was completely asymptomatic.  He was given Cardizem IV 10mg  and then Metoprolol 2.5mg  IV with conversion to NSR.      Past Medical History  Diagnosis Date  . Arthritis     bilateral thumbs  . Allergy   . Cardiac murmur 1996    normal ECHO  . Hyperlipidemia   . ED (erectile dysfunction) 2006  . H/O ETOH abuse     Quit 2006  . Tinnitus of both ears   . BCC (basal cell carcinoma of skin) 04/2006    LEFT low back  . Ulcer   . Macular degeneration   . Diverticulosis   . Hernia     RIGHT inguinal    Past Surgical History  Procedure Laterality Date  . Ganglion cyst excision  1970's  . Thumb fusion      BILATERAL  . Lumbar fusion      L3-4; Dr. Ellene Route  . Vasectomy  1976  . Spine surgery       Current Outpatient Prescriptions  Medication Sig Dispense Refill  . azelastine (ASTELIN) 0.1 % nasal spray Place 1 spray into both nostrils 2 (two) times daily as needed for rhinitis. Use in each nostril as directed    . cholecalciferol (VITAMIN D) 1000 UNITS tablet Take 1,000 Units by mouth daily.    . diazepam (VALIUM) 10 MG tablet Take 10 mg by mouth every 8 (eight) hours as needed. (RINGING IN EARS)    . ezetimibe-simvastatin (VYTORIN) 10-40 MG tablet Take 1 tablet by mouth at bedtime. 90 tablet 1  . ibuprofen (ADVIL,MOTRIN) 800 MG tablet Take 800 mg by mouth every 8 (eight) hours as needed (PAIN).    Marland Kitchen metoprolol succinate (TOPROL XL) 25 MG 24 hr tablet  Take 1 tablet (25 mg total) by mouth daily. 30 tablet 6  . Omega-3 Fatty Acids (FISH OIL) 1000 MG CAPS Take 1,000 mg by mouth daily.     No current facility-administered medications for this visit.    Allergies:   Codeine; Methocarbamol; Morphine and related; and Nsaids    Social History:  The patient  reports that he has quit smoking. He has quit using smokeless tobacco. His smokeless tobacco use included Chew. He reports that he does not drink alcohol or use illicit drugs.   Family History:  The patient's family history includes Atrial fibrillation in his brother; Cancer in his father; Heart disease (age of onset: 38) in his father; Heart disease (age of onset: 38) in his brother; Hyperlipidemia in his mother; Stroke in his father; Stroke (age of onset: 42) in his son; Vascular Disease in his brother.    ROS:  Please see the history of present illness.   Otherwise, review of systems are positive for none.   All other systems are reviewed and negative.    PHYSICAL  EXAM: VS:  There were no vitals taken for this visit. , BMI There is no weight on file to calculate BMI. GEN: Well nourished, well developed, in no acute distress HEENT: normal Neck: no JVD, carotid bruits, or masses Cardiac: RRR; no murmurs, rubs, or gallops,no edema  Respiratory:  clear to auscultation bilaterally, normal work of breathing GI: soft, nontender, nondistended, + BS MS: no deformity or atrophy Skin: warm and dry, no rash Neuro:  Strength and sensation are intact Psych: euthymic mood, full affect   EKG:  EKG is ordered today. The ekg ordered today demonstrates atrial fibrillation with RVR   Recent Labs: 03/18/2015: ALT 20; BUN 13; Creat 0.89; Potassium 4.3; Sodium 138    Lipid Panel    Component Value Date/Time   CHOL 178 03/18/2015 0900   TRIG 172* 03/18/2015 0900   HDL 37* 03/18/2015 0900   CHOLHDL 4.8 03/18/2015 0900   VLDL 34* 03/18/2015 0900   LDLCALC 107 03/18/2015 0900      Wt Readings  from Last 3 Encounters:  04/15/15 251 lb (113.853 kg)  04/06/15 251 lb 12.8 oz (114.216 kg)  03/18/15 254 lb 9.6 oz (115.486 kg)       ASSESSMENT AND PLAN:  1.  New onset atrial fibrillation with RVR during exercise treadmill.  He converted to NSR after IV Cardizem and Lopressor.  He was asymptomatic. His CHADS2VASC score is 1 (DM) so no long term anticoagulation indicated at this time.  Will start Toprol XL 25mg  daily.  Check 2D echo to assess LA size.  He will get monitor placed today to assess for PAF breakthrough on BB. 2.  Family history of CAD and type 2DM without any symptoms of chest pain - s/p stress myoview this am. 3.  DM per PCP 4.  Palpitations - ? PAF - placing event monitor today.   Current medicines are reviewed at length with the patient today.  The patient does not have concerns regarding medicines.  The following changes have been made:  no change  Labs/ tests ordered today: See above Assessment and Plan Orders Placed This Encounter  Procedures  . ECHOCARDIOGRAM COMPLETE     Disposition:   FU with me in 4 weeks  Signed, Sueanne Margarita, MD  04/15/2015 1:49 PM    [facility] [place], [place], [place]  [address] Phone: [phone]; Fax: [phone]

## 2015-04-15 NOTE — Patient Instructions (Addendum)
Medication Instructions:  Your physician has recommended you make the following change in your medication: 1) START TOPROL 25 mg daily  Labwork: None  Testing/Procedures: Your physician has requested that you have an echocardiogram. Echocardiography is a painless test that uses sound waves to create images of your heart. It provides your doctor with information about the size and shape of your heart and how well your heart's chambers and valves are working. This procedure takes approximately one hour. There are no restrictions for this procedure.  Follow-Up: You have an appointment scheduled with Dr. [name] on 11/23 at 1:15PM.  Any Other Special Instructions Will Be Listed Below (If Applicable).

## 2015-04-19 ENCOUNTER — Telehealth: Payer: Self-pay | Admitting: Cardiology

## 2015-04-19 NOTE — Telephone Encounter (Signed)
Follow Up  Pt called states that he is returning the call for stress test results

## 2015-04-19 NOTE — Telephone Encounter (Signed)
Informed patient of results and verbal understanding expressed.  Scheduled EKG this Thursday. Patient agrees with treatment plan.

## 2015-04-19 NOTE — Telephone Encounter (Signed)
-----   Message from Sueanne Margarita, MD sent at 04/16/2015  3:14 PM EDT ----- Please let patient know that stress test was fine  Patient needs to come in to document that he is in NSR

## 2015-04-21 ENCOUNTER — Other Ambulatory Visit: Payer: 59

## 2015-04-22 ENCOUNTER — Other Ambulatory Visit: Payer: Self-pay

## 2015-04-22 ENCOUNTER — Ambulatory Visit (INDEPENDENT_AMBULATORY_CARE_PROVIDER_SITE_OTHER): Payer: 59 | Admitting: *Deleted

## 2015-04-22 ENCOUNTER — Ambulatory Visit (INDEPENDENT_AMBULATORY_CARE_PROVIDER_SITE_OTHER)
Admission: RE | Admit: 2015-04-22 | Discharge: 2015-04-22 | Disposition: A | Discharge status: Home / Self Care | Payer: 59 | Source: Ambulatory Visit | Attending: Cardiology | Admitting: Cardiology

## 2015-04-22 ENCOUNTER — Ambulatory Visit (HOSPITAL_COMMUNITY): Payer: 59 | Attending: Cardiovascular Disease

## 2015-04-22 DIAGNOSIS — E669 Obesity, unspecified: Secondary | ICD-10-CM | POA: Diagnosis not present

## 2015-04-22 DIAGNOSIS — R011 Cardiac murmur, unspecified: Secondary | ICD-10-CM | POA: Diagnosis not present

## 2015-04-22 DIAGNOSIS — R002 Palpitations: Secondary | ICD-10-CM

## 2015-04-22 DIAGNOSIS — E119 Type 2 diabetes mellitus without complications: Secondary | ICD-10-CM | POA: Insufficient documentation

## 2015-04-22 DIAGNOSIS — Z87891 Personal history of nicotine dependence: Secondary | ICD-10-CM | POA: Diagnosis not present

## 2015-04-22 DIAGNOSIS — E785 Hyperlipidemia, unspecified: Secondary | ICD-10-CM | POA: Diagnosis not present

## 2015-04-22 DIAGNOSIS — I517 Cardiomegaly: Secondary | ICD-10-CM | POA: Insufficient documentation

## 2015-04-22 DIAGNOSIS — Z8249 Family history of ischemic heart disease and other diseases of the circulatory system: Secondary | ICD-10-CM | POA: Diagnosis not present

## 2015-04-22 DIAGNOSIS — I480 Paroxysmal atrial fibrillation: Secondary | ICD-10-CM | POA: Diagnosis not present

## 2015-04-22 DIAGNOSIS — Z6837 Body mass index (BMI) 37.0-37.9, adult: Secondary | ICD-10-CM | POA: Diagnosis not present

## 2015-04-22 DIAGNOSIS — I371 Nonrheumatic pulmonary valve insufficiency: Secondary | ICD-10-CM | POA: Diagnosis not present

## 2015-04-22 NOTE — Progress Notes (Signed)
Patient brought in for EKG as a result of stress test result, per Dr [name].  Performed EKG with result of Sinus Bradycardia otherwise normal.  Was reviewed by Dr [name].  No change in therapy.  Patient released to home.

## 2015-04-24 ENCOUNTER — Telehealth: Payer: Self-pay | Admitting: Internal Medicine

## 2015-04-24 NOTE — Telephone Encounter (Signed)
Per [employer] patient had a run of possible atrial flutter. Told the [employer] rep Gaspar Bidding to call the patient and direct to the nearest ED.

## 2015-04-25 NOTE — Telephone Encounter (Signed)
Please find out if patient ever went to the ER

## 2015-04-26 ENCOUNTER — Encounter: Payer: Self-pay | Admitting: Cardiology

## 2015-04-26 NOTE — Telephone Encounter (Signed)
Left message to call back  

## 2015-04-26 NOTE — Telephone Encounter (Signed)
This encounter was created in error - please disregard.

## 2015-04-26 NOTE — Telephone Encounter (Signed)
F/u    Pt returning Katy's call.

## 2015-04-26 NOTE — Telephone Encounter (Signed)
Patient st he was fornicating when he broke into afib over the weekend.  He was only out of rhythm for a few minutes before he converted back to NSR on his own.  He is still in a normal, regular rhythm. Instructed patient to go to the ED if this happens again and the rhythm sustains.  Informed patient he will be called back if Dr. [name] has further instructions.

## 2015-04-27 NOTE — Telephone Encounter (Signed)
Please get monitor strips and place on my cart for review

## 2015-04-27 NOTE — Telephone Encounter (Signed)
Monitor strips placed on cart.

## 2015-05-05 ENCOUNTER — Telehealth: Payer: Self-pay | Admitting: Cardiology

## 2015-05-05 NOTE — Telephone Encounter (Signed)
Patient cannot come to Afib Clinic this week. Scheduled patient Monday, 11/14 at 0930 with Roderic Palau.

## 2015-05-05 NOTE — Telephone Encounter (Signed)
Patient having breakthrough atrial fibrillation/flutter on heart monitor - please get into afib clinic tomorrow

## 2015-05-05 NOTE — Telephone Encounter (Signed)
Left message to call back  

## 2015-05-10 ENCOUNTER — Other Ambulatory Visit: Payer: Self-pay | Admitting: Physician Assistant

## 2015-05-10 ENCOUNTER — Encounter (HOSPITAL_COMMUNITY): Payer: Self-pay | Admitting: Nurse Practitioner

## 2015-05-10 ENCOUNTER — Ambulatory Visit (HOSPITAL_COMMUNITY)
Admission: RE | Admit: 2015-05-10 | Discharge: 2015-05-10 | Disposition: A | Discharge status: Home / Self Care | Payer: 59 | Source: Ambulatory Visit | Attending: Nurse Practitioner | Admitting: Nurse Practitioner

## 2015-05-10 VITALS — BP 126/80 | HR 69 | Ht 69.0 in | Wt 251.4 lb

## 2015-05-10 DIAGNOSIS — I480 Paroxysmal atrial fibrillation: Secondary | ICD-10-CM | POA: Insufficient documentation

## 2015-05-10 NOTE — Progress Notes (Signed)
Patient ID: Craig Roberson, male   DOB: 1956-04-11, 59 y.o.   MRN: [id number]     Primary Care Physician: Craig Roberson Referring Physician: Dr. Earl Lites Roberson is a 59 y.o. male with a h/o PAF, currently wearing an event monitor, which showed afib/flutter, for Dr. Radford Roberson, in the [place] clinic today for evaluation. He gives a history of irregular heart beat, usually brief, lasting around 10 minutes, over the last few years. He is aware of the palpitations, but does not notice any fatigue or dyspnea.  He did have a stress test recently and did go into afib and with  IV meds converted to SR. He was started on metoprolol 25 mg qd. Since that time, has noticed less palpitations. Chadsvasc score is 0 and by guidelines does not require a blood thinner.  He denies any current alcohol use. Has been drinking a lot of regular Pepsi's, did dip snuff for 10 years but quit last April. States he snores, but does not think apnea is present, but he and his wife sleep apart because she snores too. Continues to wear event monitor.  Today, he denies symptoms of palpitations, chest pain, shortness of breath, orthopnea, PND, lower extremity edema, dizziness, presyncope, syncope, or neurologic sequela. The patient is tolerating medications without difficulties and is otherwise without complaint today.   Past Medical History  Diagnosis Date  . Arthritis     bilateral thumbs  . Allergy   . Cardiac murmur 1996    normal ECHO  . Hyperlipidemia   . ED (erectile dysfunction) 2006  . H/O ETOH abuse     Quit 2006  . Tinnitus of both ears   . BCC (basal cell carcinoma of skin) 04/2006    LEFT low back  . Ulcer   . Macular degeneration   . Diverticulosis   . Hernia     RIGHT inguinal  . PAF (paroxysmal atrial fibrillation) ([place]) 04/15/2015    During exercise myoview   Past Surgical History  Procedure Laterality Date  . Ganglion cyst excision  1970's  . Thumb fusion     BILATERAL  . Lumbar fusion      L3-4; Dr. Ellene Route  . Vasectomy  1976  . Spine surgery      Current Outpatient Prescriptions  Medication Sig Dispense Refill  . azelastine (ASTELIN) 0.1 % nasal spray Place 1 spray into both nostrils 2 (two) times daily as needed for rhinitis. Use in each nostril as directed    . cholecalciferol (VITAMIN D) 1000 UNITS tablet Take 1,000 Units by mouth daily.    . diazepam (VALIUM) 10 MG tablet Take 10 mg by mouth every 8 (eight) hours as needed. (RINGING IN EARS)    . ezetimibe-simvastatin (VYTORIN) 10-40 MG tablet Take 1 tablet by mouth at bedtime. 90 tablet 1  . ibuprofen (ADVIL,MOTRIN) 800 MG tablet Take 800 mg by mouth every 8 (eight) hours as needed (PAIN).    Marland Kitchen metoprolol succinate (TOPROL XL) 25 MG 24 hr tablet Take 1 tablet (25 mg total) by mouth daily. 30 tablet 6  . Omega-3 Fatty Acids (FISH OIL) 1000 MG CAPS Take 1,000 mg by mouth daily.     No current facility-administered medications for this encounter.    Allergies  Allergen Reactions  . Codeine Other (See Comments)    Dizziness; faint  . Methocarbamol Other (See Comments)    HA, tinnitus  . Morphine And Related Other (See Comments)    Severe Headache  .  Nsaids     BRBPR    Social History   Social History  . Marital Status: Married    Spouse Name: [name]  . Number of Children: 2  . Years of Education: 8   Occupational History  . Retired     Careers information officer   Social History Main Topics  . Smoking status: Former Research scientist (life sciences)  . Smokeless tobacco: Former Systems developer    Types: Chew     Comment: some days  . Alcohol Use: No  . Drug Use: No  . Sexual Activity:    Partners: Female     Comment: family stress; his back pain have reduced the frequency   Other Topics Concern  . Not on file   Social History Narrative   Married to 3rd wife.    Family History  Problem Relation Age of Onset  . Heart disease Brother 86    AMI, normal weight, normal cholesterol  . Heart disease  Father 64  . Cancer Father     Prostate  . Stroke Father   . Stroke Son 72  . Hyperlipidemia Mother   . Atrial fibrillation Brother   . Vascular Disease Brother     carotid artery disease    ROS- All systems are reviewed and negative except as per the HPI above  Physical Exam: Filed Vitals:   05/10/15 0903  BP: 126/80  Pulse: 69  Height: 5\' 9"  (1.753 m)  Weight: 251 lb 6.4 oz (114.034 kg)    GEN- The patient is well appearing, alert and oriented x 3 today.   Head- normocephalic, atraumatic Eyes-  Sclera clear, conjunctiva pink Ears- hearing intact Oropharynx- clear Neck- supple, no JVP Lymph- no cervical lymphadenopathy Lungs- Clear to ausculation bilaterally, normal work of breathing Heart- Regular rate and rhythm, no murmurs, rubs or gallops, PMI not laterally displaced GI- soft, NT, ND, + BS Extremities- no clubbing, cyanosis, or edema MS- no significant deformity or atrophy Skin- no rash or lesion Psych- euthymic mood, full affect Neuro- strength and sensation are intact  EKG-NSR, normal ekg, rate 69 bpm, pr int 182 ms, qrs int 90 ms, qtc 437 ms  Epic records reviewed  Echo-Left ventricle: The cavity size was normal. There was mild focal basal hypertrophy of the septum. Systolic function was normal. The estimated ejection fraction was in the range of 55% to 60%. Wall motion was normal; there were no regional wall motion abnormalities. Left ventricular diastolic function parameters were normal. - Right ventricle: The cavity size was normal. Wall thickness was normal. Systolic function was normal. - Atrial septum: No defect or patent foramen ovale was identified. - Tricuspid valve: There was no regurgitation. - Pulmonic valve: There was trivial regurgitation. - Inferior vena cava: The vessel was normal in size. The respirophasic diameter changes were in the normal range (>= 50%), consistent with normal central venous pressure.  Event monitor  still in progress   Nuclear stress EF: 59%.  The study is normal.  This is a low risk study.  The left ventricular ejection fraction is normal (55-65%).  Normal stress perfusion images Resting images poor quality with low counts and artifact EF 59% no RWMA;s Patient developed prolonged episode of PAF with fast rates and some ST changes lasting about 20 minutes Rx with iv cardizem and lopressor  Nursing notes indicate conversion to NSR but last strips provided for reading showed afib still  Assessment and Plan: 1. PAF Palpitations x 7 years Pt is minimally symptomatic. episodes of short duration  Less episodes recently with addition of metoprolol, discussed he could take an extra 1/2 tab if needed for additional afib Do not think antiarrythmic's are necessary at this point unless afib increases or he becomes more symptomatic. Chadsvasc score is 0, no anticoagulation needed by guidelines  2. Lifestyle factors  Encouraged to build up a walking program Try to lose 5-10 lbs of current weight  Encouraged to continue to limit soft drinks  Can discuss whether a sleep study is indicated with Dr. Radford Roberson on f/u.  F/u with Dr. Radford Roberson as scheduled 11/23 Afib clinic as needed

## 2015-05-11 NOTE — Telephone Encounter (Signed)
Rx printed at 104. Will bring to 102 after clinic.  Meds ordered this encounter  Medications  . diazepam (VALIUM) 10 MG tablet    Sig: TAKE 1 TABLET BY MOUTH EVERY 8 HOURS AS NEEDED    Dispense:  30 tablet    Refill:  0    Not to exceed 5 additional fills before 10/03/2015.

## 2015-05-11 NOTE — Telephone Encounter (Signed)
Rx faxed

## 2015-05-19 ENCOUNTER — Ambulatory Visit (INDEPENDENT_AMBULATORY_CARE_PROVIDER_SITE_OTHER): Payer: 59 | Admitting: Cardiology

## 2015-05-19 ENCOUNTER — Encounter: Payer: Self-pay | Admitting: Cardiology

## 2015-05-19 VITALS — BP 124/62 | HR 64 | Ht 68.0 in | Wt 250.0 lb

## 2015-05-19 DIAGNOSIS — G4719 Other hypersomnia: Secondary | ICD-10-CM

## 2015-05-19 DIAGNOSIS — R0683 Snoring: Secondary | ICD-10-CM

## 2015-05-19 DIAGNOSIS — I480 Paroxysmal atrial fibrillation: Secondary | ICD-10-CM

## 2015-05-19 DIAGNOSIS — E669 Obesity, unspecified: Secondary | ICD-10-CM

## 2015-05-19 MED ORDER — METOPROLOL SUCCINATE ER 25 MG PO TB24: 37.5000 mg | ORAL_TABLET | Freq: Every day | ORAL | Status: DC

## 2015-05-19 NOTE — Progress Notes (Signed)
Cardiology Office Note   Date:  05/19/2015   ID:  Craig Roberson, DOB Jan 06, 1956, MRN [id number]  PCP:  JEFFERY,CHELLE, PA-C    Chief Complaint  Patient presents with  . Atrial Fibrillation      History of Present Illness: Craig Roberson is a 59 y.o. male who presents for followup of atrial fibrillation. He has a history of palpitations recently had an ETT done.  He  was in NSR on arrival and during the ETT he went into atrial fibrillation with RVR. He was completely asymptomatic. He was given Cardizem IV 10mg  and then Metoprolol 2.5mg  IV with conversion to NSR. He now presents for followup.  He denies any chest pain, SOB, DOE, LE edema, dizziness, claudication or syncope.  He still has breakthrough of palpitations about 4-5 times weekly especially during sex.  He denies any dizziness or CP with the palpitations.  He has not had any further CP since his stress test.  He says that he snores at night but does have some daytime sleepiness and takes a nap.    Past Medical History  Diagnosis Date  . Arthritis     bilateral thumbs  . Allergy   . Cardiac murmur 1996    normal ECHO  . Hyperlipidemia   . ED (erectile dysfunction) 2006  . H/O ETOH abuse     Quit 2006  . Tinnitus of both ears   . BCC (basal cell carcinoma of skin) 04/2006    LEFT low back  . Ulcer   . Macular degeneration   . Diverticulosis   . Hernia     RIGHT inguinal  . PAF (paroxysmal atrial fibrillation) ([place]) 04/15/2015    During exercise myoview    Past Surgical History  Procedure Laterality Date  . Ganglion cyst excision  1970's  . Thumb fusion      BILATERAL  . Lumbar fusion      L3-4; Dr. Ellene Route  . Vasectomy  1976  . Spine surgery       Current Outpatient Prescriptions  Medication Sig Dispense Refill  . azelastine (ASTELIN) 0.1 % nasal spray Place 1 spray into both nostrils 2 (two) times daily as needed for rhinitis. Use in each nostril as directed    .  cholecalciferol (VITAMIN D) 1000 UNITS tablet Take 1,000 Units by mouth daily.    . diazepam (VALIUM) 10 MG tablet TAKE 1 TABLET BY MOUTH EVERY 8 HOURS AS NEEDED 30 tablet 0  . ezetimibe-simvastatin (VYTORIN) 10-40 MG tablet Take 1 tablet by mouth at bedtime. 90 tablet 1  . ibuprofen (ADVIL,MOTRIN) 800 MG tablet Take 800 mg by mouth every 8 (eight) hours as needed (PAIN).    Marland Kitchen metoprolol succinate (TOPROL XL) 25 MG 24 hr tablet Take 1 tablet (25 mg total) by mouth daily. 30 tablet 6  . Omega-3 Fatty Acids (FISH OIL) 1000 MG CAPS Take 1,000 mg by mouth daily.     No current facility-administered medications for this visit.    Allergies:   Codeine; Methocarbamol; Morphine and related; and Nsaids    Social History:  The patient  reports that he has quit smoking. He has quit using smokeless tobacco. His smokeless tobacco use included Chew. He reports that he does not drink alcohol or use illicit drugs.   Family History:  The patient's family history includes Atrial fibrillation in his brother; Cancer in his father; Heart disease (  age of onset: 8) in his father; Heart disease (age of onset: 8) in his brother; Hyperlipidemia in his mother; Stroke in his father; Stroke (age of onset: 73) in his son; Vascular Disease in his brother.    ROS:  Please see the history of present illness.   Otherwise, review of systems are positive for none.   All other systems are reviewed and negative.    PHYSICAL EXAM: VS:  BP 124/62 mmHg  Pulse 64  Ht 5\' 8"  (1.727 m)  Wt 113.399 kg (250 lb)  BMI 38.02 kg/m2 , BMI Body mass index is 38.02 kg/(m^2). GEN: Well nourished, well developed, in no acute distress HEENT: normal Neck: no JVD, carotid bruits, or masses Cardiac: RRR; no murmurs, rubs, or gallops,no edema  Respiratory:  clear to auscultation bilaterally, normal work of breathing GI: soft, nontender, nondistended, + BS MS: no deformity or atrophy Skin: warm and dry, no rash Neuro:  Strength and  sensation are intact Psych: euthymic mood, full affect   EKG:  EKG is not ordered today.    Recent Labs: 03/18/2015: ALT 20; BUN 13; Creat 0.89; Potassium 4.3; Sodium 138    Lipid Panel    Component Value Date/Time   CHOL 178 03/18/2015 0900   TRIG 172* 03/18/2015 0900   HDL 37* 03/18/2015 0900   CHOLHDL 4.8 03/18/2015 0900   VLDL 34* 03/18/2015 0900   LDLCALC 107 03/18/2015 0900      Wt Readings from Last 3 Encounters:  05/19/15 113.399 kg (250 lb)  05/10/15 114.034 kg (251 lb 6.4 oz)  04/15/15 113.853 kg (251 lb)    ASSESSMENT AND PLAN:  1. New onset atrial fibrillation with RVR during exercise treadmill. He converted to NSR after IV Cardizem and Lopressor. He was asymptomatic. His CHADS2VASC score is 1 (DM) so no long term anticoagulation indicated at this time. ContinueToprol XL 25mg  daily. 2D echo was normal.  Event monitor showed breakthrough of PAF.  He still has episodes of PAF a few times a week.  I have recommended that we increase his Toprol to 25mg  1 and 1/2 tablets daily.   2. Family history of CAD and type 2DM without any symptoms of chest pain - stress myoview was normal with no ischemia. 3. DM per PCP 4. Palpitations - PAF on event monitor - see above 5.  Obesity - given his obesity, snoring and PAF, I have recommended a sleep study    Current medicines are reviewed at length with the patient today.  The patient does not have concerns regarding medicines.  The following changes have been made:  no change  Labs/ tests ordered today: See above Assessment and Plan No orders of the defined types were placed in this encounter.     Disposition:   FU with me in 6 months and PA in 4 weeks  Signed, Sueanne Margarita, MD  05/19/2015 1:20 PM    [facility] [place], [place], [place]  [address] Phone: [phone]; Fax: [phone]

## 2015-05-19 NOTE — Patient Instructions (Signed)
Medication Instructions:  Your physician has recommended you make the following change in your medication:  1) INCREASE TOPROL to 25 mg ONE AND A HALF TABLETS DAILY (for a total of 37.5 mg daily)  Labwork: None  Testing/Procedures: Your physician has recommended that you have a sleep study. This test records several body functions during sleep, including: brain activity, eye movement, oxygen and carbon dioxide blood levels, heart rate and rhythm, breathing rate and rhythm, the flow of air through your mouth and nose, snoring, body muscle movements, and chest and belly movement.  Follow-Up: Your physician recommends that you schedule a follow-up appointment in 4 weeks with an APP.  Your physician wants you to follow-up in: 6 months with Dr. [name]. You will receive a reminder letter in the mail two months in advance. If you don't receive a letter, please call our office to schedule the follow-up appointment.   Any Other Special Instructions Will Be Listed Below (If Applicable).     If you need a refill on your cardiac medications before your next appointment, please call your pharmacy.

## 2015-05-24 ENCOUNTER — Telehealth: Payer: Self-pay | Admitting: Cardiology

## 2015-05-24 MED ORDER — METOPROLOL SUCCINATE ER 50 MG PO TB24: 50.0000 mg | ORAL_TABLET | Freq: Every day | ORAL | Status: DC

## 2015-05-24 NOTE — Telephone Encounter (Signed)
New Message   Pt calling to return the call of RN

## 2015-05-24 NOTE — Telephone Encounter (Signed)
Informed patient of results and verbal understanding expressed.   Instructed patient to INCREASE TOPROL to 50 mg daily. FU scheduled for 12/23. Patient agrees with treatment plan.

## 2015-05-24 NOTE — Telephone Encounter (Signed)
-----   Message from Sueanne Margarita, MD sent at 05/24/2015  3:37 PM EST ----- Still having episodes of PAF despite increased dose of Toprol.  Increase Toprol to 50mg  daily.  Followup with PA in 4 weeks

## 2015-05-29 ENCOUNTER — Other Ambulatory Visit: Payer: Self-pay | Admitting: Physician Assistant

## 2015-05-31 ENCOUNTER — Other Ambulatory Visit: Payer: Self-pay | Admitting: Physician Assistant

## 2015-05-31 NOTE — Telephone Encounter (Signed)
Meds ordered this encounter  Medications  . diazepam (VALIUM) 10 MG tablet    Sig: TAKE 1 TABLET BY MOUTH EVERY 8 HOURS AS NEEDED    Dispense:  30 tablet    Refill:  0    Not to exceed 5 additional fills before 11/07/2015.

## 2015-06-01 NOTE — Telephone Encounter (Signed)
Called in.

## 2015-06-18 ENCOUNTER — Ambulatory Visit: Payer: 59 | Admitting: Physician Assistant

## 2015-06-21 ENCOUNTER — Other Ambulatory Visit: Payer: Self-pay | Admitting: Physician Assistant

## 2015-06-22 ENCOUNTER — Other Ambulatory Visit: Payer: Self-pay | Admitting: Physician Assistant

## 2015-06-22 NOTE — Telephone Encounter (Signed)
Rx printed at 104. Will bring to 102 after clinic.  Meds ordered this encounter  Medications  . diazepam (VALIUM) 10 MG tablet    Sig: TAKE 1 TABLET BY MOUTH EVERY 8 HOURS AS NEEDED    Dispense:  30 tablet    Refill:  0    Not to exceed 4 additional fills before 11/28/2015.

## 2015-06-23 NOTE — Telephone Encounter (Signed)
Faxed

## 2015-07-01 ENCOUNTER — Ambulatory Visit: Payer: 59 | Admitting: Cardiology

## 2015-07-09 ENCOUNTER — Encounter: Payer: Self-pay | Admitting: Cardiology

## 2015-07-09 ENCOUNTER — Ambulatory Visit (INDEPENDENT_AMBULATORY_CARE_PROVIDER_SITE_OTHER): Payer: 59 | Admitting: Cardiology

## 2015-07-09 ENCOUNTER — Telehealth: Payer: Self-pay | Admitting: Family Medicine

## 2015-07-09 VITALS — BP 128/80 | HR 60 | Ht 68.0 in | Wt 256.0 lb

## 2015-07-09 DIAGNOSIS — J40 Bronchitis, not specified as acute or chronic: Secondary | ICD-10-CM

## 2015-07-09 DIAGNOSIS — E669 Obesity, unspecified: Secondary | ICD-10-CM | POA: Diagnosis not present

## 2015-07-09 DIAGNOSIS — R002 Palpitations: Secondary | ICD-10-CM

## 2015-07-09 DIAGNOSIS — I480 Paroxysmal atrial fibrillation: Secondary | ICD-10-CM

## 2015-07-09 MED ORDER — CEPHALEXIN 500 MG PO CAPS: 500.0000 mg | ORAL_CAPSULE | Freq: Two times a day (BID) | ORAL | Status: DC

## 2015-07-09 MED ORDER — METOPROLOL SUCCINATE ER 25 MG PO TB24: 75.0000 mg | ORAL_TABLET | Freq: Every day | ORAL | Status: DC

## 2015-07-09 MED ORDER — GUAIFENESIN ER 600 MG PO TB12: 600.0000 mg | ORAL_TABLET | Freq: Two times a day (BID) | ORAL | Status: DC

## 2015-07-09 MED ORDER — BENZONATATE 100 MG PO CAPS: 100.0000 mg | ORAL_CAPSULE | Freq: Three times a day (TID) | ORAL | Status: DC | PRN

## 2015-07-09 NOTE — Telephone Encounter (Signed)
lmom to call and reschedule appt that he had on 09-14-15 with chelle

## 2015-07-09 NOTE — Patient Instructions (Signed)
Medication Instructions:  Your physician has recommended you make the following change in your medication:  1.  INCREASE the Metoprolol to 25 mg taking 3 tablets a day 2.  START the Muccinex 600 mg taking 1 tablet twice a day X 7 days 3.  START the Keftlex 500 mg taking 1 tablet twice a day X 7 days 4.  START the Tessalon Perls 100 mg taking 1 tablet every 8 hours as needed for cough  Labwork: None ordered  Testing/Procedures: None ordered  Follow-Up: Your physician recommends that you schedule a follow-up appointment in: [place], NP   Any Other Special Instructions Will Be Listed Below (If Applicable).     If you need a refill on your cardiac medications before your next appointment, please call your pharmacy.

## 2015-07-09 NOTE — Progress Notes (Signed)
Cardiology Office Note   Date:  07/09/2015   ID:  Craig Roberson, DOB 1955/11/25, MRN [id number]  PCP:  JEFFERY,CHELLE, PA-C  Cardiologist:  Dr. [name]    Chief Complaint  Patient presents with  . Atrial Fibrillation      History of Present Illness: Craig Roberson is a 59 y.o. male who presents for PAF.   He has a history of palpitations recently had an ETT done. He was in NSR on arrival and during the ETT he went into atrial fibrillation with RVR. He was completely asymptomatic. He was given Cardizem IV 10mg  and then Metoprolol 2.5mg  IV with conversion to NSR.   He was seen by Dr. [name] 04/2015 and with  CHADS2VASC score is 1 (DM) so no long term anticoagulation indicated at this time. On event monitor he was having breakthrough PAF and toprol was increased to 37.5 daily.  Recent ETT was normal.  Sleep study was recommended.  By end of event monitor his toprol was increased to 50 mg.  He is back today for follow up.  Today he has 2 issues continued PAF occurring 1-2 a day about every other day.  He is doing well on the 50 of toprol.  He also has a cold with cough, productive with yellow to brown mucus and wheezes. No fever.    Past Medical History  Diagnosis Date  . Arthritis     bilateral thumbs  . Allergy   . Cardiac murmur 1996    normal ECHO  . Hyperlipidemia   . ED (erectile dysfunction) 2006  . H/O ETOH abuse     Quit 2006  . Tinnitus of both ears   . BCC (basal cell carcinoma of skin) 04/2006    LEFT low back  . Ulcer   . Macular degeneration   . Diverticulosis   . Hernia     RIGHT inguinal  . PAF (paroxysmal atrial fibrillation) ([place]) 04/15/2015    During exercise myoview    Past Surgical History  Procedure Laterality Date  . Ganglion cyst excision  1970's  . Thumb fusion      BILATERAL  . Lumbar fusion      L3-4; Dr. Ellene Route  . Vasectomy  1976  . Spine surgery       Current Outpatient Prescriptions  Medication Sig Dispense  Refill  . azelastine (ASTELIN) 0.1 % nasal spray Place 1 spray into both nostrils 2 (two) times daily as needed for rhinitis. Use in each nostril as directed    . cholecalciferol (VITAMIN D) 1000 UNITS tablet Take 1,000 Units by mouth daily.    . diazepam (VALIUM) 10 MG tablet Take 10 mg by mouth every 6 (six) hours as needed for anxiety (ringing in ears).    . ezetimibe-simvastatin (VYTORIN) 10-40 MG tablet Take 1 tablet by mouth at bedtime. 90 tablet 1  . ibuprofen (ADVIL,MOTRIN) 800 MG tablet Take 800 mg by mouth every 8 (eight) hours as needed (PAIN).    Marland Kitchen metoprolol succinate (TOPROL-XL) 50 MG 24 hr tablet Take 1 tablet (50 mg total) by mouth daily. 30 tablet 11  . Omega-3 Fatty Acids (FISH OIL) 1000 MG CAPS Take 1,000 mg by mouth daily.     No current facility-administered medications for this visit.    Allergies:   Codeine; Hydrocodone-acetaminophen; Methocarbamol; Morphine and related; and Nsaids    Social History:  The patient  reports that he has quit smoking. He has quit using smokeless tobacco. His smokeless tobacco use  included Chew. He reports that he does not drink alcohol or use illicit drugs.   Family History:  The patient's family history includes Atrial fibrillation in his brother; Cancer in his father; Heart attack in his father; Heart disease (age of onset: 40) in his father; Heart disease (age of onset: 45) in his brother; Hyperlipidemia in his mother; Hypertension in his brother; Stroke in his father; Stroke (age of onset: 107) in his son; Vascular Disease in his brother.    ROS:  General:+ colds no fevers, no weight changes Skin:no rashes or ulcers HEENT:no blurred vision, + congestion CV:see HPI PUL:see HPI GI:no diarrhea constipation or melena, no indigestion GU:no hematuria, no dysuria MS:no joint pain, no claudication Neuro:no syncope, no lightheadedness Endo:pre- diabetes trying to loose wt. , no thyroid disease  Wt Readings from Last 3 Encounters:    07/09/15 256 lb (116.121 kg)  05/19/15 250 lb (113.399 kg)  05/10/15 251 lb 6.4 oz (114.034 kg)     PHYSICAL EXAM: VS:  BP 128/80 mmHg  Pulse 60  Ht 5\' 8"  (1.727 m)  Wt 256 lb (116.121 kg)  BMI 38.93 kg/m2 , BMI Body mass index is 38.93 kg/(m^2). General:Pleasant affect, NAD Skin:Warm and dry, brisk capillary refill HEENT:normocephalic, sclera clear, mucus membranes moist Neck:supple, no JVD, no bruits  Heart:S1S2 RRR without murmur, gallup, rub or click Lungs: without rales, +rhonchi, and wheezes [id number], non tender, + BS, do not palpate liver spleen or masses Ext:no lower ext edema, 2+ pedal pulses, 2+ radial pulses Neuro:alert and oriented X 3, MAE, follows commands, + facial symmetry    EKG:  EKG is NOT ordered today.    Recent Labs: 03/18/2015: ALT 20; BUN 13; Creat 0.89; Potassium 4.3; Sodium 138    Lipid Panel    Component Value Date/Time   CHOL 178 03/18/2015 0900   TRIG 172* 03/18/2015 0900   HDL 37* 03/18/2015 0900   CHOLHDL 4.8 03/18/2015 0900   VLDL 34* 03/18/2015 0900   LDLCALC 107 03/18/2015 0900       Other studies Reviewed: Additional studies/ records that were reviewed today include: reviewed echo and ett..   ASSESSMENT AND PLAN:  1.  PAF now on toprol XL 50 mg.contiues with episodes 6-7 episodes per week lasting 5-15 min.  Will increase toprol to 75 mg.  Will discuss with Dr. [name] with further plan if continued  PAF he is symptomatic with the PAF. Will see back in 3-4 weeks for follow up.   2. Bronchitis add mucinex Keflex 500 mg BID for week and tessalon pearles for cough prn.  3.  Obesity sleep study pending- he palns to begin exercise and continued to monitor diet he has cut out sodas.  4.  Recent hx neg ETT study    Current medicines are reviewed with the patient today.  The patient Has no concerns regarding medicines.  The following changes have been made:  See above Labs/ tests ordered today include:see above  Disposition:    FU:  see above  Lennie Muckle, NP  07/09/2015 10:54 AM    [facility] [address] [place], [place] Phone: [phone]; Fax: [phone]

## 2015-07-10 ENCOUNTER — Other Ambulatory Visit: Payer: Self-pay | Admitting: Physician Assistant

## 2015-07-13 ENCOUNTER — Telehealth: Payer: Self-pay | Admitting: *Deleted

## 2015-07-13 ENCOUNTER — Other Ambulatory Visit: Payer: Self-pay

## 2015-07-13 MED ORDER — DIAZEPAM 10 MG PO TABS
10.0000 mg | ORAL_TABLET | Freq: Four times a day (QID) | ORAL | Status: DC | PRN
Start: 2015-07-13 — End: 2015-08-06

## 2015-07-13 NOTE — Telephone Encounter (Signed)
Rx printed at 104. Will bring to 102 after clinic.  Meds ordered this encounter  Medications  . diazepam (VALIUM) 10 MG tablet    Sig: Take 1 tablet (10 mg total) by mouth every 6 (six) hours as needed for anxiety (ringing in ears).    Dispense:  30 tablet    Refill:  0

## 2015-07-13 NOTE — Telephone Encounter (Signed)
Pt called and LM to req RF of diazepam. It looks like Almyra Free had denied req from pharm on 1/12 stating that it was too early. Actually, with sig reading Q8hrs, however, it is not too early. Pending for Chelle's review.

## 2015-07-13 NOTE — Telephone Encounter (Signed)
Per [name], NP, she wanted me to call the pt to let him know that she has spoken with Dr. [name] re: his o/v with her last week concerning PAF and weakness.  Mickel Baas wanted to start pt on Flecainide bid and to order a ETT in 2 weeks.  When I spoke with pt, he informed me that since Mickel Baas increased his medication, he has not had anymore Afib and he felt fine.  He didn't want to start any more medication unless he had to and he wanted to hold off the ETT.  I spoke with Mickel Baas and she said that was fine.  We would see how he was on his f/u appt and that we have a plan if we needed it.  Pt verbalized agreement and appreciation for the call.

## 2015-07-13 NOTE — Telephone Encounter (Signed)
I called RF into pharm since pt was needing it, and called pt to notify.

## 2015-07-21 ENCOUNTER — Ambulatory Visit (HOSPITAL_BASED_OUTPATIENT_CLINIC_OR_DEPARTMENT_OTHER): Payer: 59 | Attending: Cardiology | Admitting: Radiology

## 2015-07-21 VITALS — Ht 72.0 in | Wt 250.0 lb

## 2015-07-21 DIAGNOSIS — I480 Paroxysmal atrial fibrillation: Secondary | ICD-10-CM | POA: Diagnosis not present

## 2015-07-21 DIAGNOSIS — G4719 Other hypersomnia: Secondary | ICD-10-CM | POA: Insufficient documentation

## 2015-07-21 DIAGNOSIS — Z79899 Other long term (current) drug therapy: Secondary | ICD-10-CM | POA: Diagnosis not present

## 2015-07-21 DIAGNOSIS — E669 Obesity, unspecified: Secondary | ICD-10-CM | POA: Diagnosis not present

## 2015-07-21 DIAGNOSIS — G4733 Obstructive sleep apnea (adult) (pediatric): Secondary | ICD-10-CM | POA: Insufficient documentation

## 2015-07-21 DIAGNOSIS — Z6834 Body mass index (BMI) 34.0-34.9, adult: Secondary | ICD-10-CM | POA: Diagnosis not present

## 2015-07-21 DIAGNOSIS — R0683 Snoring: Secondary | ICD-10-CM | POA: Insufficient documentation

## 2015-07-27 ENCOUNTER — Telehealth: Payer: Self-pay | Admitting: Cardiology

## 2015-07-27 DIAGNOSIS — G4733 Obstructive sleep apnea (adult) (pediatric): Secondary | ICD-10-CM | POA: Insufficient documentation

## 2015-07-27 NOTE — Telephone Encounter (Signed)
Please let patient know that they have mild sleep apnea.   Please set up OV at next available to discuss treatment options. 

## 2015-07-27 NOTE — Sleep Study (Signed)
   Patient Name: Craig Roberson, Craig Roberson MRN: [id number] Study Date: 07/21/2015 Gender: Male D.O.B: Mar 27, 1956 Age (years): 76 Referring Provider: Fransico Him MD, ABSM Interpreting Physician: Fransico Him MD, ABSM RPSGT: Laren Everts  Height (inches): 72 BMI: 34 Weight (lbs): 250 Neck Size: 17.50  CLINICAL INFORMATION Sleep Study Type: NPSG Indication for sleep study: Excessive Daytime Sleepiness, Obesity, Snoring Epworth Sleepiness Score: 7  SLEEP STUDY TECHNIQUE As per the AASM Manual for the Scoring of Sleep and Associated Events v2.3 (April 2016) with a hypopnea requiring 4% desaturations. The channels recorded and monitored were frontal, central and occipital EEG, electrooculogram (EOG), submentalis EMG (chin), nasal and oral airflow, thoracic and abdominal wall motion, anterior tibialis EMG, snore microphone, electrocardiogram, and pulse oximetry.  MEDICATIONS Patient's medications include: Astelin, benzonatate, Keflex, Vit D, Valium, Vytorin, Mucinex, Advil, Toprol, Fish Oil. Medications self-administered by patient during sleep study : No sleep medicine administered.  SLEEP ARCHITECTURE The study was initiated at 10:41:18 PM and ended at 5:14:55 AM. Sleep onset time was 10.5 minutes and the sleep efficiency was reduced at 79.9%. The total sleep time was 314.5 minutes. Stage REM latency was 73.0 minutes. The patient spent 13.04% of the night in stage N1 sleep, 60.57% in stage N2 sleep, 0.00% in stage N3 and 26.39% in REM. Alpha intrusion was absent. Supine sleep was 52.31%.  RESPIRATORY PARAMETERS The overall apnea/hypopnea index (AHI) was 6.3 per hour. There were 1 total apneas, including 1 obstructive, 0 central and 0 mixed apneas. There were 32 hypopneas and 16 RERAs. The AHI during Stage REM sleep was 21.0 per hour. AHI while supine was 12.0 per hour. The mean oxygen saturation was 92.31%. The minimum SpO2 during sleep was 86.00%. Soft snoring was noted during  this study.  CARDIAC DATA The 2 lead EKG demonstrated sinus rhythm. The mean heart rate was 56.19 beats per minute. Other EKG findings include: None.  LEG MOVEMENT DATA The total PLMS were 0 with a resulting PLMS index of 0.00. Associated arousal with leg movement index was 0.0 .  IMPRESSIONS - Mild obstructive sleep apnea occurred during this study (AHI = 6.3/h). - No significant central sleep apnea occurred during this study (CAI = 0.0/h). - Mild oxygen desaturation was noted during this study (Min O2 = 86.00%). - The patient snored with Soft snoring volume. - No cardiac abnormalities were noted during this study. - Clinically significant periodic limb movements did not occur during sleep. No significant associated arousals.  DIAGNOSIS - Obstructive Sleep Apnea (327.23 [G47.33 ICD-10])  RECOMMENDATIONS - Positional therapy avoiding supine position during sleep. - Very mild obstructive sleep apnea. Return to discuss treatment options. - Avoid alcohol, sedatives and other CNS depressants that may worsen sleep apnea and disrupt normal sleep architecture. - Sleep hygiene should be reviewed to assess factors that may improve sleep quality. - Weight management and regular exercise should be initiated or continued if appropriate.  Sueanne Margarita Diplomate, American Board of Sleep Medicine  ELECTRONICALLY SIGNED ON:  07/27/2015, 8:31 PM [place] PH: [phone]   FX: [phone] [place]

## 2015-08-02 NOTE — Telephone Encounter (Signed)
Patient informed of results. Stated verbal understanding. Appointment to discuss OSA Treatment was made for 10/01/15 8:45am

## 2015-08-06 ENCOUNTER — Other Ambulatory Visit: Payer: Self-pay | Admitting: Physician Assistant

## 2015-08-09 ENCOUNTER — Other Ambulatory Visit: Payer: Self-pay | Admitting: Physician Assistant

## 2015-08-09 NOTE — Telephone Encounter (Signed)
Meds ordered this encounter  Medications  . diazepam (VALIUM) 10 MG tablet    Sig: TAKE 1 TABLET EVERY 6 HOURS AS NEEDED FOR ANXIETY (SHOULD QUIT RINGING IN EARS)    Dispense:  30 tablet    Refill:  0    Not to exceed 4 additional fills before 01/09/2016.

## 2015-08-09 NOTE — Telephone Encounter (Signed)
Faxed. Pt aware

## 2015-08-11 ENCOUNTER — Ambulatory Visit: Payer: 59 | Admitting: Cardiology

## 2015-08-23 ENCOUNTER — Ambulatory Visit (INDEPENDENT_AMBULATORY_CARE_PROVIDER_SITE_OTHER): Payer: 59 | Admitting: Physician Assistant

## 2015-08-23 ENCOUNTER — Encounter: Payer: Self-pay | Admitting: Physician Assistant

## 2015-08-23 VITALS — BP 120/80 | HR 80 | Ht 70.0 in | Wt 260.6 lb

## 2015-08-23 DIAGNOSIS — E669 Obesity, unspecified: Secondary | ICD-10-CM | POA: Diagnosis not present

## 2015-08-23 DIAGNOSIS — G4733 Obstructive sleep apnea (adult) (pediatric): Secondary | ICD-10-CM | POA: Diagnosis not present

## 2015-08-23 DIAGNOSIS — I480 Paroxysmal atrial fibrillation: Secondary | ICD-10-CM | POA: Diagnosis not present

## 2015-08-23 DIAGNOSIS — E782 Mixed hyperlipidemia: Secondary | ICD-10-CM

## 2015-08-23 LAB — TSH: TSH: 1.75 mIU/L (ref 0.40–4.50)

## 2015-08-23 NOTE — Progress Notes (Signed)
Patient ID: Craig Roberson, male   DOB: 1955/11/01, 60 y.o.   MRN: [id number]     Date:  08/23/2015   ID:  Craig Roberson, DOB [birth year], MRN [id number]  PCP:  JEFFERY,CHELLE, PA-C  Primary Cardiologist:  [name]  Chief complaint: Follow-up atrial fibrillation   History of Present Illness: Craig Roberson is a 60 y.o. male  who presents for follow up PAF.   He has a history of palpitations recently had an ETT done. He was in NSR on arrival and during the ETT he went into atrial fibrillation with RVR. He was completely asymptomatic. He was given Cardizem IV 10mg  and then Metoprolol 2.5mg  IV with conversion to NSR.   He was seen by Dr. [name] 04/2015 and with CHADS2VASC score is 1 (DM) so no long term anticoagulation indicated at this time. On event monitor he was having breakthrough PAF and toprol was increased to 37.5 daily. Recent ETT was normal. Sleep study was recommended. By end of event monitor his toprol was increased to 50 mg. He is back today for follow up.  Visit with [name] 07/09/15:   He has 2 issues continued PAF occurring 1-2 a day about every other day. He is doing well on the 50 of toprol. He also has a cold with cough, productive with yellow to brown mucus and wheezes. No fever.   He presents today for follow-up of his atrial fibrillation. Reports doing well.  He does talk about unexplained weight gain, leg swelling, and is still having some irregular heart beating, which last about 5-10 minutes. He says the frequency has decreased since the increased metoprolol.  He is drinking 4-6 regular Pepsi's per day.  The patient currently denies nausea, vomiting, fever, chest pain, shortness of breath, orthopnea, dizziness, PND, cough, congestion, abdominal pain, hematochezia, melena, claudication.  Wt Readings from Last 3 Encounters:  08/23/15 260 lb 9.6 oz (118.207 kg)  07/21/15 250 lb (113.399 kg)  07/09/15 256 lb (116.121 kg)     Past Medical  History  Diagnosis Date  . Arthritis     bilateral thumbs  . Allergy   . Cardiac murmur 1996    normal ECHO  . Hyperlipidemia   . ED (erectile dysfunction) 2006  . H/O ETOH abuse     Quit 2006  . Tinnitus of both ears   . BCC (basal cell carcinoma of skin) 04/2006    LEFT low back  . Ulcer   . Macular degeneration   . Diverticulosis   . Hernia     RIGHT inguinal  . PAF (paroxysmal atrial fibrillation) ([place]) 04/15/2015    During exercise myoview    Current Outpatient Prescriptions  Medication Sig Dispense Refill  . azelastine (ASTELIN) 0.1 % nasal spray Place 1 spray into both nostrils 2 (two) times daily as needed for rhinitis. Use in each nostril as directed    . cholecalciferol (VITAMIN D) 1000 UNITS tablet Take 1,000 Units by mouth daily.    . diazepam (VALIUM) 10 MG tablet TAKE 1 TABLET EVERY 6 HOURS AS NEEDED FOR ANXIETY (SHOULD QUIT RINGING IN EARS) 30 tablet 0  . ezetimibe-simvastatin (VYTORIN) 10-40 MG tablet Take 1 tablet by mouth at bedtime. 90 tablet 1  . guaiFENesin (MUCINEX) 600 MG 12 hr tablet Take 600 mg by mouth 2 (two) times daily as needed for cough or to loosen phlegm.    Marland Kitchen ibuprofen (ADVIL,MOTRIN) 800 MG tablet Take 800 mg by mouth every 8 (eight) hours as needed (PAIN).    Marland Kitchen  metoprolol succinate (TOPROL-XL) 25 MG 24 hr tablet Take 3 tablets (75 mg total) by mouth daily. Take with or immediately following a meal. 270 tablet 3  . Omega-3 Fatty Acids (FISH OIL) 1000 MG CAPS Take 12,000 mg by mouth daily.     . Omega-3 Fatty Acids (FISH OIL) 1200 MG CAPS Take 1,200 mg by mouth daily.     No current facility-administered medications for this visit.    Allergies:    Allergies  Allergen Reactions  . Codeine Other (See Comments)    Dizziness; faint  . Hydrocodone-Acetaminophen   . Methocarbamol Other (See Comments)    HA, tinnitus  . Morphine And Related Other (See Comments)    Severe Headache  . Nsaids     BRBPR    Social History:  The patient   reports that he has quit smoking. He has quit using smokeless tobacco. His smokeless tobacco use included Chew. He reports that he does not drink alcohol or use illicit drugs.   Family history:   Family History  Problem Relation Age of Onset  . Heart disease Brother 41    AMI, normal weight, normal cholesterol  . Heart disease Father 93  . Cancer Father     Prostate  . Stroke Father   . Stroke Son 65  . Hyperlipidemia Mother   . Atrial fibrillation Brother   . Vascular Disease Brother     carotid artery disease  . Heart attack Father   . Hypertension Brother     ROS:  Please see the history of present illness.  All other systems reviewed and negative.   PHYSICAL EXAM: VS:  BP 120/80 mmHg  Pulse 80  Ht 5\' 10"  (1.778 m)  Wt 260 lb 9.6 oz (118.207 kg)  BMI 37.39 kg/m2 Obese, well developed, in no acute distress HEENT: Pupils are equal round react to light accommodation extraocular movements are intact.  Neck: no JVDNo cervical lymphadenopathy. Cardiac: Regular rate and rhythm without murmurs rubs or gallops. Lungs:  clear to auscultation bilaterally, no wheezing, rhonchi or rales Abd: soft, nontender, positive bowel sounds all quadrants, no hepatosplenomegaly Ext: Trace bilateral lower extremity edema.  2+ radial and dorsalis pedis pulses. Skin: warm and dry Neuro:  Grossly normal    ASSESSMENT AND PLAN:  Problem List Items Addressed This Visit    PAF (paroxysmal atrial fibrillation) ([place]) - Primary   Relevant Orders   TSH   OSA (obstructive sleep apnea)   Obesity (BMI 30-39.9)   Mixed hyperlipidemia (Chronic)     [name] was diagnosed with mild sleep apnea. He is due to follow-up with Dr. [name] in April regarding treatment. His frequency of atrial fibrillation seems of decreased with now being on metoprolol 75 mg daily.  I suspect all the caffeine he is getting from drinking 4-5 Pepsi's a day is also contributing to the excitability of his myocardium, not to  mention all the weight gain that he's been having. I recommended he stop drinking soda altogether and start exercising at the gym every day. Recommended, and gave him the contact information, for a registered dietitian.   We'll check a TSH since it has not been done in the last couple years. Follow-up as scheduled.

## 2015-08-23 NOTE — Patient Instructions (Signed)
Medication Instructions:  Your physician recommends that you continue on your current medications as directed. Please refer to the Current Medication list given to you today.   Labwork: Tsh today  Testing/Procedures: None ordered  Follow-Up: Follow up as planned with Dr.Turner in April 2017  Any Other Special Instructions Will Be Listed Below (If Applicable). Stop drinking sodas.     If you need a refill on your cardiac medications before your next appointment, please call your pharmacy.

## 2015-09-10 ENCOUNTER — Telehealth: Payer: Self-pay | Admitting: Cardiology

## 2015-09-10 NOTE — Telephone Encounter (Signed)
Informed patient of results and verbal understanding expressed.  

## 2015-09-10 NOTE — Telephone Encounter (Signed)
New message      Calling to get results of thyroid test from months ago.  He said he never got the results

## 2015-09-14 ENCOUNTER — Ambulatory Visit: Payer: 59 | Admitting: Physician Assistant

## 2015-09-28 ENCOUNTER — Ambulatory Visit: Payer: 59 | Admitting: Physician Assistant

## 2015-10-01 ENCOUNTER — Ambulatory Visit (INDEPENDENT_AMBULATORY_CARE_PROVIDER_SITE_OTHER): Payer: 59 | Admitting: Cardiology

## 2015-10-01 ENCOUNTER — Encounter: Payer: Self-pay | Admitting: Cardiology

## 2015-10-01 VITALS — BP 124/74 | HR 64 | Ht 72.0 in | Wt 261.0 lb

## 2015-10-01 DIAGNOSIS — E669 Obesity, unspecified: Secondary | ICD-10-CM

## 2015-10-01 DIAGNOSIS — G4733 Obstructive sleep apnea (adult) (pediatric): Secondary | ICD-10-CM | POA: Diagnosis not present

## 2015-10-01 DIAGNOSIS — I480 Paroxysmal atrial fibrillation: Secondary | ICD-10-CM

## 2015-10-01 MED ORDER — METOPROLOL SUCCINATE ER 25 MG PO TB24: ORAL_TABLET | ORAL | Status: DC

## 2015-10-01 NOTE — Progress Notes (Signed)
Cardiology Office Note    Date:  10/01/2015   ID:  Craig Roberson, DOB [birth year], MRN [id number]  PCP:  JEFFERY,CHELLE, PA-C  Cardiologist:  Craig Margarita, MD   Chief Complaint  Patient presents with  . Atrial Fibrillation  . Sleep Apnea    History of Present Illness:  Craig Roberson is a 60 y.o. male who presents for followup of atrial fibrillation and recent sleep study.  He denies any chest pain, SOB, DOE, LE edema, dizziness, claudication or syncope.  He says that he still has some intermittent palpitations.    Due to complaints of snoring at night and daytime sleepiness a PSG was done which showed mild OSA with an AHI of 6.3/hr with oxygen desaturations as low as 86%.  He is now here to discuss treatment options.       Past Medical History  Diagnosis Date  . Arthritis     bilateral thumbs  . Allergy   . Cardiac murmur 1996    normal ECHO  . Hyperlipidemia   . ED (erectile dysfunction) 2006  . H/O ETOH abuse     Quit 2006  . Tinnitus of both ears   . BCC (basal cell carcinoma of skin) 04/2006    LEFT low back  . Ulcer   . Macular degeneration   . Diverticulosis   . Hernia     RIGHT inguinal  . PAF (paroxysmal atrial fibrillation) ([place]) 04/15/2015    During exercise myoview    Past Surgical History  Procedure Laterality Date  . Ganglion cyst excision  1970's  . Thumb fusion      BILATERAL  . Lumbar fusion      L3-4; Dr. Ellene Route  . Vasectomy  1976  . Spine surgery      Current Medications: Outpatient Prescriptions Prior to Visit  Medication Sig Dispense Refill  . azelastine (ASTELIN) 0.1 % nasal spray Place 1 spray into both nostrils 2 (two) times daily as needed for rhinitis. Use in each nostril as directed    . cholecalciferol (VITAMIN D) 1000 UNITS tablet Take 1,000 Units by mouth daily.    . diazepam (VALIUM) 10 MG tablet TAKE 1 TABLET EVERY 6 HOURS AS NEEDED FOR ANXIETY (SHOULD QUIT RINGING IN EARS) 30 tablet 0  . ezetimibe-simvastatin  (VYTORIN) 10-40 MG tablet Take 1 tablet by mouth at bedtime. 90 tablet 1  . guaiFENesin (MUCINEX) 600 MG 12 hr tablet Take 600 mg by mouth 2 (two) times daily as needed for cough or to loosen phlegm.    Marland Kitchen ibuprofen (ADVIL,MOTRIN) 800 MG tablet Take 800 mg by mouth every 8 (eight) hours as needed (PAIN).    Marland Kitchen metoprolol succinate (TOPROL-XL) 25 MG 24 hr tablet Take 3 tablets (75 mg total) by mouth daily. Take with or immediately following a meal. 270 tablet 3  . Omega-3 Fatty Acids (FISH OIL) 1000 MG CAPS Take 12,000 mg by mouth daily.     . Omega-3 Fatty Acids (FISH OIL) 1200 MG CAPS Take 1,200 mg by mouth daily.     No facility-administered medications prior to visit.     Allergies:   Codeine; Hydrocodone-acetaminophen; Methocarbamol; Morphine and related; and Nsaids   Social History   Social History  . Marital Status: Married    Spouse Name: [name]  . Number of Children: 2  . Years of Education: 8   Occupational History  . Retired     Careers information officer   Social History Main Topics  . Smoking  status: Former Research scientist (life sciences)  . Smokeless tobacco: Former Systems developer    Types: Chew     Comment: some days  . Alcohol Use: No  . Drug Use: No  . Sexual Activity:    Partners: Female     Comment: family stress; his back pain have reduced the frequency   Other Topics Concern  . None   Social History Narrative   Married to 3rd wife.     Family History:  The patient's family history includes Atrial fibrillation in his brother; Cancer in his father; Heart attack in his father; Heart disease (age of onset: 69) in his father; Heart disease (age of onset: 56) in his brother; Hyperlipidemia in his mother; Hypertension in his brother; Stroke in his father; Stroke (age of onset: 37) in his son; Vascular Disease in his brother.   ROS:   Please see the history of present illness.    ROS All other systems reviewed and are negative.   PHYSICAL EXAM:   VS:  BP 124/74 mmHg  Pulse 64  Ht 6' (1.829  m)  Wt 261 lb (118.389 kg)  BMI 35.39 kg/m2   GEN: Well nourished, well developed, in no acute distress HEENT: normal Neck: no JVD, carotid bruits, or masses Cardiac: RRR; no murmurs, rubs, or gallops.  Trace edema.  Intact distal pulses bilaterally.  Respiratory:  clear to auscultation bilaterally, normal work of breathing GI: soft, nontender, nondistended, + BS MS: no deformity or atrophy Skin: warm and dry, no rash Neuro:  Alert and Oriented x 3, Strength and sensation are intact Psych: euthymic mood, full affect  Wt Readings from Last 3 Encounters:  10/01/15 261 lb (118.389 kg)  08/23/15 260 lb 9.6 oz (118.207 kg)  07/21/15 250 lb (113.399 kg)      Studies/Labs Reviewed:   EKG:  EKG was not ordered today.    Recent Labs: 03/18/2015: ALT 20; BUN 13; Creat 0.89; Potassium 4.3; Sodium 138 08/23/2015: TSH 1.75   Lipid Panel    Component Value Date/Time   CHOL 178 03/18/2015 0900   TRIG 172* 03/18/2015 0900   HDL 37* 03/18/2015 0900   CHOLHDL 4.8 03/18/2015 0900   VLDL 34* 03/18/2015 0900   LDLCALC 107 03/18/2015 0900    Additional studies/ records that were reviewed today include:  PSG study    ASSESSMENT:    1. OSA (obstructive sleep apnea)   2. PAF (paroxysmal atrial fibrillation) (HCC)   3. Obesity (BMI 30-39.9)      PLAN:  In order of problems listed above:  1. OSA which is very mild.   Given how mild the apnea is, I have recommended that we proceed with referral to Dr. Toy Cookey to be evaluated for an oral device.   2. PAF maintaining NSR on BB.  He is still having some palpitations so I will increase his Toprol to 75mg  qam and 25mg  qpm.  His CHADS2VASC score is 0 so not on longterm anticoagulation.  I have also recommended that he cut out all caffeine.  He does not drink ETOH.   3. Obesity- I have encouraged him to increase his aerobic exercise and cut back on portions and carbs.   Followup with me in 1 year and PA in 4 weeks.      Medication  Adjustments/Labs and Tests Ordered: Current medicines are reviewed at length with the patient today.  Concerns regarding medicines are outlined above.  Medication changes, Labs and Tests ordered today are listed in the Patient Instructions below.  There are no Patient Instructions on file for this visit.   Craig Nida, MD  10/01/2015 8:58 AM    [facility] [address], [place], [place]  [address] Phone: [phone]; Fax: [phone]

## 2015-10-01 NOTE — Patient Instructions (Addendum)
Medication Instructions:  1) INCREASE your Toprol to 75mg  (3 tablets) in the morning and 25mg  (1 tablet) in the evening.  Labwork: None  Testing/Procedures: None  Follow-Up: Your physician recommends that you schedule a follow-up appointment in: 4 weeks with a PA/NP.  Your physician wants you to follow-up in: 1 year with Dr. [name]. You will receive a reminder letter in the mail two months in advance. If you don't receive a letter, please call our office to schedule the follow-up appointment.    Any Other Special Instructions Will Be Listed Below (If Applicable).  Dr. Corky Sing office will call you to set you up for an oral device.   If you need a refill on your cardiac medications before your next appointment, please call your pharmacy.

## 2015-10-05 ENCOUNTER — Ambulatory Visit (INDEPENDENT_AMBULATORY_CARE_PROVIDER_SITE_OTHER): Payer: 59 | Admitting: Physician Assistant

## 2015-10-05 ENCOUNTER — Encounter: Payer: Self-pay | Admitting: Physician Assistant

## 2015-10-05 VITALS — BP 117/74 | HR 65 | Temp 98.0°F | Resp 16 | Ht 71.0 in | Wt 253.6 lb

## 2015-10-05 DIAGNOSIS — Z23 Encounter for immunization: Secondary | ICD-10-CM

## 2015-10-05 DIAGNOSIS — E119 Type 2 diabetes mellitus without complications: Secondary | ICD-10-CM | POA: Diagnosis not present

## 2015-10-05 DIAGNOSIS — R0981 Nasal congestion: Secondary | ICD-10-CM | POA: Diagnosis not present

## 2015-10-05 DIAGNOSIS — E782 Mixed hyperlipidemia: Secondary | ICD-10-CM

## 2015-10-05 LAB — LIPID PANEL
CHOLESTEROL: 207 mg/dL — AB (ref 125–200)
HDL: 37 mg/dL — ABNORMAL LOW (ref >=40)
LDL Cholesterol: 140 mg/dL — ABNORMAL HIGH (ref <130)
TRIGLYCERIDES: 152 mg/dL — AB (ref <150)
Total CHOL/HDL Ratio: 5.6 Ratio — ABNORMAL HIGH (ref <=5.0)
VLDL: 30 mg/dL (ref <30)

## 2015-10-05 LAB — COMPREHENSIVE METABOLIC PANEL
ALBUMIN: 4.4 g/dL (ref 3.6–5.1)
ALK PHOS: 87 U/L (ref 40–115)
ALT: 16 U/L (ref 9–46)
AST: 18 U/L (ref 10–35)
BILIRUBIN TOTAL: 0.6 mg/dL (ref 0.2–1.2)
BUN: 16 mg/dL (ref 7–25)
CALCIUM: 9.6 mg/dL (ref 8.6–10.3)
CO2: 23 mmol/L (ref 20–31)
CREATININE: 0.86 mg/dL (ref 0.70–1.25)
Chloride: 102 mmol/L (ref 98–110)
Glucose, Bld: 103 mg/dL — ABNORMAL HIGH (ref 65–99)
Potassium: 4.5 mmol/L (ref 3.5–5.3)
SODIUM: 138 mmol/L (ref 135–146)
TOTAL PROTEIN: 7.5 g/dL (ref 6.1–8.1)

## 2015-10-05 LAB — MICROALBUMIN, URINE: Microalb, Ur: <0.2 mg/dL

## 2015-10-05 LAB — HEMOGLOBIN A1C
Hgb A1c MFr Bld: 6.9 % — ABNORMAL HIGH (ref <5.7)
MEAN PLASMA GLUCOSE: 151 mg/dL

## 2015-10-05 MED ORDER — FLUTICASONE PROPIONATE 50 MCG/ACT NA SUSP: 2.0000 | Freq: Every day | NASAL | Status: DC

## 2015-10-05 NOTE — Progress Notes (Signed)
Subjective:     Patient ID: Craig Roberson, male   DOB: 1956/03/06, 60 y.o.   MRN: [id number]  Chief Complaint  Patient presents with  . Follow-up  . Diabetes  . Medication Refill  . nasal congestion    can't breathe at night when lying down, green/yellow mucus x 4-5 days  . Sore Throat    started this am  . Hyperlipidemia    HPI  Patient presents today for diabetes and hyperlipidemia follow up along with nasal congestion and sore throat.  Patient states that his nasal congestion and sore throat started approx 3-4 days ago. He has yellow-green rhinorrhea, sinus pressure and headache. Denies any fever, otalgia, cough, sob, or cp. Nasal congestion is worse at night. He has tried afrin with little relief. Denies seasonal allergies.  Seen last week  By cardiologist and his Toprol was increased to 75 mg due to continued palpitations. He was also diagnosed with mild OSA and referred to dentistry for evaluation for oral device. No CPAP needed at this time.  He is continuing to work on diet and exercise. Recently bought a treadmill and has started walking. Patient consumes large amount of Pepsi during the day. He knows that he needs to limit his caffeine and sugar intake. Glucose at last visit was 113 and Hgb A1c was 6.4%. He failed to make an appointment with dietician. Would like another referral to help with dieting.  He also complains of continued arthritis pain. Currently taking ibuprofen 800 mg with little relief.  He missed is annual eye exam and needs to reschedule.   Current Outpatient Prescriptions on File Prior to Visit  Medication Sig Dispense Refill  . azelastine (ASTELIN) 0.1 % nasal spray Place 1 spray into both nostrils 2 (two) times daily as needed for rhinitis. Use in each nostril as directed    . cholecalciferol (VITAMIN D) 1000 UNITS tablet Take 1,000 Units by mouth daily.    . diazepam (VALIUM) 10 MG tablet TAKE 1 TABLET EVERY 6 HOURS AS NEEDED FOR ANXIETY (SHOULD  QUIT RINGING IN EARS) 30 tablet 0  . ezetimibe-simvastatin (VYTORIN) 10-40 MG tablet Take 1 tablet by mouth at bedtime. 90 tablet 1  . guaiFENesin (MUCINEX) 600 MG 12 hr tablet Take 600 mg by mouth 2 (two) times daily as needed for cough or to loosen phlegm.    Marland Kitchen ibuprofen (ADVIL,MOTRIN) 800 MG tablet Take 800 mg by mouth every 8 (eight) hours as needed (PAIN).    Marland Kitchen metoprolol succinate (TOPROL-XL) 25 MG 24 hr tablet Take 75mg  (3 tablets) every morning and 25mg  every nightTake with or immediately following a meal. 360 tablet 3  . Omega-3 Fatty Acids (FISH OIL) 1000 MG CAPS Take 12,000 mg by mouth daily.     . Omega-3 Fatty Acids (FISH OIL) 1200 MG CAPS Take 1,200 mg by mouth daily.     No current facility-administered medications on file prior to visit.   Allergies  Allergen Reactions  . Codeine Other (See Comments)    Dizziness; faint  . Hydrocodone-Acetaminophen   . Methocarbamol Other (See Comments)    HA, tinnitus  . Morphine And Related Other (See Comments)    Severe Headache  . Nsaids     BRBPR     Review of Systems  Constitutional: Negative for fever, chills and fatigue.  HENT: Positive for congestion, postnasal drip, rhinorrhea, sinus pressure, sneezing and sore throat. Negative for ear pain.   Eyes: Negative.   Respiratory: Negative for cough and shortness  of breath.   Cardiovascular: Negative for chest pain.  Gastrointestinal: Negative for nausea, vomiting, abdominal pain and diarrhea.  Musculoskeletal: Positive for back pain, joint swelling and arthralgias. Negative for myalgias.  Neurological: Negative for dizziness, syncope, weakness, light-headedness, numbness and headaches.       Objective:   Physical Exam  Constitutional: He is oriented to person, place, and time. He appears well-developed and well-nourished. No distress.  HENT:  Head: Normocephalic and atraumatic.  Right Ear: External ear normal.  Left Ear: External ear normal.  Nose: Rhinorrhea present.  Right sinus exhibits maxillary sinus tenderness and frontal sinus tenderness. Left sinus exhibits maxillary sinus tenderness and frontal sinus tenderness.  Mouth/Throat: Posterior oropharyngeal erythema present. No oropharyngeal exudate or posterior oropharyngeal edema.  Left and right ear canals with moderate amount cerumen, unable to get good view of TM.  Nasal turbinates are atrophy with crusting and dried blood present due to irritation, clear rhinorrhea present.  Cardiovascular: Normal rate, regular rhythm, normal heart sounds and intact distal pulses.   Pulmonary/Chest: Effort normal and breath sounds normal.  Abdominal: Soft. Bowel sounds are normal.  Musculoskeletal: Normal range of motion.  Neurological: He is alert and oriented to person, place, and time.  Skin: Skin is warm and dry.  Psychiatric: He has a normal mood and affect. His behavior is normal. Judgment and thought content normal.       Assessment/Plan:     1. Sinus congestion Most likely viral or allergic in nature since only present for 3-4 days. Patient can try Claritin, allegra, or zyrtec for allergy relief. Also use Flonase as prescribed. May use saline spray to help with nasal dryness and irritation. - fluticasone (FLONASE) 50 MCG/ACT nasal spray; Place 2 sprays into both nostrils daily.  Dispense: 16 g; Refill: 12  2. Mixed hyperlipidemia Awaiting lab results. Continue current medication regimen at this time. - Lipid panel  3. Type 2 diabetes mellitus without complication, without long-term current use of insulin ([place]) Awaiting labs. Will re evaluate for need for medication. Will send for another dietician referral. - HM Diabetes Foot Exam - Comprehensive metabolic panel - Microalbumin, urine - Pneumococcal polysaccharide vaccine 23-valent greater than or equal to 2yo subcutaneous/IM - Hemoglobin A1c - Ambulatory referral to diabetic education  4. Need for pneumococcal vaccination - Pneumococcal  polysaccharide vaccine 23-valent greater than or equal to 2yo subcutaneous/IM  Melina Schools PA-S 10/05/2015

## 2015-10-05 NOTE — Patient Instructions (Addendum)
Can try Claritin, Allegra, or Zyrtec for allergy and sinus relief. Use Flonase spray for allergy relief. Follow up with dietician they will call with appointment.    IF you received an x-ray today, you will receive an invoice from [facility]. Please contact [facility] at [phone] with questions or concerns regarding your invoice.   IF you received labwork today, you will receive an invoice from [employer]. Please contact [facility] at [phone] with questions or concerns regarding your invoice.   Our billing staff will not be able to assist you with questions regarding bills from these companies.  You will be contacted with the lab results as soon as they are available. The fastest way to get your results is to activate your My Chart account. Instructions are located on the last page of this paperwork. If you have not heard from [place] regarding the results in 2 weeks, please contact this office.

## 2015-10-05 NOTE — Progress Notes (Signed)
Patient ID: Craig Roberson, male    DOB: [birth year], 60 y.o.   MRN: [id number]  PCP: York Valliant, PA-C  Subjective:   Chief Complaint  Patient presents with  . Follow-up  . Diabetes  . Medication Refill  . nasal congestion    can't breathe at night when lying down, green/yellow mucus x 4-5 days  . Sore Throat    started this am  . Hyperlipidemia    HPI Presents for evaluation of diabetes and hyperlipidemia follow up along with nasal congestion and sore throat.  Patient states that his nasal congestion and sore throat started approx 3-4 days ago. He has yellow-green rhinorrhea, sinus pressure and headache. Denies any fever, otalgia, cough, sob, or cp. Nasal congestion is worse at night. He has tried afrin with little relief. Denies seasonal allergies.  Seen last [facility] cardiology and his Toprol was increased to 75 mg due to continued palpitations. He was also diagnosed with mild OSA and referred to dentistry for evaluation for oral device. No CPAP needed at this time.  He is continuing to work on diet and exercise. Recently bought a treadmill and has started walking. Patient consumes large amount of Pepsi during the day. He knows that he needs to limit his caffeine and sugar intake. Glucose at last visit was 113 and Hgb A1c was 6.4%. He failed to make an appointment with dietician. Would like another referral to help with dieting.  He also complains of continued arthritis pain. Currently taking ibuprofen 800 mg with little relief.  He missed is annual eye exam and needs to reschedule.    Review of Systems Constitutional: Negative for fever, chills and fatigue.  HENT: Positive for congestion, postnasal drip, rhinorrhea, sinus pressure, sneezing and sore throat. Negative for ear pain.  Eyes: Negative.  Respiratory: Negative for cough and shortness of breath.  Cardiovascular: Negative for chest pain.  Gastrointestinal: Negative for nausea, vomiting, abdominal pain and  diarrhea.  Musculoskeletal: Positive for back pain, joint swelling and arthralgias. Negative for myalgias.  Neurological: Negative for dizziness, syncope, weakness, light-headedness, numbness and headaches.       Patient Active Problem List   Diagnosis Date Noted  . OSA (obstructive sleep apnea) 07/27/2015  . Snoring 05/19/2015  . Excessive daytime sleepiness 05/19/2015  . PAF (paroxysmal atrial fibrillation) ([place]) 04/15/2015  . Family history of premature CAD 04/06/2015  . Heart palpitations 04/06/2015  . Diabetes mellitus type 2, uncomplicated ([place]) [id number]  . BMI 32.0-32.9,adult 09/15/2014  . Degenerative disc disease, lumbar 09/15/2014  . Obesity (BMI 30-39.9) 09/02/2013  . DJD (degenerative joint disease), multiple sites 02/18/2013  . Diverticulosis of sigmoid colon 08/20/2012  . AR (allergic rhinitis) 03/19/2012  . Mixed hyperlipidemia 03/19/2012  . Cardiac murmur   . ED (erectile dysfunction)   . Tinnitus of both ears   . Macular degeneration   . Hernia      Prior to Admission medications   Medication Sig Start Date End Date Taking? Authorizing Provider  azelastine (ASTELIN) 0.1 % nasal spray Place 1 spray into both nostrils 2 (two) times daily as needed for rhinitis. Use in each nostril as directed   Yes Historical Provider, MD  cholecalciferol (VITAMIN D) 1000 UNITS tablet Take 1,000 Units by mouth daily.   Yes Historical Provider, MD  diazepam (VALIUM) 10 MG tablet TAKE 1 TABLET EVERY 6 HOURS AS NEEDED FOR ANXIETY (SHOULD QUIT RINGING IN EARS) 08/09/15  Yes Hillari Zumwalt, PA-C  ezetimibe-simvastatin (VYTORIN) 10-40 MG tablet Take 1 tablet by mouth  at bedtime. 04/09/15  Yes Dandy Lazaro, PA-C  guaiFENesin (MUCINEX) 600 MG 12 hr tablet Take 600 mg by mouth 2 (two) times daily as needed for cough or to loosen phlegm.   Yes Historical Provider, MD  ibuprofen (ADVIL,MOTRIN) 800 MG tablet Take 800 mg by mouth every 8 (eight) hours as needed (PAIN).   Yes Historical  Provider, MD  metoprolol succinate (TOPROL-XL) 25 MG 24 hr tablet Take 75mg  (3 tablets) every morning and 25mg  every nightTake with or immediately following a meal. 10/01/15  Yes Sueanne Margarita, MD  Omega-3 Fatty Acids (FISH OIL) 1200 MG CAPS Take 1,200 mg by mouth daily.   Yes Historical Provider, MD     Allergies  Allergen Reactions  . Codeine Other (See Comments)    Dizziness; faint  . Hydrocodone-Acetaminophen   . Methocarbamol Other (See Comments)    HA, tinnitus  . Morphine And Related Other (See Comments)    Severe Headache  . Nsaids     BRBPR       Objective:  Physical Exam  Constitutional: He is oriented to person, place, and time. He appears well-developed and well-nourished. He is active and cooperative. No distress.  BP 117/74 mmHg  Pulse 65  Temp(Src) 98 F (36.7 C) (Oral)  Resp 16  Ht 5\' 11"  (1.803 m)  Wt 253 lb 9.6 oz (115.032 kg)  BMI 35.39 kg/m2  SpO2 97%  HENT:  Head: Normocephalic and atraumatic.  Right Ear: Hearing, tympanic membrane, external ear and ear canal normal.  Left Ear: Hearing, tympanic membrane, external ear and ear canal normal.  Nose: Rhinorrhea (with crusting) present. No mucosal edema, sinus tenderness or nasal septal hematoma. Right sinus exhibits maxillary sinus tenderness and frontal sinus tenderness. Left sinus exhibits maxillary sinus tenderness and frontal sinus tenderness.  Mouth/Throat: Uvula is midline, oropharynx is clear and moist and mucous membranes are normal. No oral lesions. No uvula swelling.  Moderate cerumen in both canals. Sinus tenderness is mild.  Eyes: Conjunctivae are normal. No scleral icterus.  Neck: Normal range of motion. Neck supple. No thyromegaly present.  Cardiovascular: Normal rate, regular rhythm and normal heart sounds.   Pulses:      Radial pulses are 2+ on the right side, and 2+ on the left side.  Pulmonary/Chest: Effort normal and breath sounds normal.  Lymphadenopathy:       Head (right side): No  tonsillar, no preauricular, no posterior auricular and no occipital adenopathy present.       Head (left side): No tonsillar, no preauricular, no posterior auricular and no occipital adenopathy present.    He has no cervical adenopathy.       Right: No supraclavicular adenopathy present.       Left: No supraclavicular adenopathy present.  Neurological: He is alert and oriented to person, place, and time. No sensory deficit.  Skin: Skin is warm, dry and intact. No rash noted. No cyanosis or erythema. Nails show no clubbing.  Psychiatric: He has a normal mood and affect. His speech is normal and behavior is normal.           Assessment & Plan:   1. Sinus congestion Likely viral. Supportive care. Add steroid NS. - fluticasone (FLONASE) 50 MCG/ACT nasal spray; Place 2 sprays into both nostrils daily.  Dispense: 16 g; Refill: 12  2. Mixed hyperlipidemia Await labs. - Lipid panel  3. Type 2 diabetes mellitus without complication, without long-term current use of insulin ([place]) Refer to education again. Encouraged elimination of  sugary drinks. - HM Diabetes Foot Exam - Comprehensive metabolic panel - Microalbumin, urine - Pneumococcal polysaccharide vaccine 23-valent greater than or equal to 2yo subcutaneous/IM - Hemoglobin A1c - Ambulatory referral to diabetic education  4. Need for pneumococcal vaccination - Pneumococcal polysaccharide vaccine 23-valent greater than or equal to 2yo subcutaneous/IM   Fara Chute, PA-C Physician Assistant-Certified [facility]

## 2015-10-08 ENCOUNTER — Ambulatory Visit (INDEPENDENT_AMBULATORY_CARE_PROVIDER_SITE_OTHER): Payer: 59 | Admitting: Physician Assistant

## 2015-10-08 VITALS — BP 126/74 | HR 58 | Temp 97.8°F | Resp 16 | Ht 70.5 in | Wt 256.8 lb

## 2015-10-08 DIAGNOSIS — H6121 Impacted cerumen, right ear: Secondary | ICD-10-CM

## 2015-10-08 DIAGNOSIS — H6092 Unspecified otitis externa, left ear: Secondary | ICD-10-CM

## 2015-10-08 MED ORDER — OFLOXACIN 0.3 % OT SOLN: 5.0000 [drp] | Freq: Every day | OTIC | Status: AC

## 2015-10-08 NOTE — Patient Instructions (Signed)
     IF you received an x-ray today, you will receive an invoice from [facility]. Please contact [facility] at [phone] with questions or concerns regarding your invoice.   IF you received labwork today, you will receive an invoice from [facility]. Please contact [facility] at [phone] with questions or concerns regarding your invoice.   Our billing staff will not be able to assist you with questions regarding bills from these companies.  You will be contacted with the lab results as soon as they are available. The fastest way to get your results is to activate your My Chart account. Instructions are located on the last page of this paperwork. If you have not heard from us regarding the results in 2 weeks, please contact this office.      

## 2015-10-08 NOTE — Progress Notes (Signed)
Chief Complaint  Patient presents with  . right ear clogged  . Sore Throat    on right side    History of Present Illness: Patient presents for right ear clogged.  Patient was seen in the primary care office on Tuesday 10/05/15 for nasal congestion and sore throat. He was diagnosed with a viral URI and given a prescription for Flonase.  Today patient states that his nasal congestion has improved with the nasal spray, but his right ear is "clogged and aches". He has decreased hearing on his right side and endorses tinnitus in both ears which is baseline for patient. He is still having a mild sore throat on the right side. He continues to have yellow-green rhinorrhea and mild sinus pressure. He denies any fever, chills, eye irritation, otalgia, cough, sob, cp, N/V/D, or myalgias.  Constitutional: Negative for fever, chills and fatigue.  HENT: Positive for congestion, postnasal drip, rhinorrhea, sinus pressure, sore throat and tinnitus (Bilateral). Negative for ear discharge, ear pain and hearing loss (Right Ear).  Eyes: Negative.  Respiratory: Negative for cough, shortness of breath and wheezing.  Cardiovascular: Negative for chest pain.  Gastrointestinal: Negative for nausea, vomiting, abdominal pain and diarrhea.  Musculoskeletal: Negative for myalgias.  Neurological: Negative for weakness and headaches.    Allergies  Allergen Reactions  . Codeine Other (See Comments)    Dizziness; faint  . Hydrocodone-Acetaminophen   . Methocarbamol Other (See Comments)    HA, tinnitus  . Morphine And Related Other (See Comments)    Severe Headache  . Nsaids     BRBPR    Prior to Admission medications   Medication Sig Start Date End Date Taking? Authorizing Provider  azelastine (ASTELIN) 0.1 % nasal spray Place 1 spray into both nostrils 2 (two) times daily as needed for rhinitis. Use in each nostril as directed   Yes Historical Provider, MD  cholecalciferol (VITAMIN D) 1000 UNITS tablet  Take 1,000 Units by mouth daily.   Yes Historical Provider, MD  diazepam (VALIUM) 10 MG tablet TAKE 1 TABLET EVERY 6 HOURS AS NEEDED FOR ANXIETY (SHOULD QUIT RINGING IN EARS) 08/09/15  Yes Donyetta Ogletree, PA-C  ezetimibe-simvastatin (VYTORIN) 10-40 MG tablet Take 1 tablet by mouth at bedtime. 04/09/15  Yes Keric Zehren, PA-C  fluticasone (FLONASE) 50 MCG/ACT nasal spray Place 2 sprays into both nostrils daily. 10/05/15  Yes Shaila Gilchrest, PA-C  guaiFENesin (MUCINEX) 600 MG 12 hr tablet Take 600 mg by mouth 2 (two) times daily as needed for cough or to loosen phlegm.   Yes Historical Provider, MD  ibuprofen (ADVIL,MOTRIN) 800 MG tablet Take 800 mg by mouth every 8 (eight) hours as needed (PAIN).   Yes Historical Provider, MD  metoprolol succinate (TOPROL-XL) 25 MG 24 hr tablet Take 75mg  (3 tablets) every morning and 25mg  every nightTake with or immediately following a meal. 10/01/15  Yes Sueanne Margarita, MD  Omega-3 Fatty Acids (FISH OIL) 1200 MG CAPS Take 1,200 mg by mouth daily.   Yes Historical Provider, MD  Omega-3 Fatty Acids (FISH OIL) 1000 MG CAPS Take 12,000 mg by mouth daily. Reported on 10/08/2015    Historical Provider, MD    Patient Active Problem List   Diagnosis Date Noted  . OSA (obstructive sleep apnea) 07/27/2015  . Snoring 05/19/2015  . Excessive daytime sleepiness 05/19/2015  . PAF (paroxysmal atrial fibrillation) ([place]) 04/15/2015  . Family history of premature CAD 04/06/2015  . Heart palpitations 04/06/2015  . Diabetes mellitus type 2, uncomplicated ([place]) [id number]  .  BMI 32.0-32.9,adult 09/15/2014  . Degenerative disc disease, lumbar 09/15/2014  . Obesity (BMI 30-39.9) 09/02/2013  . DJD (degenerative joint disease), multiple sites 02/18/2013  . Diverticulosis of sigmoid colon 08/20/2012  . AR (allergic rhinitis) 03/19/2012  . Mixed hyperlipidemia 03/19/2012  . Cardiac murmur   . ED (erectile dysfunction)   . Tinnitus of both ears   . Macular degeneration   . Hernia       Physical Exam  Constitutional: He is oriented to person, place, and time. He appears well-developed and well-nourished. He is active and cooperative. No distress.  BP 126/74 mmHg  Pulse 58  Temp(Src) 97.8 F (36.6 C) (Oral)  Resp 16  Ht 5' 10.5" (1.791 m)  Wt 256 lb 12.8 oz (116.484 kg)  BMI 36.31 kg/m2  SpO2 96%   HENT:  Head: Normocephalic and atraumatic.  Bilateral canals with cerumen impaction. Irrigation partially cleared the LEFT canal, but revealed a red canal and he did not tolerate continued irrigation. RIGHT canal required instillation of colace for cerumen softening, and repeated irrigation resolved impaction on that side completely.  Eyes: Conjunctivae are normal.  Pulmonary/Chest: Effort normal.  Neurological: He is alert and oriented to person, place, and time.  Psychiatric: He has a normal mood and affect. His speech is normal and behavior is normal.      ASSESSMENT & PLAN:  1. Cerumen impaction, right Resolved with irrigation on the RIGHT. Persistent on the LEFT, patient did not tolerate continued irrigation due to pain.  2. Otitis externa, left Due to cerumen impaction and irrigation. - ofloxacin (FLOXIN) 0.3 % otic solution; Place 5 drops into the left ear daily.  Dispense: 5 mL; Refill: 0   Fara Chute, PA-C Physician Assistant-Certified [facility]

## 2015-10-08 NOTE — Progress Notes (Signed)
Subjective:    Patient ID: Craig Roberson, male    DOB: 09/14/1955, 60 y.o.   MRN: [id number]  Chief Complaint  Patient presents with  . right ear clogged    HPI Craig Roberson is a 60 yo male  With pmh significant for PAF, OSA, and Type II DM that presents today for right ear clogged.  Patient was seen in the primary care office on Tuesday 10/05/15 for nasal congestion and sore throat.  He was diagnosed with a viral URI and given a prescription for Flonase.  Today patient states that his nasal congestion has improved with the nasal spray, but his right ear is "clogged and aches". He has decreased hearing on his right side and endorses tinnitus in both ears which is baseline for patient. He is still having a mild sore throat on the right side. He continues to have yellow-green rhinorrhea and mild sinus pressure. He denies any fever, chills, eye irritation, otalgia, cough, sob, cp, N/V/D, or myalgias.   Review of Systems  Constitutional: Negative for fever, chills and fatigue.  HENT: Positive for congestion, postnasal drip, rhinorrhea, sinus pressure, sore throat and tinnitus (Bilateral). Negative for ear discharge, ear pain and hearing loss (Right Ear).   Eyes: Negative.   Respiratory: Negative for cough, shortness of breath and wheezing.   Cardiovascular: Negative for chest pain.  Gastrointestinal: Negative for nausea, vomiting, abdominal pain and diarrhea.  Musculoskeletal: Negative for myalgias.  Neurological: Negative for weakness and headaches.       Objective:   Physical Exam  Constitutional: He is oriented to person, place, and time. He appears well-developed and well-nourished. No distress.  HENT:  Head: Normocephalic and atraumatic.  Right Ear: No tenderness. No decreased hearing is noted.  Left Ear: Tympanic membrane and external ear normal. No tenderness. Decreased hearing is noted.  Nose: Rhinorrhea present. Right sinus exhibits maxillary sinus tenderness. Right  sinus exhibits no frontal sinus tenderness. Left sinus exhibits no maxillary sinus tenderness and no frontal sinus tenderness.  Mouth/Throat: Mucous membranes are normal. Posterior oropharyngeal erythema present. No oropharyngeal exudate or posterior oropharyngeal edema.  Unable to visualize right TM due to cerumen impaction, left TM visualized but significant amount of cerumen present, after irrigation right with good cone of light and landmarks present, no bulging slight irritation of the canal due to irritation, left canal was irrigated and revealed papular erythematous wound that is bleeding, TM not able to be visualized due to cerumen impaction  Eyes: Pupils are equal, round, and reactive to light.  Neck: Normal range of motion. Neck supple. No thyromegaly present.  Cardiovascular: Normal rate, regular rhythm, normal heart sounds and intact distal pulses.   Pulmonary/Chest: Effort normal and breath sounds normal.  Lymphadenopathy:    He has no cervical adenopathy.  Neurological: He is alert and oriented to person, place, and time.  Skin: Skin is warm and dry.  Psychiatric: He has a normal mood and affect. His behavior is normal. Judgment and thought content normal.    Filed Vitals:   10/08/15 0812  BP: 126/74  Pulse: 58  Temp: 97.8 F (36.6 C)  Resp: 16         Assessment & Plan:  1. Cerumen impaction, right 2. Otitis externa, left Irrigation with TM able to be visualized afterwards. No signs of bulging or otitis media. Would present on inferior base of left ear canal. Will provide drops to prevent infection. - ofloxacin (FLOXIN) 0.3 % otic solution; Place 5 drops into the  left ear daily.  Dispense: 5 mL; Refill: 0  Craig Schools PA-S 10/08/2015

## 2015-10-11 ENCOUNTER — Telehealth: Payer: Self-pay

## 2015-10-11 NOTE — Telephone Encounter (Signed)
He should continue the Astelin for another 1-2 weeks. If the symptoms persist at that point, he'll need another visit to reassess and consideration of an antibiotic. He should NOT use an OTC oral decongestant, as it can exacerbate the Afib. He can try up to 3 doses of nasal decongestant spray, neo-synephrine (Afrin), but not more than 3 doses.

## 2015-10-11 NOTE — Telephone Encounter (Signed)
Pt states he had his ears cleaned out and Chelle did a great job, his right ear is still stopped up and he doesn't want to come back in to pay again. Please call [phone] Pt was advised to come back in

## 2015-10-12 NOTE — Telephone Encounter (Signed)
SPoke with pt, advised message from [facility]. Pt understood.

## 2015-10-17 ENCOUNTER — Other Ambulatory Visit: Payer: Self-pay | Admitting: Physician Assistant

## 2015-10-18 ENCOUNTER — Other Ambulatory Visit: Payer: Self-pay | Admitting: Physician Assistant

## 2015-10-18 NOTE — Telephone Encounter (Signed)
Meds ordered this encounter  Medications  . diazepam (VALIUM) 10 MG tablet    Sig: TAKE 1 TABLET BY MOUTH EVERY 6 HOURS AS NEEDED FOR ANXIETY    Dispense:  30 tablet    Refill:  0    Not to exceed 5 additional fills before 02/06/2016.

## 2015-10-19 ENCOUNTER — Encounter: Payer: Self-pay | Admitting: Physician Assistant

## 2015-10-19 NOTE — Telephone Encounter (Signed)
Faxed

## 2015-10-21 ENCOUNTER — Telehealth: Payer: Self-pay

## 2015-10-21 MED ORDER — BLOOD GLUCOSE MONITOR KIT: PACK | Status: DC

## 2015-10-21 MED ORDER — GLUCOSE BLOOD VI STRP: ORAL_STRIP | Status: DC

## 2015-10-21 MED ORDER — LANCETS MISC: Status: DC

## 2015-10-21 NOTE — Telephone Encounter (Signed)
lmom that rx's sent in and to call back with any questions.

## 2015-10-21 NOTE — Telephone Encounter (Signed)
Pt is needing an rx for a diabetic testing kit and test strips  Best number [phone]

## 2015-10-25 ENCOUNTER — Encounter: Payer: Self-pay | Admitting: Physician Assistant

## 2015-10-25 ENCOUNTER — Ambulatory Visit (INDEPENDENT_AMBULATORY_CARE_PROVIDER_SITE_OTHER): Payer: 59 | Admitting: Physician Assistant

## 2015-10-25 VITALS — BP 106/62 | HR 58 | Ht 69.5 in | Wt 262.0 lb

## 2015-10-25 DIAGNOSIS — E669 Obesity, unspecified: Secondary | ICD-10-CM | POA: Diagnosis not present

## 2015-10-25 DIAGNOSIS — G4733 Obstructive sleep apnea (adult) (pediatric): Secondary | ICD-10-CM

## 2015-10-25 DIAGNOSIS — I480 Paroxysmal atrial fibrillation: Secondary | ICD-10-CM

## 2015-10-25 NOTE — Patient Instructions (Signed)
Avoid Caffeine   No Sodas No Tea  Weight Loss Program Weight Watchers   Your physician wants you to follow-up in: 4 months with Dr.Turner. You will receive a reminder letter in the mail two months in advance. If you don't receive a letter, please call our office to schedule the follow-up appointment.

## 2015-10-25 NOTE — Progress Notes (Signed)
Cardiology Office Note    Date:  10/25/2015   ID:  Craig Roberson, DOB 11/24/1955, MRN [id number]  PCP:  JEFFERY,CHELLE, PA-C  Cardiologist: Dr. [name]   Chief Complaint  Patient presents with  . Follow-up    3/4 week f/u to Dr. Thomasena Edis 10/01/15, no sx    History of Present Illness:  Craig Roberson is a 60 y.o. male who is here for four-week follow-up for palpitations. He has a history of PAF, CHADSVASC=0 so no anticoagulation. On last office visit he was having worsening palpitations and his metoprolol was increased to 75 mg in the morning 25 in the evening.  Patient comes in today saying he is doing a little bit better but had a bad day yesterday. He drank 2 sodas followed by 2 cups of sweet tea. He had palpitations that lasted about 30 minutes. They went away spontaneously and came back later in the day and last about 15 minutes. He went away when he took his evening dose of metoprolol low bit early. He knows he has to cut back on the caffeine and lose weight. He was told he is diabetic now. He also has OSA. He had negative nuclear stress test in 03/2015.    Past Medical History  Diagnosis Date  . Arthritis     bilateral thumbs  . Allergy   . Cardiac murmur 1996    normal ECHO  . Hyperlipidemia   . ED (erectile dysfunction) 2006  . H/O ETOH abuse     Quit 2006  . Tinnitus of both ears   . BCC (basal cell carcinoma of skin) 04/2006    LEFT low back  . Ulcer   . Macular degeneration   . Diverticulosis   . Hernia     RIGHT inguinal  . PAF (paroxysmal atrial fibrillation) ([place]) 04/15/2015    During exercise myoview    Past Surgical History  Procedure Laterality Date  . Ganglion cyst excision  1970's  . Thumb fusion      BILATERAL  . Lumbar fusion      L3-4; Dr. Ellene Route  . Vasectomy  1976  . Spine surgery      Current Medications: Outpatient Prescriptions Prior to Visit  Medication Sig Dispense Refill  . azelastine (ASTELIN) 0.1 % nasal spray Place  1 spray into both nostrils 2 (two) times daily as needed for rhinitis. Use in each nostril as directed    . cholecalciferol (VITAMIN D) 1000 UNITS tablet Take 1,000 Units by mouth daily.    . diazepam (VALIUM) 10 MG tablet TAKE 1 TABLET BY MOUTH EVERY 6 HOURS AS NEEDED FOR ANXIETY 30 tablet 0  . ezetimibe-simvastatin (VYTORIN) 10-40 MG tablet Take 1 tablet by mouth at bedtime. 90 tablet 1  . fluticasone (FLONASE) 50 MCG/ACT nasal spray Place 2 sprays into both nostrils daily. 16 g 12  . guaiFENesin (MUCINEX) 600 MG 12 hr tablet Take 600 mg by mouth 2 (two) times daily as needed for cough or to loosen phlegm.    Marland Kitchen ibuprofen (ADVIL,MOTRIN) 800 MG tablet Take 800 mg by mouth every 8 (eight) hours as needed (PAIN).    . Omega-3 Fatty Acids (FISH OIL) 1000 MG CAPS Take 12,000 mg by mouth daily. Reported on 10/08/2015    . blood glucose meter kit and supplies KIT Dispense based on patient and insurance preference. DX: E11.9 1 each 0  . glucose blood (GLUCOSE METER TEST) test strip Test sugar once a day.  DX: E11.9  50 each 2  . Lancets MISC Test sugar once a day.  DX: E11.9 100 each 2  . metoprolol succinate (TOPROL-XL) 25 MG 24 hr tablet Take 66m (3 tablets) every morning and 24mevery nightTake with or immediately following a meal. 360 tablet 3  . Omega-3 Fatty Acids (FISH OIL) 1200 MG CAPS Take 1,200 mg by mouth daily.     No facility-administered medications prior to visit.     Allergies:   Codeine; Hydrocodone-acetaminophen; Methocarbamol; Morphine and related; and Nsaids   Social History   Social History  . Marital Status: Married    Spouse Name: GrMarvene Staff. Number of Children: 2  . Years of Education: 8   Occupational History  . Retired     GrCareers information officer Social History Main Topics  . Smoking status: Former SmResearch scientist (life sciences). Smokeless tobacco: Former Us[employer]s developer  Types: Chew     Comment: some days  . Alcohol Use: No  . Drug Use: No  . Sexual Activity:    Partners: Female      Comment: family stress; his back pain have reduced the frequency   Other Topics Concern  . None   Social History Narrative   Married to 3rd wife.     Family History:  The patient's   family history includes Atrial fibrillation in his brother; Cancer in his father; Heart attack in his father; Heart disease (age of onset: 5446in his father; Heart disease (age of onset: 6677in his brother; Hyperlipidemia in his mother; Hypertension in his brother; Stroke in his father; Stroke (age of onset: 3558in his son; Vascular Disease in his brother.   ROS:   Please see the history of present illness.    Review of Systems  Constitution: Positive for weight gain.  HENT: Positive for hearing loss.   Cardiovascular: Positive for irregular heartbeat, leg swelling and palpitations.  Respiratory: Positive for wheezing.   Musculoskeletal: Positive for back pain, joint swelling and myalgias.   All other systems reviewed and are negative.   PHYSICAL EXAM:   VS:  BP 106/62 mmHg  Pulse 58  Ht 5' 9.5" (1.765 m)  Wt 262 lb (118.842 kg)  BMI 38.15 kg/m2  SpO2 95%   GEN: Well nourished, well developed, in no acute distress Neck: no JVD, carotid bruits, or masses Cardiac: RRR; no murmurs, rubs, or gallops,no edema  Respiratory:  clear to auscultation bilaterally, normal work of breathing GI: soft, nontender, nondistended, + BS MS: no deformity or atrophy Skin: warm and dry, no rash Neuro:  Alert and Oriented x 3, Strength and sensation are intact Psych: euthymic mood, full affect  Wt Readings from Last 3 Encounters:  10/25/15 262 lb (118.842 kg)  10/08/15 256 lb 12.8 oz (116.484 kg)  10/05/15 253 lb 9.6 oz (115.032 kg)      Studies/Labs Reviewed:   EKG:  EKG is not ordered today.  Recent Labs: 08/23/2015: TSH 1.75 10/05/2015: ALT 16; BUN 16; Creat 0.86; Potassium 4.5; Sodium 138   Lipid Panel    Component Value Date/Time   CHOL 207* 10/05/2015 1156   TRIG 152* 10/05/2015 1156   HDL 37*  10/05/2015 1156   CHOLHDL 5.6* 10/05/2015 1156   VLDL 30 10/05/2015 1156   LDLCALC 140* 10/05/2015 1156    Additional studies/ records that were reviewed today include:  03/2015 Study Highlights      Nuclear stress EF: 59%.  The study is normal.  This is a low risk  study.  The left ventricular ejection fraction is normal (55-65%).   Normal stress perfusion images Resting images poor quality with low counts and artifact EF 59% no RWMA;s Patient developed prolonged episode of PAF with fast rates and some ST changes lasting about 20 minutes Rx with iv cardizem and lopressor   Nursing notes indicate conversion to NSR but last strips provided for reading showed afib still      ASSESSMENT:    1. PAF (paroxysmal atrial fibrillation) ([place])   2. OSA (obstructive sleep apnea)   3. Obesity (BMI 30-39.9)      PLAN:  In order of problems listed above:  PAF patient is having increased palpitations especially when he drinks caffeine. I told him he really needs to stop drinking soda and sweet tea. This will also help with his diabetes and obesity. Recommend Weight Watchers or similar weight-loss program as well as exercise program. This will also help with his OSA. He can take an extra metoprolol needed for palpitations but his blood pressures on the low side. Follow-up with Dr. [name] in 4 months.  Medication Adjustments/Labs and Tests Ordered: Current medicines are reviewed at length with the patient today.  Concerns regarding medicines are outlined above.  Medication changes, Labs and Tests ordered today are listed in the Patient Instructions below. Patient Instructions  Avoid Caffeine   No Sodas No Tea  Weight Loss Program Weight Watchers   Your physician wants you to follow-up in: 4 months with Dr.Turner. You will receive a reminder letter in the mail two months in advance. If you don't receive a letter, please call our office to schedule the follow-up appointment.       Sumner Boast, PA-C  10/25/2015 8:41 AM    [facility] [address], [place], [place]  [address] Phone: [phone]; Fax: [phone]

## 2015-11-04 ENCOUNTER — Ambulatory Visit: Payer: 59 | Admitting: *Deleted

## 2015-11-11 ENCOUNTER — Other Ambulatory Visit: Payer: Self-pay | Admitting: Physician Assistant

## 2015-11-12 ENCOUNTER — Other Ambulatory Visit: Payer: Self-pay | Admitting: Physician Assistant

## 2015-11-12 DIAGNOSIS — E782 Mixed hyperlipidemia: Secondary | ICD-10-CM

## 2015-11-12 NOTE — Telephone Encounter (Signed)
Faxed

## 2015-11-12 NOTE — Telephone Encounter (Signed)
Meds ordered this encounter  Medications  . diazepam (VALIUM) 10 MG tablet    Sig: TAKE 1 TABLET BY MOUTH EVERY 6 HOURS AS NEEDED FOR ANXIETY    Dispense:  30 tablet    Refill:  0    Not to exceed 5 additional fills before 04/16/2016.

## 2015-11-17 ENCOUNTER — Other Ambulatory Visit: Payer: Self-pay | Admitting: Physician Assistant

## 2015-11-18 MED ORDER — EZETIMIBE-SIMVASTATIN 10-40 MG PO TABS
1.0000 | ORAL_TABLET | Freq: Every day | ORAL | Status: DC
Start: 2015-11-18 — End: 2016-06-13

## 2015-11-18 NOTE — Telephone Encounter (Signed)
Meds ordered this encounter  Medications  . ezetimibe-simvastatin (VYTORIN) 10-40 MG tablet    Sig: Take 1 tablet by mouth at bedtime.    Dispense:  90 tablet    Refill:  1    Order Specific Question:  Supervising Provider    Answer:  DOOLITTLE, ROBERT P [id number]

## 2015-11-26 ENCOUNTER — Ambulatory Visit: Payer: 59 | Admitting: *Deleted

## 2015-12-05 ENCOUNTER — Other Ambulatory Visit: Payer: Self-pay | Admitting: Physician Assistant

## 2015-12-08 NOTE — Telephone Encounter (Signed)
Patient is calling because the pharmacy hasn't heard anything. Patient needs a refill for diazepam [phone]

## 2015-12-09 NOTE — Telephone Encounter (Signed)
Just waiting on review by provider. I will re-route it high priority.

## 2015-12-10 ENCOUNTER — Other Ambulatory Visit: Payer: Self-pay | Admitting: Physician Assistant

## 2015-12-10 ENCOUNTER — Telehealth: Payer: Self-pay | Admitting: Physician Assistant

## 2015-12-10 NOTE — Telephone Encounter (Signed)
Faxed. Notified pt  

## 2015-12-10 NOTE — Telephone Encounter (Signed)
Meds ordered this encounter  Medications  . diazepam (VALIUM) 10 MG tablet    Sig: TAKE 1 TABLET BY MOUTH EVERY 6 HOURS AS NEEDED FOR ANXIETY    Dispense:  30 tablet    Refill:  0    Not to exceed 5 additional fills before 05/10/2016.

## 2015-12-10 NOTE — Telephone Encounter (Signed)
Patient pharmacy [facility] on [address] sent in a medication refill on 12/05/2015. No response. Patient is calling for a refill on Diazepam 10 MG. [phone].

## 2015-12-14 NOTE — Telephone Encounter (Signed)
Pt was notified when Rx was done on 6/16.

## 2015-12-23 ENCOUNTER — Encounter: Payer: Self-pay | Admitting: *Deleted

## 2015-12-23 ENCOUNTER — Encounter: Payer: 59 | Attending: Physician Assistant | Admitting: *Deleted

## 2015-12-23 VITALS — Ht 70.0 in | Wt 260.4 lb

## 2015-12-23 DIAGNOSIS — E119 Type 2 diabetes mellitus without complications: Secondary | ICD-10-CM | POA: Insufficient documentation

## 2015-12-23 NOTE — Patient Instructions (Signed)
Plan:  Aim for 4 Carb Choices per meal (60 grams) +/- 1 either way  Aim for 0-2 Carbs per snack if hungry  Consider asking your self if your are still hungry after finishing 1st plate of food. If still hungry, enjoy some extra vegetables and meat, not starches. Consider  increasing your activity level by walking in the AM daily and increase distance or time as tolerated If not able to walk, think about arm chair exercises as a back up plan Consider checking BG at alternate times per day

## 2015-12-23 NOTE — Progress Notes (Signed)
Diabetes Self-Management Education  Visit Type: First/Initial  Appt. Start Time: 0845 Appt. End Time: [id number]  12/23/2015  Mr. Craig Roberson, identified by name and date of birth, is a 60 y.o. male with a diagnosis of Diabetes: Type 2.   ASSESSMENT  Height 5\' 10"  (1.778 m), weight 260 lb 6.4 oz (118.117 kg). Body mass index is 37.36 kg/(m^2).      Diabetes Self-Management Education - 12/23/15 0839    Visit Information   Visit Type First/Initial   Initial Visit   Diabetes Type Type 2   Are you currently following a meal plan? No   Are you taking your medications as prescribed? Not on Medications   Date Diagnosed within past year   Health Coping   How would you rate your overall health? Good   Psychosocial Assessment   Patient Belief/Attitude about Diabetes Denial   Self-management support Doctor's office   Other persons present Patient   Patient Concerns Nutrition/Meal planning;Glycemic Control;Weight Control   Special Needs Simplified materials   Preferred Learning Style Auditory;Hands on   What is the last grade level you completed in school? 8   Pre-Education Assessment   Patient understands the diabetes disease and treatment process. Needs Instruction   Patient understands incorporating nutritional management into lifestyle. Needs Instruction   Patient undertands incorporating physical activity into lifestyle. Needs Instruction   Patient understands using medications safely. --  not on diabetes medication   Patient understands monitoring blood glucose, interpreting and using results Needs Instruction   Patient understands prevention, detection, and treatment of acute complications. Needs Instruction   Patient understands prevention, detection, and treatment of chronic complications. Needs Instruction   Patient understands how to develop strategies to address psychosocial issues. Needs Instruction   Patient understands how to develop strategies to promote health/change  behavior. Needs Instruction   Complications   Last HgB A1C per patient/outside source 6.9 %   How often do you check your blood sugar? 0 times/day (not testing)  he states he has a meter and is testing infrequently   Have you had a dilated eye exam in the past 12 months? Yes   Have you had a dental exam in the past 12 months? Yes   Are you checking your feet? No   Dietary Intake   Breakfast gravy or tenderlin biscuit OR PNB sandwich OR cookies    Lunch skips usually, snacks throughout the day on cookies, or whatever he feels like eating   Dinner pinto beans, occasionally meat, vegetables, couple slices of breador tortillas with chicken   Snack (evening) cookies, etc   Beverage(s) used to drink beer, but hasn't had alcohol for 10 years now. then he started wanting regular sodas. Now he drinks black coffee, diet or regular soda,    Exercise   Exercise Type Light (walking / raking leaves)   How many days per week to you exercise? 4.5   How many minutes per day do you exercise? 15   Total minutes per week of exercise 67.5   Patient Education   Previous Diabetes Education No   Disease state  Definition of diabetes, type 1 and 2, and the diagnosis of diabetes   Nutrition management  Carbohydrate counting;Role of diet in the treatment of diabetes and the relationship between the three main macronutrients and blood glucose level   Physical activity and exercise  Role of exercise on diabetes management, blood pressure control and cardiac health.   Monitoring Taught/evaluated SMBG meter.;Identified appropriate SMBG and/or A1C goals.;Purpose  and frequency of SMBG.   Chronic complications Relationship between chronic complications and blood glucose control   Individualized Goals (developed by patient)   Nutrition Follow meal plan discussed;General guidelines for healthy choices and portions discussed   Physical Activity Exercise 3-5 times per week   Monitoring  test blood glucose pre and post meals  as discussed   Post-Education Assessment   Patient understands incorporating nutritional management into lifestyle. Demonstrates understanding / competency   Patient undertands incorporating physical activity into lifestyle. Demonstrates understanding / competency   Patient understands monitoring blood glucose, interpreting and using results Demonstrates understanding / competency   Outcomes   Expected Outcomes Demonstrated interest in learning. Expect positive outcomes   Future DMSE 4-6 wks   Program Status Not Completed      Individualized Plan for Diabetes Self-Management Training:   Learning Objective:  Patient will have a greater understanding of diabetes self-management. Patient education plan is to attend individual and/or group sessions per assessed needs and concerns.   Plan:   Patient Instructions  Plan:  Aim for 4 Carb Choices per meal (60 grams) +/- 1 either way  Aim for 0-2 Carbs per snack if hungry  Consider asking your self if your are still hungry after finishing 1st plate of food. If still hungry, enjoy some extra vegetables and meat, not starches. Consider  increasing your activity level by walking in the AM daily and increase distance or time as tolerated If not able to walk, think about arm chair exercises as a back up plan Consider checking BG at alternate times per day        Expected Outcomes:  Demonstrated interest in learning. Expect positive outcomes  Education material provided: Living Well with Diabetes, A1C conversion sheet, Meal plan card and My Plate  If problems or questions, patient to contact team via:  Phone  Future DSME appointment: 4-6 wks

## 2016-01-07 ENCOUNTER — Other Ambulatory Visit: Payer: Self-pay | Admitting: Physician Assistant

## 2016-01-07 NOTE — Telephone Encounter (Signed)
Pt is checking on the status of his med refill. Stated he has been without for 3 days now.   Please advise  [phone]

## 2016-01-11 ENCOUNTER — Ambulatory Visit: Payer: 59 | Admitting: Physician Assistant

## 2016-01-11 NOTE — Telephone Encounter (Signed)
Meds ordered this encounter  Medications  . diazepam (VALIUM) 10 MG tablet    Sig: TAKE 1 TABLET BY MOUTH EVERY 6 HOURS AS NEEDED FOR ANXIETY    Dispense:  30 tablet    Refill:  0    Not to exceed 5 additional fills before 06/07/2016.

## 2016-01-12 NOTE — Telephone Encounter (Signed)
Faxed

## 2016-01-26 ENCOUNTER — Ambulatory Visit: Payer: 59 | Admitting: *Deleted

## 2016-02-04 ENCOUNTER — Telehealth: Payer: Self-pay

## 2016-02-04 ENCOUNTER — Other Ambulatory Visit: Payer: Self-pay | Admitting: Physician Assistant

## 2016-02-04 NOTE — Telephone Encounter (Signed)
Patient request a refill of Valium 10 MG. [place]. [phone].

## 2016-02-07 MED ORDER — DIAZEPAM 10 MG PO TABS: 10.0000 mg | ORAL_TABLET | Freq: Four times a day (QID) | ORAL | 0 refills | Status: DC | PRN

## 2016-02-07 NOTE — Telephone Encounter (Signed)
Meds ordered this encounter  Medications  . diazepam (VALIUM) 10 MG tablet    Sig: Take 1 tablet (10 mg total) by mouth every 6 (six) hours as needed. for anxiety    Dispense:  30 tablet    Refill:  0    Not to exceed 5 additional fills before 06/07/2016.    Order Specific Question:   Supervising Provider    Answer:   Shawnee Knapp [phone]

## 2016-02-08 ENCOUNTER — Ambulatory Visit: Payer: 59 | Admitting: Physician Assistant

## 2016-02-08 NOTE — Telephone Encounter (Signed)
Faxed and notified pt. Updated his cell # in [employer] which was incorrect. The message reminding of pt's appt today went to the incorrect # and pt was transferred to clerical to re-schedule appt.

## 2016-02-22 ENCOUNTER — Encounter: Payer: Self-pay | Admitting: Physician Assistant

## 2016-02-22 ENCOUNTER — Ambulatory Visit: Payer: 59 | Admitting: Physician Assistant

## 2016-02-22 ENCOUNTER — Ambulatory Visit (INDEPENDENT_AMBULATORY_CARE_PROVIDER_SITE_OTHER): Payer: 59 | Admitting: Physician Assistant

## 2016-02-22 VITALS — BP 134/88 | HR 60 | Temp 97.4°F | Resp 18 | Ht 70.0 in | Wt 263.0 lb

## 2016-02-22 DIAGNOSIS — L989 Disorder of the skin and subcutaneous tissue, unspecified: Secondary | ICD-10-CM

## 2016-02-22 DIAGNOSIS — Z23 Encounter for immunization: Secondary | ICD-10-CM | POA: Diagnosis not present

## 2016-02-22 DIAGNOSIS — L918 Other hypertrophic disorders of the skin: Secondary | ICD-10-CM

## 2016-02-22 DIAGNOSIS — E119 Type 2 diabetes mellitus without complications: Secondary | ICD-10-CM

## 2016-02-22 DIAGNOSIS — E782 Mixed hyperlipidemia: Secondary | ICD-10-CM | POA: Diagnosis not present

## 2016-02-22 DIAGNOSIS — H9313 Tinnitus, bilateral: Secondary | ICD-10-CM | POA: Diagnosis not present

## 2016-02-22 LAB — HEMOGLOBIN A1C
Hgb A1c MFr Bld: 6.8 % — ABNORMAL HIGH (ref <5.7)
Mean Plasma Glucose: 148 mg/dL

## 2016-02-22 MED ORDER — DIAZEPAM 10 MG PO TABS: 10.0000 mg | ORAL_TABLET | Freq: Four times a day (QID) | ORAL | 0 refills | Status: DC | PRN

## 2016-02-22 NOTE — Patient Instructions (Addendum)
   IF you received an x-ray today, you will receive an invoice from [facility]. Please contact [facility] at [phone] with questions or concerns regarding your invoice.   IF you received labwork today, you will receive an invoice from [facility]. Please contact [facility] at [phone] with questions or concerns regarding your invoice.   Our billing staff will not be able to assist you with questions regarding bills from these companies.  You will be contacted with the lab results as soon as they are available. The fastest way to get your results is to activate your My Chart account. Instructions are located on the last page of this paperwork. If you have not heard from us regarding the results in 2 weeks, please contact this office.    Influenza (Flu) Vaccine (Inactivated or Recombinant):  1. Why get vaccinated? Influenza ("flu") is a contagious disease that spreads around the [place] every year, usually between October and May. Flu is caused by influenza viruses, and is spread mainly by coughing, sneezing, and close contact. Anyone can get flu. Flu strikes suddenly and can last several days. Symptoms vary by age, but can include:  fever/chills  sore throat  muscle aches  fatigue  cough  headache  runny or stuffy nose Flu can also lead to pneumonia and blood infections, and cause diarrhea and seizures in children. If you have a medical condition, such as heart or lung disease, flu can make it worse. Flu is more dangerous for some people. Infants and young children, people 65 years of age and older, pregnant women, and people with certain health conditions or a weakened immune system are at greatest risk. Each year thousands of people in the [place] die from flu, and many more are hospitalized. Flu vaccine can:  keep you from getting flu,  make flu less severe if you do get it, and  keep you from spreading flu to  your family and other people. 2. Inactivated and recombinant flu vaccines A dose of flu vaccine is recommended every flu season. Children 6 months through 8 years of age may need two doses during the same flu season. Everyone else needs only one dose each flu season. Some inactivated flu vaccines contain a very small amount of a mercury-based preservative called thimerosal. Studies have not shown thimerosal in vaccines to be harmful, but flu vaccines that do not contain thimerosal are available. There is no live flu virus in flu shots. They cannot cause the flu. There are many flu viruses, and they are always changing. Each year a new flu vaccine is made to protect against three or four viruses that are likely to cause disease in the upcoming flu season. But even when the vaccine doesn't exactly match these viruses, it may still provide some protection. Flu vaccine cannot prevent:  flu that is caused by a virus not covered by the vaccine, or  illnesses that look like flu but are not. It takes about 2 weeks for protection to develop after vaccination, and protection lasts through the flu season. 3. Some people should not get this vaccine Tell the person who is giving you the vaccine:  If you have any severe, life-threatening allergies. If you ever had a life-threatening allergic reaction after a dose of flu vaccine, or have a severe allergy to any part of this vaccine, you may be advised not to get vaccinated. Most, but not all, types of flu vaccine contain a small amount of egg protein.    If you ever had Guillain-Barre Syndrome (also called GBS). Some people with a history of GBS should not get this vaccine. This should be discussed with your doctor.  If you are not feeling well. It is usually okay to get flu vaccine when you have a mild illness, but you might be asked to come back when you feel better. 4. Risks of a vaccine reaction With any medicine, including vaccines, there is a chance of  reactions. These are usually mild and go away on their own, but serious reactions are also possible. Most people who get a flu shot do not have any problems with it. Minor problems following a flu shot include:  soreness, redness, or swelling where the shot was given  hoarseness  sore, red or itchy eyes  cough  fever  aches  headache  itching  fatigue If these problems occur, they usually begin soon after the shot and last 1 or 2 days. More serious problems following a flu shot can include the following:  There may be a small increased risk of Guillain-Barre Syndrome (GBS) after inactivated flu vaccine. This risk has been estimated at 1 or 2 additional cases per million people vaccinated. This is much lower than the risk of severe complications from flu, which can be prevented by flu vaccine.  Young children who get the flu shot along with pneumococcal vaccine (PCV13) and/or DTaP vaccine at the same time might be slightly more likely to have a seizure caused by fever. Ask your doctor for more information. Tell your doctor if a child who is getting flu vaccine has ever had a seizure. Problems that could happen after any injected vaccine:  People sometimes faint after a medical procedure, including vaccination. Sitting or lying down for about 15 minutes can help prevent fainting, and injuries caused by a fall. Tell your doctor if you feel dizzy, or have vision changes or ringing in the ears.  Some people get severe pain in the shoulder and have difficulty moving the arm where a shot was given. This happens very rarely.  Any medication can cause a severe allergic reaction. Such reactions from a vaccine are very rare, estimated at about 1 in a million doses, and would happen within a few minutes to a few hours after the vaccination. As with any medicine, there is a very remote chance of a vaccine causing a serious injury or death. The safety of vaccines is always being monitored. For  more information, visit: www.cdc.gov/vaccinesafety/ 5. What if there is a serious reaction? What should I look for?  Look for anything that concerns you, such as signs of a severe allergic reaction, very high fever, or unusual behavior. Signs of a severe allergic reaction can include hives, swelling of the face and throat, difficulty breathing, a fast heartbeat, dizziness, and weakness. These would start a few minutes to a few hours after the vaccination. What should I do?  If you think it is a severe allergic reaction or other emergency that can't wait, call 9-1-1 and get the person to the nearest hospital. Otherwise, call your doctor.  Reactions should be reported to the Vaccine Adverse Event Reporting System (VAERS). Your doctor should file this report, or you can do it yourself through the VAERS web site at www.vaers.hhs.gov, or by calling 1-800-822-7967. VAERS does not give medical advice. 6. The National Vaccine Injury Compensation Program The National Vaccine Injury Compensation Program (VICP) is a federal program that was created to compensate people who may have been   injured by certain vaccines. Persons who believe they may have been injured by a vaccine can learn about the program and about filing a claim by calling 1-800-338-2382 or visiting the VICP website at www.hrsa.gov/vaccinecompensation. There is a time limit to file a claim for compensation. 7. How can I learn more?  Ask your healthcare provider. He or she can give you the vaccine package insert or suggest other sources of information.  Call your local or state health department.  Contact the Centers for Disease Control and Prevention (CDC):  Call 1-800-232-4636 (1-800-CDC-INFO) or  Visit CDC's website at www.cdc.gov/flu Vaccine Information Statement Inactivated Influenza Vaccine (01/30/2014)   This information is not intended to replace advice given to you by your health care provider. Make sure you discuss any  questions you have with your health care provider.   Document Released: 04/06/2006 Document Revised: 07/03/2014 Document Reviewed: 02/02/2014 Elsevier Interactive Patient Education 2016 Elsevier Inc.  

## 2016-02-22 NOTE — Progress Notes (Signed)
Patient ID: Craig Roberson, male    DOB: 05-Dec-1955, 60 y.o.   MRN: [id number]  PCP: [name], PA-C  Subjective:   Chief Complaint  Patient presents with  . Follow-up    HPI Presents for evaluation of diabetes, hyperlipidemia.  He's doing well on his current medications without adverse effects. Feels that he learned a lot at the diabetes education classes, and is working on healthier lifestyle changes.  Tinnitus continues. Arthritis pain persists, worst in the thumbs.    Review of Systems  Constitutional: Negative for activity change, appetite change, fatigue and unexpected weight change.  HENT: Positive for tinnitus. Negative for congestion, dental problem, ear pain, hearing loss, mouth sores, postnasal drip, rhinorrhea, sneezing, sore throat and trouble swallowing.   Eyes: Negative for photophobia, pain, redness and visual disturbance.  Respiratory: Negative for cough, chest tightness and shortness of breath.   Cardiovascular: Negative for chest pain, palpitations and leg swelling.  Gastrointestinal: Negative for abdominal pain, blood in stool, constipation, diarrhea, nausea and vomiting.  Genitourinary: Negative for dysuria, frequency, hematuria and urgency.  Musculoskeletal: Positive for arthralgias and back pain. Negative for gait problem, myalgias and neck stiffness.  Skin: Negative for rash.  Neurological: Negative for dizziness, speech difficulty, weakness, light-headedness, numbness and headaches.  Hematological: Negative for adenopathy.  Psychiatric/Behavioral: Negative for confusion and sleep disturbance. The patient is not nervous/anxious.        Patient Active Problem List   Diagnosis Date Noted  . OSA (obstructive sleep apnea) 07/27/2015  . Snoring 05/19/2015  . Excessive daytime sleepiness 05/19/2015  . PAF (paroxysmal atrial fibrillation) ([place]) 04/15/2015  . Family history of premature CAD 04/06/2015  . Heart palpitations 04/06/2015  .  Diabetes mellitus type 2, uncomplicated ([place]) [id number]  . BMI 32.0-32.9,adult 09/15/2014  . Degenerative disc disease, lumbar 09/15/2014  . Obesity (BMI 30-39.9) 09/02/2013  . DJD (degenerative joint disease), multiple sites 02/18/2013  . Diverticulosis of sigmoid colon 08/20/2012  . AR (allergic rhinitis) 03/19/2012  . Mixed hyperlipidemia 03/19/2012  . Cardiac murmur   . ED (erectile dysfunction)   . Tinnitus of both ears   . Macular degeneration   . Hernia      Prior to Admission medications   Medication Sig Start Date End Date Taking? Authorizing Provider  azelastine (ASTELIN) 0.1 % nasal spray Place 1 spray into both nostrils 2 (two) times daily as needed for rhinitis. Use in each nostril as directed   Yes Historical Provider, MD  cholecalciferol (VITAMIN D) 1000 UNITS tablet Take 1,000 Units by mouth daily.   Yes Historical Provider, MD  diazepam (VALIUM) 10 MG tablet Take 1 tablet (10 mg total) by mouth every 6 (six) hours as needed. for anxiety 02/07/16  Yes Merelyn Klump, PA-C  ezetimibe-simvastatin (VYTORIN) 10-40 MG tablet Take 1 tablet by mouth at bedtime. 11/18/15  Yes Liah Morr, PA-C  fluticasone (FLONASE) 50 MCG/ACT nasal spray Place 2 sprays into both nostrils daily. 10/05/15  Yes Dawaun Brancato, PA-C  guaiFENesin (MUCINEX) 600 MG 12 hr tablet Take 600 mg by mouth 2 (two) times daily as needed for cough or to loosen phlegm.   Yes Historical Provider, MD  ibuprofen (ADVIL,MOTRIN) 800 MG tablet Take 800 mg by mouth every 8 (eight) hours as needed (PAIN).   Yes Historical Provider, MD  metoprolol tartrate (LOPRESSOR) 25 MG tablet TAKE 75 MG BY MOUTH IN THE AM & 25 MG BY MOUTH IN THE PM   Yes Historical Provider, MD  Omega-3 Fatty Acids (Benjamin Perez)  1000 MG CAPS Take 12,000 mg by mouth daily. Reported on 10/08/2015   Yes Historical Provider, MD     Allergies  Allergen Reactions  . Codeine Other (See Comments)    Dizziness; faint  . Hydrocodone-Acetaminophen      Unknown per pt   . Methocarbamol Other (See Comments)    HA, tinnitus  . Morphine And Related Other (See Comments)    Severe Headache  . Nsaids     BRBPR       Objective:  Physical Exam  Constitutional: He is oriented to person, place, and time. He appears well-developed and well-nourished. He is active and cooperative. No distress.  BP 134/88 (BP Location: Right Arm, Patient Position: Sitting, Cuff Size: Large)   Pulse 60   Temp 97.4 F (36.3 C) (Oral)   Resp 18   Ht 5\' 10"  (1.778 m)   Wt 263 lb (119.3 kg)   SpO2 97%   BMI 37.74 kg/m   HENT:  Head: Normocephalic and atraumatic.  Right Ear: Hearing normal.  Left Ear: Hearing normal.  Eyes: Conjunctivae are normal. No scleral icterus.  Neck: Normal range of motion. Neck supple. No thyromegaly present.  Cardiovascular: Normal rate, regular rhythm and normal heart sounds.   Pulses:      Radial pulses are 2+ on the right side, and 2+ on the left side.  AFib not appreciated  Pulmonary/Chest: Effort normal and breath sounds normal.  Lymphadenopathy:       Head (right side): No tonsillar, no preauricular, no posterior auricular and no occipital adenopathy present.       Head (left side): No tonsillar, no preauricular, no posterior auricular and no occipital adenopathy present.    He has no cervical adenopathy.       Right: No supraclavicular adenopathy present.       Left: No supraclavicular adenopathy present.  Neurological: He is alert and oriented to person, place, and time. No sensory deficit.  Skin: Skin is warm, dry and intact. No rash noted. No cyanosis or erythema. Nails show no clubbing.     Multiple skin tags of the lateral trunk.  Psychiatric: He has a normal mood and affect. His speech is normal and behavior is normal.           Assessment & Plan:   1. Type 2 diabetes mellitus without complication, without long-term current use of insulin ([place]) Await labs. Adjust regimen as indicated by results. Continue  efforts for healthier lifestyle choices. - Comprehensive metabolic panel - Hemoglobin A1c  2. Mixed hyperlipidemia Await labs. Adjust regimen as indicated by results. Expect improvement with improved glucose control. - Comprehensive metabolic panel - Lipid panel  3. Flu vaccine need - Flu Vaccine QUAD 36+ mos IM  4. Tinnitus, bilateral Controlled. Stable. Continue current treatment. - diazepam (VALIUM) 10 MG tablet; Take 1 tablet (10 mg total) by mouth every 6 (six) hours as needed. for anxiety  Dispense: 30 tablet; Refill: 0  5. Skin lesion, superficial Schedule excisional biopsy at his convenience.  6. Skin tag Can be removed at his convenience.  Return in about 3 months (around 05/24/2016) for re-evaluation of diabetes and FASTING labs.    Fara Chute, PA-C Physician Assistant-Certified [facility]

## 2016-02-23 LAB — LIPID PANEL
CHOL/HDL RATIO: 4.1 ratio (ref <=5.0)
CHOLESTEROL: 165 mg/dL (ref 125–200)
HDL: 40 mg/dL (ref >=40)
LDL Cholesterol: 100 mg/dL (ref <130)
TRIGLYCERIDES: 125 mg/dL (ref <150)
VLDL: 25 mg/dL (ref <30)

## 2016-02-23 LAB — COMPREHENSIVE METABOLIC PANEL
ALBUMIN: 4.5 g/dL (ref 3.6–5.1)
ALT: 18 U/L (ref 9–46)
AST: 17 U/L (ref 10–35)
Alkaline Phosphatase: 81 U/L (ref 40–115)
BUN: 15 mg/dL (ref 7–25)
CHLORIDE: 104 mmol/L (ref 98–110)
CO2: 23 mmol/L (ref 20–31)
CREATININE: 1.01 mg/dL (ref 0.70–1.25)
Calcium: 9.5 mg/dL (ref 8.6–10.3)
Glucose, Bld: 104 mg/dL — ABNORMAL HIGH (ref 65–99)
POTASSIUM: 4.5 mmol/L (ref 3.5–5.3)
Sodium: 139 mmol/L (ref 135–146)
TOTAL PROTEIN: 7.4 g/dL (ref 6.1–8.1)
Total Bilirubin: 0.7 mg/dL (ref 0.2–1.2)

## 2016-02-29 ENCOUNTER — Ambulatory Visit: Payer: 59 | Admitting: Physician Assistant

## 2016-03-17 ENCOUNTER — Other Ambulatory Visit: Payer: Self-pay | Admitting: Physician Assistant

## 2016-03-17 DIAGNOSIS — H9313 Tinnitus, bilateral: Secondary | ICD-10-CM

## 2016-03-20 NOTE — Telephone Encounter (Signed)
Meds ordered this encounter  Medications  . diazepam (VALIUM) 10 MG tablet    Sig: TAKE 1 TABLET BY MOUTH EVERY 6 HOURS AS NEEDED FOR ANXIETY    Dispense:  30 tablet    Refill:  0    Not to exceed 5 additional fills before 08/20/2016.

## 2016-03-20 NOTE — Telephone Encounter (Signed)
This was faxed earlier. Sent pt a mychart message to notify

## 2016-03-20 NOTE — Telephone Encounter (Signed)
Faxed

## 2016-04-17 ENCOUNTER — Other Ambulatory Visit: Payer: Self-pay | Admitting: Physician Assistant

## 2016-04-17 DIAGNOSIS — H9313 Tinnitus, bilateral: Secondary | ICD-10-CM

## 2016-04-17 MED ORDER — DIAZEPAM 10 MG PO TABS: 10.0000 mg | ORAL_TABLET | Freq: Four times a day (QID) | ORAL | 0 refills | Status: DC | PRN

## 2016-04-17 NOTE — Telephone Encounter (Signed)
Last OV for Tinnitus 8/29, last RF 9/25. Pended.

## 2016-04-17 NOTE — Telephone Encounter (Signed)
Meds ordered this encounter  Medications  . diazepam (VALIUM) 10 MG tablet    Sig: Take 1 tablet (10 mg total) by mouth every 6 (six) hours as needed. for anxiety    Dispense:  30 tablet    Refill:  0    Not to exceed 5 additional fills before 08/20/2016.

## 2016-04-18 ENCOUNTER — Other Ambulatory Visit: Payer: Self-pay | Admitting: Physician Assistant

## 2016-04-18 DIAGNOSIS — H9313 Tinnitus, bilateral: Secondary | ICD-10-CM

## 2016-04-18 NOTE — Telephone Encounter (Signed)
Called to pharmacy 

## 2016-04-19 ENCOUNTER — Other Ambulatory Visit: Payer: Self-pay | Admitting: Physician Assistant

## 2016-04-25 ENCOUNTER — Ambulatory Visit: Payer: 59 | Admitting: Physician Assistant

## 2016-05-23 ENCOUNTER — Other Ambulatory Visit: Payer: Self-pay | Admitting: Physician Assistant

## 2016-05-23 ENCOUNTER — Ambulatory Visit: Payer: 59 | Admitting: Physician Assistant

## 2016-05-23 DIAGNOSIS — H9313 Tinnitus, bilateral: Secondary | ICD-10-CM

## 2016-05-24 ENCOUNTER — Other Ambulatory Visit: Payer: Self-pay | Admitting: Physician Assistant

## 2016-05-27 NOTE — Telephone Encounter (Signed)
01/2016 last ov and refill #90 on 04/19/16

## 2016-05-28 ENCOUNTER — Other Ambulatory Visit: Payer: Self-pay | Admitting: Physician Assistant

## 2016-05-28 DIAGNOSIS — H9313 Tinnitus, bilateral: Secondary | ICD-10-CM

## 2016-05-28 NOTE — Telephone Encounter (Signed)
04/17/16 last refill 01/2016 last ov

## 2016-05-29 MED ORDER — DIAZEPAM 10 MG PO TABS: 10.0000 mg | ORAL_TABLET | Freq: Four times a day (QID) | ORAL | 0 refills | Status: DC | PRN

## 2016-05-29 NOTE — Telephone Encounter (Signed)
Meds ordered this encounter  Medications  . diazepam (VALIUM) 10 MG tablet    Sig: Take 1 tablet (10 mg total) by mouth every 6 (six) hours as needed. for anxiety    Dispense:  30 tablet    Refill:  0    Not to exceed 5 additional fills before 08/20/2016.

## 2016-05-29 NOTE — Telephone Encounter (Signed)
Called to pharm 

## 2016-05-30 ENCOUNTER — Ambulatory Visit (INDEPENDENT_AMBULATORY_CARE_PROVIDER_SITE_OTHER): Payer: 59 | Admitting: Family Medicine

## 2016-05-30 ENCOUNTER — Encounter: Payer: Self-pay | Admitting: Physician Assistant

## 2016-05-30 ENCOUNTER — Ambulatory Visit (INDEPENDENT_AMBULATORY_CARE_PROVIDER_SITE_OTHER): Payer: 59 | Admitting: Physician Assistant

## 2016-05-30 VITALS — BP 124/86 | HR 72 | Ht 69.5 in | Wt 264.0 lb

## 2016-05-30 VITALS — BP 116/80 | HR 152 | Temp 97.6°F | Resp 17 | Ht 70.0 in | Wt 262.0 lb

## 2016-05-30 DIAGNOSIS — I4892 Unspecified atrial flutter: Secondary | ICD-10-CM

## 2016-05-30 DIAGNOSIS — L989 Disorder of the skin and subcutaneous tissue, unspecified: Secondary | ICD-10-CM | POA: Diagnosis not present

## 2016-05-30 DIAGNOSIS — G4733 Obstructive sleep apnea (adult) (pediatric): Secondary | ICD-10-CM | POA: Diagnosis not present

## 2016-05-30 DIAGNOSIS — E782 Mixed hyperlipidemia: Secondary | ICD-10-CM

## 2016-05-30 DIAGNOSIS — E6609 Other obesity due to excess calories: Secondary | ICD-10-CM

## 2016-05-30 DIAGNOSIS — R0789 Other chest pain: Secondary | ICD-10-CM | POA: Diagnosis not present

## 2016-05-30 DIAGNOSIS — E669 Obesity, unspecified: Secondary | ICD-10-CM

## 2016-05-30 DIAGNOSIS — Z6837 Body mass index (BMI) 37.0-37.9, adult: Secondary | ICD-10-CM | POA: Diagnosis not present

## 2016-05-30 DIAGNOSIS — IMO0001 Reserved for inherently not codable concepts without codable children: Secondary | ICD-10-CM

## 2016-05-30 DIAGNOSIS — Z85820 Personal history of malignant melanoma of skin: Secondary | ICD-10-CM | POA: Diagnosis not present

## 2016-05-30 DIAGNOSIS — I480 Paroxysmal atrial fibrillation: Secondary | ICD-10-CM

## 2016-05-30 MED ORDER — METOPROLOL TARTRATE 50 MG PO TABS: 50.0000 mg | ORAL_TABLET | Freq: Two times a day (BID) | ORAL | 3 refills | Status: DC

## 2016-05-30 MED ORDER — RIVAROXABAN 20 MG PO TABS: 20.0000 mg | ORAL_TABLET | Freq: Every day | ORAL | 2 refills | Status: DC

## 2016-05-30 NOTE — Patient Instructions (Addendum)
  Follow up immediately with Cardiology over at [address] at [employer]  If you have difficulty finding it call your Cardiologist Dr. [name]   IF you received an x-ray today, you will receive an invoice from [facility]. Please contact [facility] at [phone] with questions or concerns regarding your invoice.   IF you received labwork today, you will receive an invoice from [employer]. Please contact [facility] at [phone] with questions or concerns regarding your invoice.   Our billing staff will not be able to assist you with questions regarding bills from these companies.  You will be contacted with the lab results as soon as they are available. The fastest way to get your results is to activate your My Chart account. Instructions are located on the last page of this paperwork. If you have not heard from [place] regarding the results in 2 weeks, please contact this office.

## 2016-05-30 NOTE — Progress Notes (Signed)
Chief Complaint  Patient presents with  . Follow-up    Biopsy a mass on shoulder.     HPI  Heart Palpitations A. Fib Pt reports that this morning he can feel his atrial fibrillation.  States that it started 2 hours ago and feels like a squeeze in his heart.  He reports that he felt a little nauseous.  He feels like his pulse is fast as well. He took his metoprolol. He reports that he is not dehydrated as he drank water this morning.   He reports that he was a little stressed this morning because he was waiting for his appointment. He has only had water this morning and took 75mg  of metoprolol.  Obesity He reports that he has not been exercising and knows he is 30 pounds overweight. He plans to start cutting back on his portions.  He denies any diet supplements. Wt Readings from Last 3 Encounters:  05/30/16 262 lb (118.8 kg)  02/22/16 263 lb (119.3 kg)  12/23/15 260 lb 6.4 oz (118.1 kg)    Skin Lesion He is here today for a mass on the shoulder.  The mass is actually too very itchy skin lesions and he is concerned about skin cancer.  He had a history of melanoma.   Past Medical History:  Diagnosis Date  . Allergy   . Arthritis    bilateral thumbs  . BCC (basal cell carcinoma of skin) 04/2006   LEFT low back  . Cardiac murmur 1996   normal ECHO  . Diabetes mellitus without complication ([place])   . Diverticulosis   . ED (erectile dysfunction) 2006  . H/O ETOH abuse    Quit 2006  . Hernia    RIGHT inguinal  . Hyperlipidemia   . Macular degeneration   . PAF (paroxysmal atrial fibrillation) ([place]) 04/15/2015   During exercise myoview  . Tinnitus of both ears   . Ulcer [facility])     Current Outpatient Prescriptions  Medication Sig Dispense Refill  . azelastine (ASTELIN) 0.1 % nasal spray Place 1 spray into both nostrils 2 (two) times daily as needed for rhinitis. Use in each nostril as directed    . cholecalciferol (VITAMIN D) 1000 UNITS tablet Take 1,000 Units by mouth  daily.    . diazepam (VALIUM) 10 MG tablet Take 1 tablet (10 mg total) by mouth every 6 (six) hours as needed. for anxiety 30 tablet 0  . ezetimibe-simvastatin (VYTORIN) 10-40 MG tablet Take 1 tablet by mouth at bedtime. 90 tablet 1  . fluticasone (FLONASE) 50 MCG/ACT nasal spray Place 2 sprays into both nostrils daily. 16 g 12  . guaiFENesin (MUCINEX) 600 MG 12 hr tablet Take 600 mg by mouth 2 (two) times daily as needed for cough or to loosen phlegm.    . metoprolol tartrate (LOPRESSOR) 25 MG tablet TAKE 75 MG BY MOUTH IN THE AM & 25 MG BY MOUTH IN THE PM    . Omega-3 Fatty Acids (FISH OIL) 1000 MG CAPS Take 12,000 mg by mouth daily. Reported on 10/08/2015    . ibuprofen (ADVIL,MOTRIN) 800 MG tablet TAKE 1 TABLET BY MOUTH EVERY 8 HOURS AS NEEDED (Patient not taking: Reported on 05/30/2016) 90 tablet 0   No current facility-administered medications for this visit.     Allergies:  Allergies  Allergen Reactions  . Codeine Other (See Comments)    Dizziness; faint  . Hydrocodone-Acetaminophen     Unknown per pt   . Methocarbamol Other (See Comments)  HA, tinnitus  . Morphine And Related Other (See Comments)    Severe Headache  . Nsaids     BRBPR    Past Surgical History:  Procedure Laterality Date  . GANGLION CYST EXCISION  1970's  . LUMBAR FUSION     L3-4; Dr. Ellene Route  . SPINE SURGERY    . THUMB FUSION     BILATERAL  . VASECTOMY  1976    Social History   Social History  . Marital status: Married    Spouse name: [name]  . Number of children: 2  . Years of education: 8   Occupational History  . Retired Retired    Careers information officer   Social History Main Topics  . Smoking status: Former Research scientist (life sciences)  . Smokeless tobacco: Former Systems developer    Types: Chew     Comment: some days  . Alcohol use No  . Drug use: No  . Sexual activity: Not Currently    Partners: Female     Comment: family stress; his back pain have reduced the frequency   Other Topics Concern  . None    Social History Narrative   Married to 3rd wife.    Review of Systems  Constitutional: Negative for chills, diaphoresis, fever, malaise/fatigue and weight loss.  Cardiovascular: Positive for palpitations. Negative for chest pain, orthopnea, leg swelling and PND.  Gastrointestinal: Positive for nausea. Negative for vomiting.  Skin:       Skin lesion on right shoulder   Neurological: Negative for weakness.    Objective: Vitals:   05/30/16 0753  BP: 116/80  Pulse: (!) 152  Resp: 17  Temp: 97.6 F (36.4 C)  TempSrc: Oral  SpO2: 97%  Weight: 262 lb (118.8 kg)  Height: 5\' 10"  (1.778 m)   Body mass index is 37.59 kg/m.  Physical Exam  Constitutional: He is oriented to person, place, and time. He appears well-developed and well-nourished.  HENT:  Head: Normocephalic and atraumatic.  Right Ear: External ear normal.  Left Ear: External ear normal.  Nose: Nose normal.  Mouth/Throat: Oropharynx is clear and moist.  Eyes: Conjunctivae and EOM are normal.  Neck: Normal range of motion. Neck supple.  Cardiovascular: Normal pulses.  An irregularly irregular rhythm present.  Pulmonary/Chest: Effort normal and breath sounds normal. No respiratory distress. He has no wheezes.  Musculoskeletal: Normal range of motion. He exhibits no edema.  Neurological: He is alert and oriented to person, place, and time.  Skin: Skin is warm. Capillary refill takes less than 2 seconds. No erythema.       Assessment and Plan Jarvin was seen today for follow-up.  Diagnoses and all orders for this visit:  PAF (paroxysmal atrial fibrillation) ([place])- due to history of paroxysmal afib pt is at increase risk of other electrical disorders of the heart Today he has new onset of flutter Discussed with him to follow up with Cardiology right away.  Released pt to go to Cardiology where they are waiting on him for an acute appt  Pt is hemodynamically stable and can drive -     EKG [id number]  Skin lesion-  unable to perform dermatology pathology today Pt to follow up with Dermatology asap or return for biopsy after his heart condition has stabilized -     Dermatology pathology -     Ambulatory referral to Dermatology  History of melanoma- cancelled biopsy today as pt is getting increasingly anxious about his heart condition  Chest pressure- advised to follow up with Cardiology  Atrial flutter, unspecified type ([place]) Independent ECG review shows atrial flutter Ventricular rate of 76 Discussed with Dr. [name] his Cardiologist Who advised that pt be seen by a Physician Extender right away for a new onset of atrial flutter  Class 2 obesity due to excess calories with serious comorbidity and body mass index (BMI) of 37.0 to 37.9 in adult- discussed heart healthy diet and a slow and steady weight loss Exercise program will be determined by Cardiology   A total of 40 minutes were spent face-to-face with the patient during this encounter and over half of that time was spent on counseling and coordination of care.   [place]

## 2016-05-30 NOTE — Progress Notes (Signed)
Cardiology Office Note    Date:  05/30/2016   ID:  Omani Lanton Gassmann, DOB Jan 05, 1956, MRN [id number]  PCP:  [name], PA-C  Cardiologist:  Dr. [name]  Chief Complaint: aflutter/afib  History of Present Illness:   Craig Roberson is a 60 y.o. male with hx of PAF, mild OSA, HLD and DM (diet controlled - A1c was 6.8 on 02/02/16) who added to schedule for aflutter.   Low risk Myoview 03/2015, had episode of afib during test. F/u monitor showed PAF. BB adjusted.  Echo 03/2015 showed LV EF of 55-60%, basal hypertrophy of the septum.   The patient noted to be in aflutter at PCP office today and added to my schedule. He has been having palpitation as "something wrong with chest sensation" since 6AM  today. She is drinking 2-3 cups of caffeinated coffee and 2-3 12 OZ of soda everyday. He has noted intermittent palpitation multiple times in week lasting to for minutes to hours. Noted association with caffeine intake. admittes intermittent LE edema. No associated dyspnea, dizziness, syncope. Denies chest pain, melena, orthopnea, PND. Some what non-compliant with diet. Complains of muscle ache on Vytorin.    Past Medical History:  Diagnosis Date  . Allergy   . Arthritis    bilateral thumbs  . BCC (basal cell carcinoma of skin) 04/2006   LEFT low back  . Cardiac murmur 1996   normal ECHO  . Diabetes mellitus without complication ([place])   . Diverticulosis   . ED (erectile dysfunction) 2006  . H/O ETOH abuse    Quit 2006  . Hernia    RIGHT inguinal  . Hyperlipidemia   . Macular degeneration   . PAF (paroxysmal atrial fibrillation) ([place]) 04/15/2015   During exercise myoview  . Tinnitus of both ears   . Ulcer [facility])     Past Surgical History:  Procedure Laterality Date  . GANGLION CYST EXCISION  1970's  . LUMBAR FUSION     L3-4; Dr. Ellene Route  . SPINE SURGERY    . THUMB FUSION     BILATERAL  . VASECTOMY  1976    Current Medications: Prior to Admission medications     Medication Sig Start Date End Date Taking? Authorizing Provider  azelastine (ASTELIN) 0.1 % nasal spray Place 1 spray into both nostrils 2 (two) times daily as needed for rhinitis. Use in each nostril as directed    Historical Provider, MD  cholecalciferol (VITAMIN D) 1000 UNITS tablet Take 1,000 Units by mouth daily.    Historical Provider, MD  diazepam (VALIUM) 10 MG tablet Take 1 tablet (10 mg total) by mouth every 6 (six) hours as needed. for anxiety 05/29/16   [name], PA-C  ezetimibe-simvastatin (VYTORIN) 10-40 MG tablet Take 1 tablet by mouth at bedtime. 11/18/15   [name], PA-C  fluticasone (FLONASE) 50 MCG/ACT nasal spray Place 2 sprays into both nostrils daily. 10/05/15   [name], PA-C  guaiFENesin (MUCINEX) 600 MG 12 hr tablet Take 600 mg by mouth 2 (two) times daily as needed for cough or to loosen phlegm.    Historical Provider, MD  ibuprofen (ADVIL,MOTRIN) 800 MG tablet TAKE 1 TABLET BY MOUTH EVERY 8 HOURS AS NEEDED Patient not taking: Reported on 05/30/2016 05/27/16   [name], PA-C  metoprolol tartrate (LOPRESSOR) 25 MG tablet TAKE 75 MG BY MOUTH IN THE AM & 25 MG BY MOUTH IN THE PM    Historical Provider, MD  Omega-3 Fatty Acids (FISH OIL) 1000 MG CAPS Take  12,000 mg by mouth daily. Reported on 10/08/2015    Historical Provider, MD    Allergies:   Codeine; Hydrocodone-acetaminophen; Methocarbamol; Morphine and related; and Nsaids   Social History   Social History  . Marital status: Married    Spouse name: [name]  . Number of children: 2  . Years of education: 8   Occupational History  . Retired Retired    Careers information officer   Social History Main Topics  . Smoking status: Former Research scientist (life sciences)  . Smokeless tobacco: Former Systems developer    Types: Chew     Comment: some days  . Alcohol use No  . Drug use: No  . Sexual activity: Not Currently    Partners: Female     Comment: family stress; his back pain have reduced the frequency   Other Topics Concern   . None   Social History Narrative   Married to 3rd wife.     Family History:  The patient's family history includes Atrial fibrillation in his brother; Cancer in his father; Heart attack in his father; Heart disease (age of onset: 61) in his father; Heart disease (age of onset: 6) in his brother; Hyperlipidemia in his mother; Hypertension in his brother; Stroke in his father; Stroke (age of onset: 84) in his son; Vascular Disease in his brother.   ROS:   Please see the history of present illness.    ROS All other systems reviewed and are negative.   PHYSICAL EXAM:   VS:  BP 124/86   Pulse 72   Ht 5' 9.5" (1.765 m)   Wt 264 lb (119.7 kg)   SpO2 97%   BMI 38.43 kg/m    GEN: Well nourished, well developed, in no acute distress  HEENT: normal  Neck: no JVD, carotid bruits, or masses Cardiac: RRR; no murmurs, rubs, or gallops. Trace LE edema  Respiratory:  clear to auscultation bilaterally, normal work of breathing GI: soft, nontender, nondistended, + BS MS: no deformity or atrophy  Skin: warm and dry, no rash Neuro:  Alert and Oriented x 3, Strength and sensation are intact Psych: euthymic mood, full affect  Wt Readings from Last 3 Encounters:  05/30/16 264 lb (119.7 kg)  05/30/16 262 lb (118.8 kg)  02/22/16 263 lb (119.3 kg)      Studies/Labs Reviewed:   EKG:  EKG is not ordered today.  The ekg done (at PCP office)  today demonstrates aflutter at rate of 76 bpm.  Recent Labs: 08/23/2015: TSH 1.75 02/22/2016: ALT 18; BUN 15; Creat 1.01; Potassium 4.5; Sodium 139   Lipid Panel    Component Value Date/Time   CHOL 165 02/22/2016 1237   TRIG 125 02/22/2016 1237   HDL 40 02/22/2016 1237   CHOLHDL 4.1 02/22/2016 1237   VLDL 25 02/22/2016 1237   LDLCALC 100 02/22/2016 1237    Additional studies/ records that were reviewed today include:  Myoview 04/15/2015  Nuclear stress EF: 59%.  The study is normal.  This is a low risk study.  The left ventricular ejection  fraction is normal (55-65%).   Normal stress perfusion images Resting images poor quality with low counts and artifact EF 59% no RWMA;s Patient developed prolonged episode of PAF with fast rates and some ST changes lasting about 20 minutes Rx with iv cardizem and lopressor  Nursing notes indicate conversion to NSR but last strips provided for reading showed afib still  Monitor 04/15/15  Normal sinus rhythm, sinus bradycardia and sinus tachycardia with heart rate ranging  from 50 to 160 bpm.  Paroxysmal atrial fibrillation up to 160bpm.  Occasional PACs and PVCs.  Echocardiogram: 04/22/2015 LV EF: 55% -   60%  ------------------------------------------------------------------- Indications:      Palpitation (R00.2).  ------------------------------------------------------------------- History:   PMH:   Murmur.  Atrial fibrillation.  Risk factors: Diverticulosis. DJD. DDD. Macular degeneration. Hernia. Family history of coronary artery disease. Former tobacco use. Diabetes mellitus. Obese. Dyslipidemia.  ------------------------------------------------------------------- Study Conclusions  - Left ventricle: The cavity size was normal. There was mild focal   basal hypertrophy of the septum. Systolic function was normal.   The estimated ejection fraction was in the range of 55% to 60%.   Wall motion was normal; there were no regional wall motion   abnormalities. Left ventricular diastolic function parameters   were normal. - Right ventricle: The cavity size was normal. Wall thickness was   normal. Systolic function was normal. - Atrial septum: No defect or patent foramen ovale was identified. - Tricuspid valve: There was no regurgitation. - Pulmonic valve: There was trivial regurgitation. - Inferior vena cava: The vessel was normal in size. The   respirophasic diameter changes were in the normal range (>= 50%),   consistent with normal central venous  pressure.    ASSESSMENT & PLAN:    1. Atrial flutter - Hx of PAF with intermittent palpitations. He noted irregular heart rhythm since 6 AM today. Noted in aflutter at PCP office (went there for skin lesion) at rate of 76 bpm. No associated symptoms. Marland Kitchen His CHADSVASCs score is 1 for DM. He takes lopressor 75mg  in AM and PRN 25mg  in PM. Likely his symptoms for excess caffeine intake. Advice to stop completely. Stop Ibuprofen. Start Eldridge Abrahams for anticoagulation. Recheck in 4 weeks, if still in afib/aflutter will plan for DCCV at that time. Long term anticoagulation per Dr. [name] at that time.   2. HLD - Continue Vytorin for now. IF still having muscle ache during next OV, might consider discontinuation.   3. Morbid obesity - Estimated body mass index is 38.43 kg/m as calculated from the following:   Height as of this encounter: 5' 9.5" (1.765 m).   Weight as of this encounter: 264 lb (119.7 kg).  Advised regular exercise and healthy diet. Loose weight.  4. LE Edema - Mild. Advised to cut back on salt.     Medication Adjustments/Labs and Tests Ordered: Current medicines are reviewed at length with the patient today.  Concerns regarding medicines are outlined above.  Medication changes, Labs and Tests ordered today are listed in the Patient Instructions below. Patient Instructions  Medication Instructions:   STOP TAKING IBUPROFEN NOW  START TAKING XARELTO 20 MG ONCE DAILY AT SUPPER TIME  INCREASE YOUR METOPROLOL TARTRATE (LOPRESSOR) TO 50 MG TWICE DAILY    Follow-Up:  IN 4 WEEKS WITH AN EXTENDER ON DR TURNER'S TEAM, FOR FOLLOW-UP AND ARRANGE FOR CARDIOVERSION     --PLEASE DECREASE YOUR CAFFEINE INTAKE, AND STAY HYDRATED WITH WATER--  ---PLEASE AVOID ALCOHOL--     If you need a refill on your cardiac medications before your next appointment, please call your pharmacy.      Jarrett Soho, [place]  05/30/2016 11:21 AM    [facility]  [facility], [place], [place]  [address] Phone: [phone]; Fax: [phone]

## 2016-05-30 NOTE — Patient Instructions (Signed)
Medication Instructions:   STOP TAKING IBUPROFEN NOW  START TAKING XARELTO 20 MG ONCE DAILY AT SUPPER TIME  INCREASE YOUR METOPROLOL TARTRATE (LOPRESSOR) TO 50 MG TWICE DAILY    Follow-Up:  IN 4 WEEKS WITH AN EXTENDER ON DR TURNER'S TEAM, FOR FOLLOW-UP AND ARRANGE FOR CARDIOVERSION     --PLEASE DECREASE YOUR CAFFEINE INTAKE, AND STAY HYDRATED WITH WATER--  ---PLEASE AVOID ALCOHOL--     If you need a refill on your cardiac medications before your next appointment, please call your pharmacy.

## 2016-06-13 ENCOUNTER — Other Ambulatory Visit: Payer: Self-pay | Admitting: Physician Assistant

## 2016-06-13 DIAGNOSIS — E782 Mixed hyperlipidemia: Secondary | ICD-10-CM

## 2016-06-15 NOTE — Telephone Encounter (Signed)
01/2016 last ov and labs  

## 2016-06-27 ENCOUNTER — Ambulatory Visit (INDEPENDENT_AMBULATORY_CARE_PROVIDER_SITE_OTHER): Payer: 59 | Admitting: Cardiology

## 2016-06-27 ENCOUNTER — Encounter: Payer: Self-pay | Admitting: Cardiology

## 2016-06-27 VITALS — BP 118/80 | HR 68 | Ht 69.5 in | Wt 261.0 lb

## 2016-06-27 DIAGNOSIS — I480 Paroxysmal atrial fibrillation: Secondary | ICD-10-CM

## 2016-06-27 MED ORDER — FLECAINIDE ACETATE 50 MG PO TABS: 50.0000 mg | ORAL_TABLET | Freq: Two times a day (BID) | ORAL | 3 refills | Status: DC

## 2016-06-27 NOTE — Progress Notes (Signed)
06/27/2016 Craig Roberson   1955/12/22  [id number]  Primary Physician [name], PA-C Primary Cardiologist: Dr. [name]  Reason for Visit/CC: Paroxsymal Atrial  Fibrillation/ Flutter  HPI:  The patient is a 61 y/o male, followed by Dr. [name] with a h/o paroxsymal atrial fibrillation/flutter mild OSA, HLD and DM (diet controlled - A1c was 6.8 on 02/02/16), who presents back to clinic for 4 week f/u. He was seen recently, in our clinic, on 05/30/16, after being sent from his PCP's office for recurrent symptomatic atrial flutter. This was in the setting of moderate-heavy caffeine consumption. He was seen by Gilberto Better, PA-C. EKG confirmed atrial flutter. He was started on Xarelto and his metoprolol was increased to 50 mg BID. He was advised to restrict caffeine and to f/u in 4 weeks for repeat EKG and to plan for elective DCCV if still out of rhythm.  EKG today shows NSR with rate of 63 bpm. Qt/QTc is 39/399 ms. He reports full, uninterrupted compliance with Sheila Oats. Also compliant with metoprolol and has changed over to decaffeinated coffee. Despite currently, being in NSR, he report frequent paroxsymal, breakthrough atrial fibrillation/ tachy palpitations. This occurs almost daily and he is symptomatic when this happens.   Current Meds  Medication Sig  . azelastine (ASTELIN) 0.1 % nasal spray Place 1 spray into both nostrils 2 (two) times daily as needed for rhinitis. Use in each nostril as directed  . cholecalciferol (VITAMIN D) 1000 UNITS tablet Take 1,000 Units by mouth daily.  . diazepam (VALIUM) 10 MG tablet Take 1 tablet (10 mg total) by mouth every 6 (six) hours as needed. for anxiety  . ezetimibe-simvastatin (VYTORIN) 10-40 MG tablet TAKE 1 TABLET BY MOUTH AT BEDTIME.  . fluticasone (FLONASE) 50 MCG/ACT nasal spray Place 2 sprays into both nostrils daily.  Marland Kitchen guaiFENesin (MUCINEX) 600 MG 12 hr tablet Take 600 mg by mouth 2 (two) times daily as needed for cough or to loosen  phlegm.  . metoprolol (LOPRESSOR) 50 MG tablet Take 1 tablet (50 mg total) by mouth 2 (two) times daily.  . Omega-3 Fatty Acids (FISH OIL) 1000 MG CAPS Take 12,000 mg by mouth daily. Reported on 10/08/2015  . rivaroxaban (XARELTO) 20 MG TABS tablet Take 1 tablet (20 mg total) by mouth daily with supper.   Allergies  Allergen Reactions  . Codeine Other (See Comments)    Dizziness; faint  . Hydrocodone-Acetaminophen     Unknown per pt   . Methocarbamol Other (See Comments)    HA, tinnitus  . Morphine And Related Other (See Comments)    Severe Headache  . Nsaids     BRBPR   Past Medical History:  Diagnosis Date  . Allergy   . Arthritis    bilateral thumbs  . BCC (basal cell carcinoma of skin) 04/2006   LEFT low back  . Cardiac murmur 1996   normal ECHO  . Diabetes mellitus without complication ([place])   . Diverticulosis   . ED (erectile dysfunction) 2006  . H/O ETOH abuse    Quit 2006  . Hernia    RIGHT inguinal  . Hyperlipidemia   . Macular degeneration   . PAF (paroxysmal atrial fibrillation) ([place]) 04/15/2015   During exercise myoview  . Tinnitus of both ears   . Ulcer ([place])    Family History  Problem Relation Age of Onset  . Heart disease Brother 53    AMI, normal weight, normal cholesterol  . Heart disease Father 36  . Cancer  Father     Prostate  . Stroke Father   . Heart attack Father   . Stroke Son 59  . Hyperlipidemia Mother   . Atrial fibrillation Brother   . Vascular Disease Brother     carotid artery disease  . Hypertension Brother    Past Surgical History:  Procedure Laterality Date  . GANGLION CYST EXCISION  1970's  . LUMBAR FUSION     L3-4; Dr. Ellene Route  . SPINE SURGERY    . THUMB FUSION     BILATERAL  . VASECTOMY  1976   Social History   Social History  . Marital status: Married    Spouse name: [name]  . Number of children: 2  . Years of education: 8   Occupational History  . Retired Retired    Careers information officer   Social  History Main Topics  . Smoking status: Former Research scientist (life sciences)  . Smokeless tobacco: Former Systems developer    Types: Chew     Comment: some days  . Alcohol use No  . Drug use: No  . Sexual activity: Not Currently    Partners: Female     Comment: family stress; his back pain have reduced the frequency   Other Topics Concern  . Not on file   Social History Narrative   Married to 3rd wife.     Review of Systems: General: negative for chills, fever, night sweats or weight changes.  Cardiovascular: negative for chest pain, dyspnea on exertion, edema, orthopnea, palpitations, paroxysmal nocturnal dyspnea or shortness of breath Dermatological: negative for rash Respiratory: negative for cough or wheezing Urologic: negative for hematuria Abdominal: negative for nausea, vomiting, diarrhea, bright red blood per rectum, melena, or hematemesis Neurologic: negative for visual changes, syncope, or dizziness All other systems reviewed and are otherwise negative except as noted above.   Physical Exam:  Blood pressure 118/80, pulse 68, height 5' 9.5" (1.765 m), weight 261 lb (118.4 kg).  General appearance: alert, cooperative and no distress Neck: no carotid bruit and no JVD Lungs: clear to auscultation bilaterally Heart: regular rate and rhythm, S1, S2 normal, no murmur, click, rub or gallop Extremities: extremities normal, atraumatic, no cyanosis or edema Pulses: 2+ and symmetric Skin: Skin color, texture, turgor normal. No rashes or lesions Neurologic: Grossly normal  EKG NSR. 63 bpm. QT/QTc 390/399 ms.   ASSESSMENT AND PLAN:   1. Paroxsymal Atrial Flutter/Fibrillation: Pt has failed rate control therapy. He continues to have breakthrough atrial afib/flutter on a daily basis and is symptomatic. EKG currently shows NSR with HR of 63 bpm and stable QT/QTc of 390/399 ms. He has no documented h/o CAD nor systolic HF. NST in [id number] was low risk and 2D echo showed normal LVF.  Discussed with Dr. Curt Bears, DOD. We  will add Flecainide 50 mg BID. Continue metoprolol 50 mg BID + Xareleto for a/c. He will need a ETT in 7 days for medication monitoring. We will check a BMP, Mg and TSH today. We disccused further avoidance of caffeine. F/u with Dr. [name] or myself in 1-2 week.   Lyda Jester PA-C 06/27/2016 9:24 AM

## 2016-06-27 NOTE — Patient Instructions (Addendum)
Medication Instructions:  1. START FLECAINIDE 50 MG TWICE DAILY; RX HAS BEEN SENT IN  2. MAKE SURE TO CONTINUE METOPROLOL 50 MG TWICE DAILY  3. MAKE SURE TO CONTINUE THE XARELTO 20 MG DAILY AT SUPPER TIME  Labwork: TODAY BMET, MAGNESIUM LEVEL, TSH  Testing/Procedures: 1. Your physician has requested that you have an exercise tolerance test. For further information please visit [email or web address]. Please also follow instruction sheet, as given. THIS WILL NEED TO BE IN 1 WEEK; NEW START FLECAINIDE    Follow-Up: 2 WEEK FOLLOW UP WITH BRITTANY SIMMONS, PA OR DR. [name]  Any Other Special Instructions Will Be Listed Below (If Applicable).     If you need a refill on your cardiac medications before your next appointment, please call your pharmacy.

## 2016-06-28 IMAGING — CT CT HEART SCORING
1 of 3 series · 10 of 20 positions shown, 13 images · non-contrast
Comparison: None.

CLINICAL DATA: Risk stratification

EXAM:
Coronary Calcium Score
TECHNIQUE: The patient was scanned on a Siemens Sensation 16 slice scanner.
Axial non-contrast 3 mm slices were carried out through the heart.
The data set was analyzed on a dedicated work station and scored
using the Agatson method.

[Series 6: st thins for reformat · axial · 0.75mm/px · z∈[-254,-155]mm · 10 of 123 slices shown, 13 images]
[im 12/123  vessel]
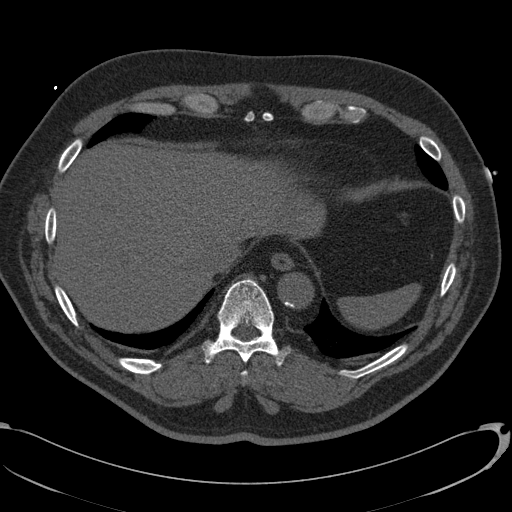
[im 12/123  lung]
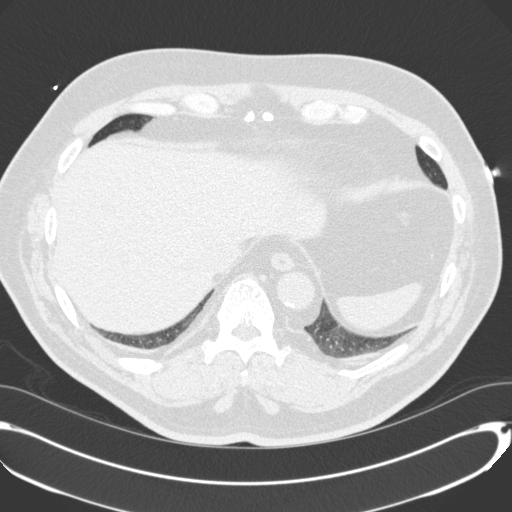
[im 23/123  vessel]
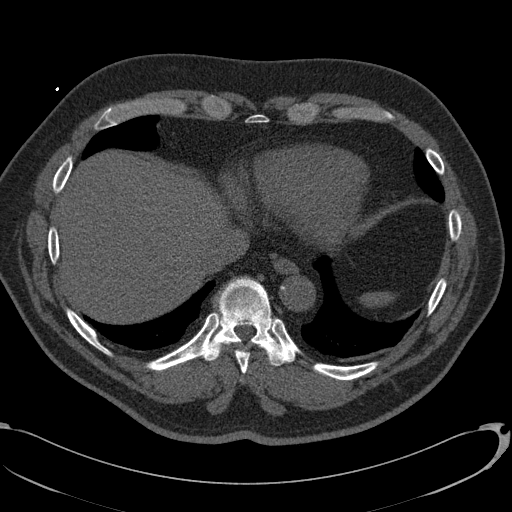
[im 34/123  vessel]
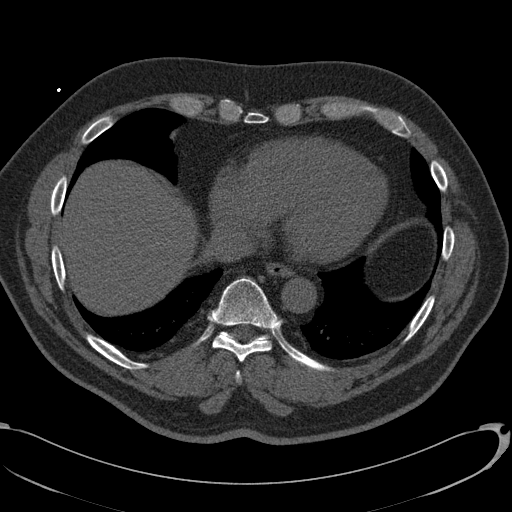
[im 45/123  vessel]
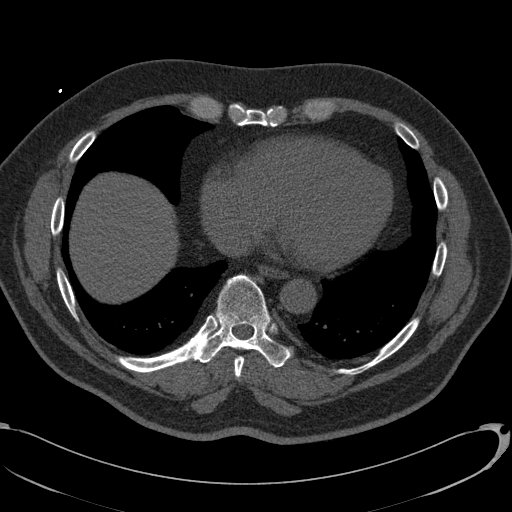
[im 56/123  vessel]
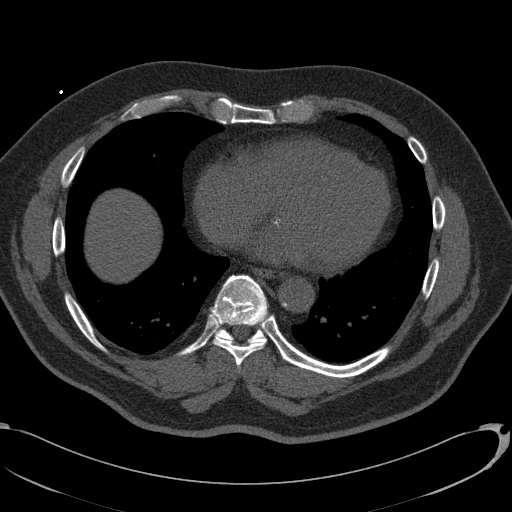
[im 56/123  lung]
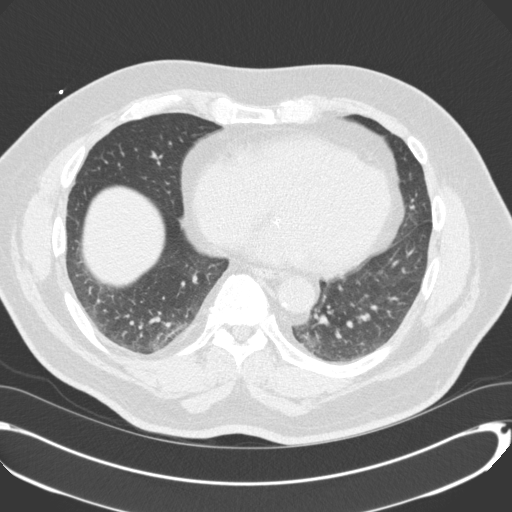
[im 67/123  vessel]
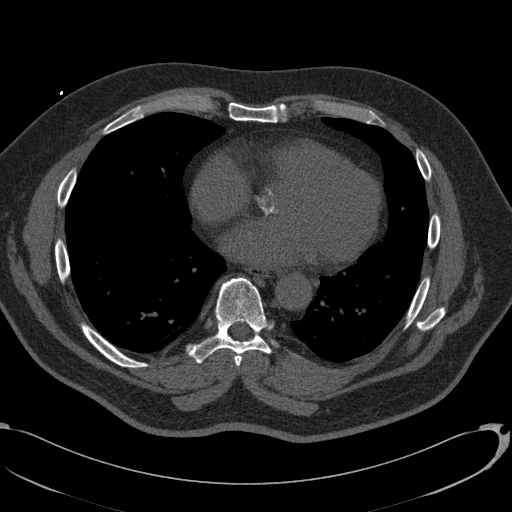
[im 78/123  vessel]
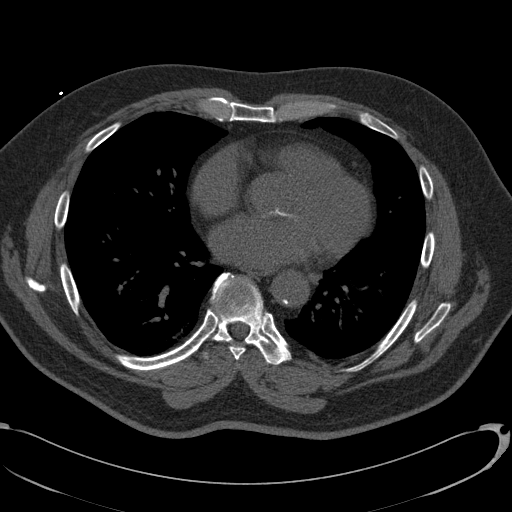
[im 89/123  vessel]
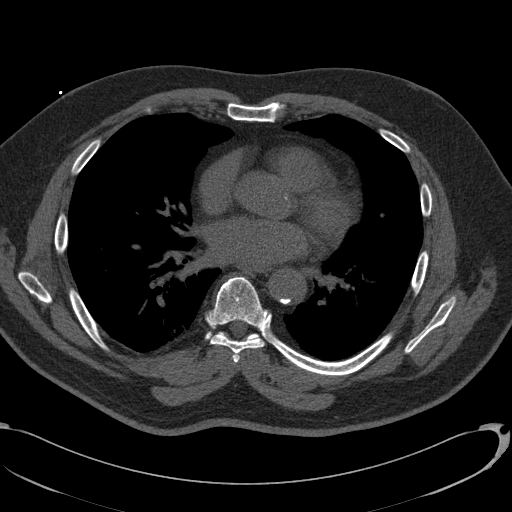
[im 100/123  vessel]
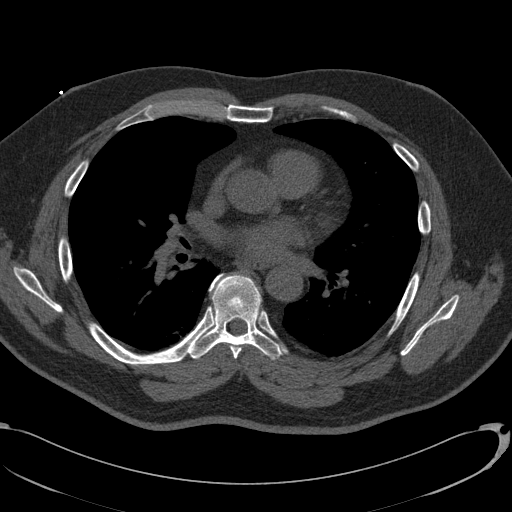
[im 100/123  lung]
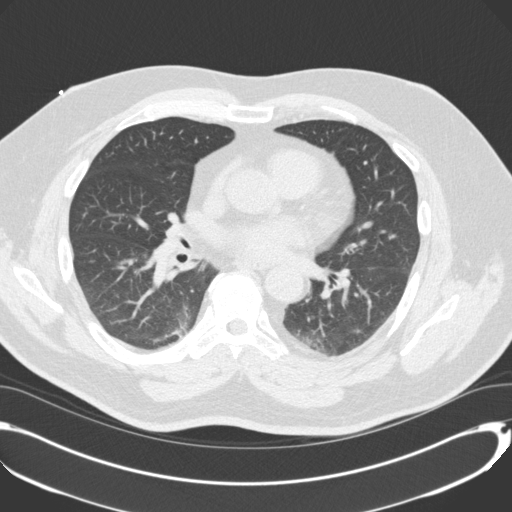
[im 111/123  vessel]
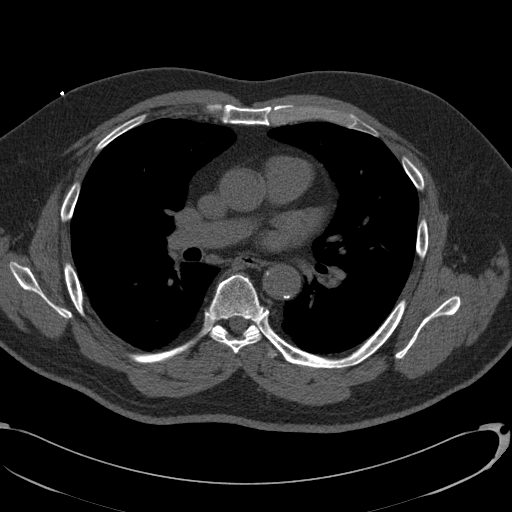

[10 of 20 positions shown; findings below may reference images not displayed]

FINDINGS: Non-cardiac: See separate report from [REDACTED].

Ascending Aorta: Normal size. Mild diffuse calcifications in the
aortic root.

Pericardium: Normal.

Coronary arteries:  Normal origin.
IMPRESSION: Coronary calcium score of 5. This was 29 percentile for age and sex
matched control.

Stiven Sekar

EXAM:
OVER-READ INTERPRETATION  CT CHEST

The following report is an over-read performed by radiologist Dr.
over-read does not include interpretation of cardiac or coronary
anatomy or pathology. The coronary calcium score interpretation by
the cardiologist is attached.
FINDINGS: There are no significant nonvascular findings within the visualized
mediastinum. The lungs demonstrate mild central airway thickening
and dependent atelectasis bilaterally. There is no confluent
airspace opacity or suspicious pulmonary nodule. The visualized
upper abdomen appears unremarkable.
IMPRESSION: 1. No significant extravascular findings within the lower chest.
2. Cardiac and vascular findings dictated separately.

## 2016-06-29 ENCOUNTER — Telehealth: Payer: Self-pay | Admitting: Cardiology

## 2016-06-29 NOTE — Telephone Encounter (Signed)
Craig Roberson is calling to get some lab results . Please call

## 2016-06-29 NOTE — Telephone Encounter (Signed)
Informed patient that not all results are complete yet. He understands he will be called as soon as they are resulted and to continue current treatment plan until that time. He was grateful for call.

## 2016-07-03 ENCOUNTER — Other Ambulatory Visit: Payer: 59

## 2016-07-03 ENCOUNTER — Telehealth: Payer: Self-pay | Admitting: Cardiology

## 2016-07-03 LAB — BASIC METABOLIC PANEL
Chloride: 100 mmol/L (ref 96–106)
Potassium: 4.6 mmol/L (ref 3.5–5.2)
Sodium: 143 mmol/L (ref 134–144)

## 2016-07-03 LAB — TSH: TSH: 2.41 u[IU]/mL (ref 0.450–4.500)

## 2016-07-03 LAB — MAGNESIUM

## 2016-07-03 NOTE — Telephone Encounter (Signed)
New Message  Pt voiced he's calling because no one went over is lab results as of yet.  Please f/u

## 2016-07-03 NOTE — Telephone Encounter (Signed)
Per Lanny Hurst in the lab, he called pt back and he is coming in for retake of his labs today.

## 2016-07-03 NOTE — Telephone Encounter (Signed)
New Message  Pt voiced no one has went over his lab results with him as of yet.  Please f/u with pt

## 2016-07-04 ENCOUNTER — Other Ambulatory Visit: Payer: 59 | Admitting: *Deleted

## 2016-07-04 DIAGNOSIS — I480 Paroxysmal atrial fibrillation: Secondary | ICD-10-CM | POA: Diagnosis not present

## 2016-07-04 DIAGNOSIS — J301 Allergic rhinitis due to pollen: Secondary | ICD-10-CM | POA: Diagnosis not present

## 2016-07-04 LAB — BASIC METABOLIC PANEL
BUN / CREAT RATIO: 12 (ref 10–24)
BUN: 11 mg/dL (ref 8–27)
CO2: 27 mmol/L (ref 18–29)
CREATININE: 0.92 mg/dL (ref 0.76–1.27)
Calcium: 9 mg/dL (ref 8.6–10.2)
Chloride: 102 mmol/L (ref 96–106)
GFR calc Af Amer: 103 mL/min/{1.73_m2} (ref >59)
GFR, EST NON AFRICAN AMERICAN: 89 mL/min/{1.73_m2} (ref >59)
GLUCOSE: 239 mg/dL — AB (ref 65–99)
POTASSIUM: 4.4 mmol/L (ref 3.5–5.2)
SODIUM: 138 mmol/L (ref 134–144)

## 2016-07-04 LAB — MAGNESIUM: MAGNESIUM: 2 mg/dL (ref 1.6–2.3)

## 2016-07-06 ENCOUNTER — Telehealth: Payer: Self-pay | Admitting: Emergency Medicine

## 2016-07-06 ENCOUNTER — Telehealth: Payer: Self-pay | Admitting: Physician Assistant

## 2016-07-06 NOTE — Telephone Encounter (Signed)
Pt states, he checks blood sugars once week maybe.  Unsure of readings. Advised to monitor blood sugars at home and keep a log of readings. Pt transferred to registration to schedule office visit for Diabetes f/u

## 2016-07-06 NOTE — Telephone Encounter (Signed)
-----   Message from Sueanne Margarita, MD sent at 07/06/2016 10:37 AM EST ----- Stable labs - continue current meds.  Forward elevated BS to PCP

## 2016-07-06 NOTE — Telephone Encounter (Signed)
Please call this patient. The labs ordered by the cardiologist reveal elevated blood sugar. Is he checking them at home? If so, what are they running?  When I saw him 02/22/16, I recommended re-evaluation in 3 months. He came in 12/05 and saw Dr. Nolon Rod for a skin lesion, and was in Afib and sent straight to cardiology, so we never rechecked his diabetes.  Please ask him to come in to discuss his sugars and update A1C.

## 2016-07-07 ENCOUNTER — Ambulatory Visit (INDEPENDENT_AMBULATORY_CARE_PROVIDER_SITE_OTHER): Payer: 59

## 2016-07-07 ENCOUNTER — Other Ambulatory Visit: Payer: Self-pay | Admitting: *Deleted

## 2016-07-07 DIAGNOSIS — I480 Paroxysmal atrial fibrillation: Secondary | ICD-10-CM

## 2016-07-07 LAB — EXERCISE TOLERANCE TEST
CSEPED: 5 min
CSEPEDS: 52 s
CSEPEW: 7 METS
CSEPHR: 67 %
MPHR: 158 {beats}/min
Peak HR: 107 {beats}/min
RPE: 17
Rest HR: 61 {beats}/min

## 2016-07-07 MED ORDER — FLECAINIDE ACETATE 100 MG PO TABS
100.0000 mg | ORAL_TABLET | Freq: Two times a day (BID) | ORAL | 3 refills | Status: DC
Start: 2016-07-07 — End: 2016-07-25

## 2016-07-10 ENCOUNTER — Other Ambulatory Visit: Payer: Self-pay | Admitting: Physician Assistant

## 2016-07-10 DIAGNOSIS — H9313 Tinnitus, bilateral: Secondary | ICD-10-CM

## 2016-07-12 ENCOUNTER — Ambulatory Visit: Payer: 59 | Admitting: Cardiology

## 2016-07-13 ENCOUNTER — Other Ambulatory Visit: Payer: Self-pay | Admitting: Physician Assistant

## 2016-07-13 DIAGNOSIS — E782 Mixed hyperlipidemia: Secondary | ICD-10-CM

## 2016-07-13 NOTE — Telephone Encounter (Signed)
05/29/16 last refill 05/30/16 last ov

## 2016-07-13 NOTE — Telephone Encounter (Signed)
Meds ordered this encounter  Medications  . diazepam (VALIUM) 10 MG tablet    Sig: TAKE 1 TABLET BY MOUTH EVERY 6 HOURS AS NEEDED    Dispense:  30 tablet    Refill:  0    Not to exceed 5 additional fills before 06/02/2018PLEASE AUTHORIZE REFILLS.

## 2016-07-14 NOTE — Telephone Encounter (Signed)
rx faxed to [facility] for pt

## 2016-07-25 ENCOUNTER — Ambulatory Visit (INDEPENDENT_AMBULATORY_CARE_PROVIDER_SITE_OTHER): Payer: 59 | Admitting: Physician Assistant

## 2016-07-25 ENCOUNTER — Encounter: Payer: Self-pay | Admitting: Physician Assistant

## 2016-07-25 VITALS — BP 108/72 | HR 60 | Temp 97.7°F | Resp 16 | Ht 70.0 in | Wt 263.0 lb

## 2016-07-25 DIAGNOSIS — E782 Mixed hyperlipidemia: Secondary | ICD-10-CM

## 2016-07-25 DIAGNOSIS — E119 Type 2 diabetes mellitus without complications: Secondary | ICD-10-CM

## 2016-07-25 LAB — POCT GLYCOSYLATED HEMOGLOBIN (HGB A1C): Hemoglobin A1C: 7.9

## 2016-07-25 MED ORDER — METFORMIN HCL 500 MG PO TABS: 500.0000 mg | ORAL_TABLET | Freq: Two times a day (BID) | ORAL | 3 refills | Status: DC

## 2016-07-25 NOTE — Patient Instructions (Addendum)
Please schedule an eye exam. These are folks I like: [facility]  [address] [place], [place] [address]  Phone: [phone]  [facility] [place], [place], [place] [address]  Phone: [phone]  [facility] [address], [place], [place] [address]  Phone: [phone]      IF you received an x-ray today, you will receive an invoice from [facility]. Please contact [facility] at [phone] with questions or concerns regarding your invoice.   IF you received labwork today, you will receive an invoice from [facility]. Please contact [facility] at [phone] with questions or concerns regarding your invoice.   Our billing staff will not be able to assist you with questions regarding bills from these companies.  You will be contacted with the lab results as soon as they are available. The fastest way to get your results is to activate your My Chart account. Instructions are located on the last page of this paperwork. If you have not heard from [place] regarding the results in 2 weeks, please contact this office.     Diabetes Mellitus and Food It is important for you to manage your blood sugar (glucose) level. Your blood glucose level can be greatly affected by what you eat. Eating healthier foods in the appropriate amounts throughout the day at about the same time each day will help you control your blood glucose level. It can also help slow or prevent worsening of your diabetes mellitus. Healthy eating may even help you improve the level of your blood pressure and reach or maintain a healthy weight. General recommendations for healthful eating and cooking habits include:  Eating meals and snacks regularly. Avoid going long periods of time without eating to lose weight.  Eating a diet that consists mainly of plant-based foods, such as fruits, vegetables, nuts, legumes, and whole grains.  Using low-heat cooking methods, such as baking, instead of  high-heat cooking methods, such as deep frying. Work with your dietitian to make sure you understand how to use the Nutrition Facts information on food labels. How can food affect me? Carbohydrates  Carbohydrates affect your blood glucose level more than any other type of food. Your dietitian will help you determine how many carbohydrates to eat at each meal and teach you how to count carbohydrates. Counting carbohydrates is important to keep your blood glucose at a healthy level, especially if you are using insulin or taking certain medicines for diabetes mellitus. Alcohol  Alcohol can cause sudden decreases in blood glucose (hypoglycemia), especially if you use insulin or take certain medicines for diabetes mellitus. Hypoglycemia can be a life-threatening condition. Symptoms of hypoglycemia (sleepiness, dizziness, and disorientation) are similar to symptoms of having too much alcohol. If your health care provider has given you approval to drink alcohol, do so in moderation and use the following guidelines:  Women should not have more than one drink per day, and men should not have more than two drinks per day. One drink is equal to:  12 oz of beer.  5 oz of wine.  1 oz of hard liquor.  Do not drink on an empty stomach.  Keep yourself hydrated. Have water, diet soda, or unsweetened iced tea.  Regular soda, juice, and other mixers might contain a lot of carbohydrates and should be counted. What foods are not recommended? As you make food choices, it is important to remember that all foods are not the same. Some foods have fewer nutrients per serving  than other foods, even though they might have the same number of calories or carbohydrates. It is difficult to get your body what it needs when you eat foods with fewer nutrients. Examples of foods that you should avoid that are high in calories and carbohydrates but low in nutrients include:  Trans fats (most processed foods list trans fats on  the Nutrition Facts label).  Regular soda.  Juice.  Candy.  Sweets, such as cake, pie, doughnuts, and cookies.  Fried foods. What foods can I eat? Eat nutrient-rich foods, which will nourish your body and keep you healthy. The food you should eat also will depend on several factors, including:  The calories you need.  The medicines you take.  Your weight.  Your blood glucose level.  Your blood pressure level.  Your cholesterol level. You should eat a variety of foods, including:  Protein.  Lean cuts of meat.  Proteins low in saturated fats, such as fish, egg whites, and beans. Avoid processed meats.  Fruits and vegetables.  Fruits and vegetables that may help control blood glucose levels, such as apples, mangoes, and yams.  Dairy products.  Choose fat-free or low-fat dairy products, such as milk, yogurt, and cheese.  Grains, bread, pasta, and rice.  Choose whole grain products, such as multigrain bread, whole oats, and brown rice. These foods may help control blood pressure.  Fats.  Foods containing healthful fats, such as nuts, avocado, olive oil, canola oil, and fish. Does everyone with diabetes mellitus have the same meal plan? Because every person with diabetes mellitus is different, there is not one meal plan that works for everyone. It is very important that you meet with a dietitian who will help you create a meal plan that is just right for you. This information is not intended to replace advice given to you by your health care provider. Make sure you discuss any questions you have with your health care provider. Document Released: 03/09/2005 Document Revised: 11/18/2015 Document Reviewed: 05/09/2013 Elsevier Interactive Patient Education  2017 [employer].

## 2016-07-25 NOTE — Progress Notes (Signed)
Patient ID: Craig Roberson, male    DOB: Nov 20, 1955, 61 y.o.   MRN: [id number]  PCP: [name], PA-C  Chief Complaint  Patient presents with  . Follow-up    re check labs    Subjective:   Presents for evaluation of diabetes.  Pt is a 61 yo caucasian male who presents for evaluation of type 2 diabetes mellitus. Pt saw cardiology on 06/27/2016 to follow up on paroxsymal atrial fibrillation/flutter and was in NSR at that time. He was reporting daily symptoms. His glucose was found to be >200. His last A1C with [place] was 6.8 on 02/22/16.  Today he is following up for A1C recheck and diabetes management. Pt states that he has not been measuring his blood glucose at home as he was asked to do, because he is afraid of needles. He admits to ongoing daily palpitations. Denies changes in vision, abdominal pain, nausea, vomiting, diarrhea, constipation, chest pain, polyuria, polydipsia, or peripheral neuropathy. Pt seems motivated to make dietary and exercise changes. He has attended one diabetes education class in June 2017.  Stress test on 07/07/16 was normal and Flecainide 50 mg was continued. His next appointment with cardiology is scheduled for 07/27/2016. Pt states that he has discussed ablation procedure as therapy with his cardiologist.  Pt also complains of worsening erectile dysfunction since the addition of Metoprolol and Rivaroxaban. He has tried Viagra and Cialis in the past and they caused flushing and his "heart to go crazy".   Review of Systems In addition to that stated in HPI above: Const: Denies fever, chills, fatigue, or weight changes. Pulm: Denies cough or SOB. Neuro: Denies headache or dizziness.    Patient Active Problem List   Diagnosis Date Noted  . OSA (obstructive sleep apnea) 07/27/2015  . Snoring 05/19/2015  . Excessive daytime sleepiness 05/19/2015  . PAF (paroxysmal atrial fibrillation) ([place]) 04/15/2015  . Family history of premature CAD 04/06/2015    . Heart palpitations 04/06/2015  . Diabetes mellitus type 2, uncomplicated ([place]) [id number]  . BMI 32.0-32.9,adult 09/15/2014  . Degenerative disc disease, lumbar 09/15/2014  . Obesity (BMI 30-39.9) 09/02/2013  . DJD (degenerative joint disease), multiple sites 02/18/2013  . Diverticulosis of sigmoid colon 08/20/2012  . AR (allergic rhinitis) 03/19/2012  . Mixed hyperlipidemia 03/19/2012  . Cardiac murmur   . ED (erectile dysfunction)   . Tinnitus of both ears   . Macular degeneration   . Hernia      Prior to Admission medications   Medication Sig Start Date End Date Taking? Authorizing Provider  azelastine (ASTELIN) 0.1 % nasal spray Place 1 spray into both nostrils 2 (two) times daily as needed for rhinitis. Use in each nostril as directed   Yes Historical Provider, MD  cholecalciferol (VITAMIN D) 1000 UNITS tablet Take 1,000 Units by mouth daily.   Yes Historical Provider, MD  diazepam (VALIUM) 10 MG tablet TAKE 1 TABLET BY MOUTH EVERY 6 HOURS AS NEEDED 07/13/16  Yes [name], PA-C  ezetimibe-simvastatin (VYTORIN) 10-40 MG tablet TAKE 1 TABLET BY MOUTH AT BEDTIME. 07/13/16  Yes [name], PA-C  flecainide (TAMBOCOR) 100 MG tablet Take 1 tablet (100 mg total) by mouth 2 (two) times daily. Patient taking differently: Take 50 mg by mouth 2 (two) times daily.  07/07/16  Yes Jerline Pain, MD  fluticasone (FLONASE) 50 MCG/ACT nasal spray Place 2 sprays into both nostrils daily. 10/05/15  Yes [name], PA-C  guaiFENesin (MUCINEX) 600 MG 12 hr tablet Take  600 mg by mouth 2 (two) times daily as needed for cough or to loosen phlegm.   Yes Historical Provider, MD  metoprolol (LOPRESSOR) 50 MG tablet Take 1 tablet (50 mg total) by mouth 2 (two) times daily. 05/30/16 08/28/16 Yes Bhavinkumar Bhagat, PA  Omega-3 Fatty Acids (FISH OIL) 1000 MG CAPS Take 12,000 mg by mouth daily. Reported on 10/08/2015   Yes Historical Provider, MD  rivaroxaban (XARELTO) 20 MG TABS tablet Take 1 tablet  (20 mg total) by mouth daily with supper. 05/30/16  Yes Leanor Kail, PA     Allergies  Allergen Reactions  . Codeine Other (See Comments)    Dizziness; faint  . Hydrocodone-Acetaminophen     Unknown per pt   . Methocarbamol Other (See Comments)    HA, tinnitus  . Morphine And Related Other (See Comments)    Severe Headache  . Nsaids     BRBPR       Objective:  Physical Exam HEENT: PERRLA Pulm: Good respiratory effort. CTAB. No wheezes, rales, or rhonchi. CV: RRR. No M/R/G. Abd: Soft, nontender, nondistended. + BS x 4 quadrants.   Results for orders placed or performed in visit on 07/25/16  POCT glycosylated hemoglobin (Hb A1C)  Result Value Ref Range   Hemoglobin A1C 7.9       Assessment & Plan:   1. Type 2 diabetes mellitus without complication, without long-term current use of insulin (HCC) Pt advised to start metformin, GI upset is possible, take with food. Pt advised to make an appointment for an eye exam. Diabetes education given. Follow up in 3 months. - Normal Diabetic Foot Exam - Microalbumin, urine - POCT glycosylated hemoglobin (Hb A1C) - metFORMIN (GLUCOPHAGE) 500 MG tablet; Take 1 tablet (500 mg total) by mouth 2 (two) times daily with a meal.  Dispense: 180 tablet; Refill: 3  2. Mixed hyperlipidemia Stable per labs on 02/24/16. Recheck pending. - Lipid panel   Lorella Nimrod, PA-S

## 2016-07-25 NOTE — Progress Notes (Signed)
Patient ID: Craig Roberson, male    DOB: 27-May-1956, 61 y.o.   MRN: [id number]  PCP: [name], PA-C  Chief Complaint  Patient presents with  . Follow-up    re check labs    Subjective:   Presents for evaluation of diabetes.  He saw cardiology 06/27/2016 to follow-up paroxysmal atrial fibrillation/flutter, at which time he was in NSR, though he reported episodes of breakthrough symptoms daily. Due to failure of rate control therapy, flecainide 50 mg BID was added to his regimen of Xarelto and metoprolol. ETT scheduled. Labs updated and revealed glucose >200. A1C at his last visit here had been 6.8%, and he was contacted to schedule follow-up with me.   Stress test was normal and flecainide dose was increased to 100 mg, but then reduced back to 50 mg BID due to report that his "heart went crazy." Reports continued episodic palpitations. Considering ablation procedure. Follow-up with cardiology on 07/27/2016.  No home glucose checks because he doesn't like needles.  Notes increased erectile dysfunction since starting metoprolol and Xarelto. Has not tolerated Viagra or Cialis previously due to flushing and "heart goes crazy." Wants to know about lifestyle changes that can reduce his need for so many medications.  No visual changes. No polydipsia or polyuria in the daytime. Nocturia is not new for him.    Review of Systems As above. No fever, chills, nausea, vomiting or diarrhea. No CP, SOB, dizziness. No fatigue.    Patient Active Problem List   Diagnosis Date Noted  . OSA (obstructive sleep apnea) 07/27/2015  . Snoring 05/19/2015  . Excessive daytime sleepiness 05/19/2015  . PAF (paroxysmal atrial fibrillation) ([place]) 04/15/2015  . Family history of premature CAD 04/06/2015  . Heart palpitations 04/06/2015  . Diabetes mellitus type 2, uncomplicated ([place]) [id number]  . BMI 32.0-32.9,adult 09/15/2014  . Degenerative disc disease, lumbar 09/15/2014  . Obesity  (BMI 30-39.9) 09/02/2013  . DJD (degenerative joint disease), multiple sites 02/18/2013  . Diverticulosis of sigmoid colon 08/20/2012  . AR (allergic rhinitis) 03/19/2012  . Mixed hyperlipidemia 03/19/2012  . Cardiac murmur   . ED (erectile dysfunction)   . Tinnitus of both ears   . Macular degeneration   . Hernia     Prior to Admission medications   Medication Sig Start Date End Date Taking? Authorizing Provider  azelastine (ASTELIN) 0.1 % nasal spray Place 1 spray into both nostrils 2 (two) times daily as needed for rhinitis. Use in each nostril as directed   Yes Historical Provider, MD  cholecalciferol (VITAMIN D) 1000 UNITS tablet Take 1,000 Units by mouth daily.   Yes Historical Provider, MD  diazepam (VALIUM) 10 MG tablet TAKE 1 TABLET BY MOUTH EVERY 6 HOURS AS NEEDED 07/13/16  Yes Hatsumi Steinhart, PA-C  ezetimibe-simvastatin (VYTORIN) 10-40 MG tablet TAKE 1 TABLET BY MOUTH AT BEDTIME. 07/13/16  Yes Donjuan Robison, PA-C  fluticasone (FLONASE) 50 MCG/ACT nasal spray Place 2 sprays into both nostrils daily. 10/05/15  Yes Naiya Corral, PA-C  guaiFENesin (MUCINEX) 600 MG 12 hr tablet Take 600 mg by mouth 2 (two) times daily as needed for cough or to loosen phlegm.   Yes Historical Provider, MD  metoprolol (LOPRESSOR) 50 MG tablet Take 1 tablet (50 mg total) by mouth 2 (two) times daily. 05/30/16 08/28/16 Yes Bhavinkumar Bhagat, PA  Omega-3 Fatty Acids (FISH OIL) 1000 MG CAPS Take 12,000 mg by mouth daily. Reported on 10/08/2015   Yes Historical Provider, MD  rivaroxaban (XARELTO) 20 MG TABS  tablet Take 1 tablet (20 mg total) by mouth daily with supper. 05/30/16  Yes Bhavinkumar Bhagat, PA  flecainide (TAMBOCOR) 50 MG tablet  07/20/16   Historical Provider, MD       Allergies  Allergen Reactions  . Codeine Other (See Comments)    Dizziness; faint  . Hydrocodone-Acetaminophen     Unknown per pt   . Methocarbamol Other (See Comments)    HA, tinnitus  . Morphine And Related Other (See  Comments)    Severe Headache  . Nsaids     BRBPR       Objective:  Physical Exam  Constitutional: He is oriented to person, place, and time. He appears well-developed and well-nourished. He is active and cooperative. No distress.  BP 108/72   Pulse 60   Temp 97.7 F (36.5 C) (Oral)   Resp 16   Ht 5\' 10"  (1.778 m)   Wt 263 lb (119.3 kg)   SpO2 96%   BMI 37.74 kg/m   HENT:  Head: Normocephalic and atraumatic.  Right Ear: Hearing normal.  Left Ear: Hearing normal.  Eyes: Conjunctivae are normal. No scleral icterus.  Neck: Normal range of motion. Neck supple. No thyromegaly present.  Cardiovascular: Normal rate, regular rhythm and normal heart sounds.   Pulses:      Radial pulses are 2+ on the right side, and 2+ on the left side.  Pulmonary/Chest: Effort normal and breath sounds normal.  Lymphadenopathy:       Head (right side): No tonsillar, no preauricular, no posterior auricular and no occipital adenopathy present.       Head (left side): No tonsillar, no preauricular, no posterior auricular and no occipital adenopathy present.    He has no cervical adenopathy.       Right: No supraclavicular adenopathy present.       Left: No supraclavicular adenopathy present.  Neurological: He is alert and oriented to person, place, and time. No sensory deficit.  Skin: Skin is warm, dry and intact. No rash noted. No cyanosis or erythema. Nails show no clubbing.  Psychiatric: He has a normal mood and affect. His speech is normal and behavior is normal.       Results for orders placed or performed in visit on 07/25/16  POCT glycosylated hemoglobin (Hb A1C)  Result Value Ref Range   Hemoglobin A1C 7.9     Wt Readings from Last 3 Encounters:  07/25/16 263 lb (119.3 kg)  06/27/16 261 lb (118.4 kg)  05/30/16 264 lb (119.7 kg)       Assessment & Plan:   1. Type 2 diabetes mellitus without complication, without long-term current use of insulin (HCC) No longer controlled. Start  metformin BID. Encouraged healthy lifestyle changes. He declines repeat diabetes education classes. - Microalbumin, urine - POCT glycosylated hemoglobin (Hb A1C) - metFORMIN (GLUCOPHAGE) 500 MG tablet; Take 1 tablet (500 mg total) by mouth 2 (two) times daily with a meal.  Dispense: 180 tablet; Refill: 3  2. Mixed hyperlipidemia Await labs. Adjust regimen as indicated by results. - Lipid panel  Return in about 3 months (around 10/23/2016) for re-evaluation of diabetes.    Fara Chute, PA-C Physician Assistant-Certified [facility]

## 2016-07-26 LAB — LIPID PANEL
CHOL/HDL RATIO: 4.7 ratio (ref 0.0–5.0)
Cholesterol, Total: 182 mg/dL (ref 100–199)
HDL: 39 mg/dL — ABNORMAL LOW (ref >39)
LDL Calculated: 107 mg/dL — ABNORMAL HIGH (ref 0–99)
TRIGLYCERIDES: 179 mg/dL — AB (ref 0–149)
VLDL Cholesterol Cal: 36 mg/dL (ref 5–40)

## 2016-07-26 LAB — MICROALBUMIN, URINE: Microalbumin, Urine: 3.3 ug/mL

## 2016-07-27 ENCOUNTER — Other Ambulatory Visit: Payer: Self-pay | Admitting: Cardiology

## 2016-07-27 ENCOUNTER — Ambulatory Visit (INDEPENDENT_AMBULATORY_CARE_PROVIDER_SITE_OTHER): Payer: 59

## 2016-07-27 ENCOUNTER — Ambulatory Visit (INDEPENDENT_AMBULATORY_CARE_PROVIDER_SITE_OTHER): Payer: 59 | Admitting: Cardiology

## 2016-07-27 ENCOUNTER — Encounter: Payer: Self-pay | Admitting: Cardiology

## 2016-07-27 VITALS — BP 110/80 | HR 60 | Ht 70.0 in | Wt 264.0 lb

## 2016-07-27 DIAGNOSIS — I480 Paroxysmal atrial fibrillation: Secondary | ICD-10-CM

## 2016-07-27 DIAGNOSIS — R002 Palpitations: Secondary | ICD-10-CM

## 2016-07-27 NOTE — Patient Instructions (Addendum)
Medication Instructions:  Your physician recommends that you continue on your current medications as directed. Please refer to the Current Medication list given to you today.   Labwork: NONE  Testing/Procedures: Your physician has recommended that you wear an event monitor. Event monitors are medical devices that record the heart's electrical activity. Doctors most often [place] these monitors to diagnose arrhythmias. Arrhythmias are problems with the speed or rhythm of the heartbeat. The monitor is a small, portable device. You can wear one while you do your normal daily activities. This is usually used to diagnose what is causing palpitations/syncope (passing out).    Follow-Up: Your physician recommends that you schedule a follow-up appointment in: 3-4 weeks with Lyda Jester, PA or Dr. [name].    Any Other Special Instructions Will Be Listed Below (If Applicable).     If you need a refill on your cardiac medications before your next appointment, please call your pharmacy.

## 2016-07-27 NOTE — Progress Notes (Signed)
07/27/2016 Craig Roberson   1955/10/02  [id number]  Primary Physician [name], PA-C Primary Cardiologist: Dr. [name]    Reason for Visit/CC: Paroxsymal Atrial  Fibrillation/ Flutter  HPI:  The patient is a 61 y/o male, followed by Dr. [name] with a h/o paroxsymal atrial fibrillation/flutter mild OSA, HLD and DM (diet controlled - A1c was 6.8 on 02/02/16), who presents back to clinic for 4 week f/u. He was seen recently, in our clinic, on 05/30/16, after being sent from his PCP's office for recurrent symptomatic atrial flutter. This was in the setting of moderate-heavy caffeine consumption. He was seen by Gilberto Better, PA-C. EKG confirmed atrial flutter. He was started on Xarelto and his metoprolol was increased to 50 mg BID. He was advised to restrict caffeine and to f/u in 4 weeks for repeat EKG and to plan for elective DCCV if still out of rhythm.  I saw him for f/u on 06/27/16. EKG showed NSR with rate of 63 bpm. QT/QTc was 39/399 ms. He reported full, uninterrupted compliance with Sheila Oats. Also compliant with metoprolol and had changed over to decaffeinated coffee. Despite being in NSR, he reported frequent paroxsymal, breakthrough atrial fibrillation/ tachy palpitations, occuring almost daily. He was highly symptomatic. After discussion with Dr. Curt Bears, DOD, we decided to start him on AAD therapy with Flecainide, 50 mg BID. Metoprolol and Xarelto were continued. 1 week after initiation, he underwent an ETT. This was a normal study.   He presents back to clinic today for f/u. EKG shows sinus bradycardia with a rate of 58 bpm. QT/QTc is stable at 446/437 ms. He continues to complain of intermittent breakthrough palpitations that occur often. He reports full compliance with Flecainide, metoprolol and Xarelto. He states that he was given instruction after his stress test to increase his Flecainide to 100 mg BID but he did not tolerate the increase. He notes that he had even more  palpitations with the higher dose. He is now back on 50 mg BID.     No outpatient prescriptions have been marked as taking for the 07/27/16 encounter (Office Visit) with Consuelo Pandy, PA-C.   Allergies  Allergen Reactions  . Codeine Other (See Comments)    Dizziness; faint  . Hydrocodone-Acetaminophen     Unknown per pt   . Methocarbamol Other (See Comments)    HA, tinnitus  . Morphine And Related Other (See Comments)    Severe Headache  . Nsaids     BRBPR   Past Medical History:  Diagnosis Date  . Allergy   . Arthritis    bilateral thumbs  . BCC (basal cell carcinoma of skin) 04/2006   LEFT low back  . Cardiac murmur 1996   normal ECHO  . Diabetes mellitus without complication ([place])   . Diverticulosis   . ED (erectile dysfunction) 2006  . H/O ETOH abuse    Quit 2006  . Hernia    RIGHT inguinal  . Hyperlipidemia   . Macular degeneration   . PAF (paroxysmal atrial fibrillation) ([place]) 04/15/2015   During exercise myoview  . Tinnitus of both ears   . Ulcer ([place])    Family History  Problem Relation Age of Onset  . Heart disease Brother 19    AMI, normal weight, normal cholesterol  . Heart disease Father 41  . Cancer Father     Prostate  . Stroke Father   . Heart attack Father   . Stroke Son 64  . Hyperlipidemia Mother   .  Atrial fibrillation Brother   . Vascular Disease Brother     carotid artery disease  . Hypertension Brother    Past Surgical History:  Procedure Laterality Date  . GANGLION CYST EXCISION  1970's  . LUMBAR FUSION     L3-4; Dr. Ellene Route  . SPINE SURGERY    . THUMB FUSION     BILATERAL  . VASECTOMY  1976   Social History   Social History  . Marital status: Married    Spouse name: [name]  . Number of children: 2  . Years of education: 8   Occupational History  . Retired Retired    Careers information officer   Social History Main Topics  . Smoking status: Former Research scientist (life sciences)  . Smokeless tobacco: Former Systems developer    Types: Chew      Comment: some days  . Alcohol use No  . Drug use: No  . Sexual activity: Not Currently    Partners: Female     Comment: family stress; his back pain have reduced the frequency   Other Topics Concern  . Not on file   Social History Narrative   Married to 3rd wife.     Review of Systems: General: negative for chills, fever, night sweats or weight changes.  Cardiovascular: negative for chest pain, dyspnea on exertion, edema, orthopnea, palpitations, paroxysmal nocturnal dyspnea or shortness of breath Dermatological: negative for rash Respiratory: negative for cough or wheezing Urologic: negative for hematuria Abdominal: negative for nausea, vomiting, diarrhea, bright red blood per rectum, melena, or hematemesis Neurologic: negative for visual changes, syncope, or dizziness All other systems reviewed and are otherwise negative except as noted above.   Physical Exam:  Height 5\' 10"  (1.778 m).  General appearance: alert, cooperative and no distress Neck: no carotid bruit and no JVD Lungs: clear to auscultation bilaterally Heart: regular rate and rhythm, S1, S2 normal, no murmur, click, rub or gallop Extremities: extremities normal, atraumatic, no cyanosis or edema Pulses: 2+ and symmetric Skin: Skin color, texture, turgor normal. No rashes or lesions Neurologic: Grossly normal  EKG Sinus Bradycardia 58 bpm QT/QTc 446/437 ms  ASSESSMENT AND PLAN:   1. Paroxsymal Atrial Flutter/Fibrillation: Pt has failed rate control therapy. AAD therapy with Flecainide was initiated at last OV, 50 mg BID. He is in NSR based on EKG today however he continues to have breakthrough palpitations on a daily basis and is symptomatic. He is convinced it is recurrent afib/flutter. His ETT following initiation of Flecainide was normal. He was instructed following stress test to increase Flecainide to 100 mg BID but he was unable to tolerate the higher dose (increased palpitations). Given his recurrent  breakthrough symptoms, we will arrange for him to wear a 2 week event monitor to assess if his palpitations are in fact afib/ flutter vs PACs/PVCs. If recurrent Afib/flutter then we will refer to EP regarding change in AAD vs possible ablation. He was instructed to continue Flecainide, Metoprolol and Xarelto for now.     Eveny Anastas PA-C 07/27/2016 8:03 AM

## 2016-07-28 MED ORDER — EZETIMIBE 10 MG PO TABS: 10.0000 mg | ORAL_TABLET | Freq: Every day | ORAL | 3 refills | Status: DC

## 2016-07-28 MED ORDER — ATORVASTATIN CALCIUM 80 MG PO TABS: 80.0000 mg | ORAL_TABLET | Freq: Every day | ORAL | 3 refills | Status: DC

## 2016-07-28 NOTE — Addendum Note (Signed)
Addended by: Fara Chute on: 07/28/2016 10:56 AM   Modules accepted: Orders

## 2016-07-31 ENCOUNTER — Other Ambulatory Visit: Payer: Self-pay | Admitting: Physician Assistant

## 2016-07-31 DIAGNOSIS — H9313 Tinnitus, bilateral: Secondary | ICD-10-CM

## 2016-08-01 ENCOUNTER — Telehealth: Payer: Self-pay | Admitting: Nurse Practitioner

## 2016-08-01 ENCOUNTER — Other Ambulatory Visit: Payer: Self-pay | Admitting: Physician Assistant

## 2016-08-01 DIAGNOSIS — H9313 Tinnitus, bilateral: Secondary | ICD-10-CM

## 2016-08-01 NOTE — Telephone Encounter (Signed)
I just authorized this on 07/13/16. Is he needing this more frequently?

## 2016-08-01 NOTE — Telephone Encounter (Signed)
Monitor strip received from earlier conversation today.  Strip reviewed with Lyda Jester and she advised that the pt should be referred to EP for Atrial Flutter.  I contacted the pt and arranged appointment with Dr Curt Bears on 08/07/16 for EP consult.

## 2016-08-01 NOTE — Telephone Encounter (Signed)
Received call from operator from [employer] monitoring [employer] with reports of atrial flutter at 70 bpm this morning. I thanked the technician for the call and advised I will route message to the provider.

## 2016-08-02 NOTE — Telephone Encounter (Signed)
This is a controlled substance and patient is high risk given diagnoses of uncontrolled diabetes, atrial fibrillation, history of alcohol abuse. His last OV was 07/25/2016 but there is no mention of using Valium. Our CS policy dictates that one provider should follow and prescribe CS. I will forward this to PCP, PA-Jeffery.

## 2016-08-02 NOTE — Telephone Encounter (Signed)
Faxed to pharmacy

## 2016-08-02 NOTE — Telephone Encounter (Signed)
This was done yesterday.  

## 2016-08-07 ENCOUNTER — Other Ambulatory Visit: Payer: Self-pay | Admitting: Cardiology

## 2016-08-07 ENCOUNTER — Ambulatory Visit (INDEPENDENT_AMBULATORY_CARE_PROVIDER_SITE_OTHER): Payer: 59 | Admitting: Cardiology

## 2016-08-07 ENCOUNTER — Encounter: Payer: Self-pay | Admitting: Cardiology

## 2016-08-07 VITALS — BP 148/78 | HR 94 | Ht 69.0 in | Wt 264.4 lb

## 2016-08-07 DIAGNOSIS — Z01812 Encounter for preprocedural laboratory examination: Secondary | ICD-10-CM | POA: Diagnosis not present

## 2016-08-07 DIAGNOSIS — I483 Typical atrial flutter: Secondary | ICD-10-CM

## 2016-08-07 DIAGNOSIS — I480 Paroxysmal atrial fibrillation: Secondary | ICD-10-CM

## 2016-08-07 NOTE — Progress Notes (Signed)
Electrophysiology Office Note   Date:  08/07/2016   ID:  Craig Roberson, DOB 02-15-1956, MRN [id number]  PCP:  [name], PA-C  Cardiologist:  [name] Primary Electrophysiologist:  Adis Sturgill Meredith Leeds, MD    Chief Complaint  Patient presents with  . New Patient (Initial Visit)    EP consult for Aflutter on monitor     History of Present Illness: Craig Roberson is a 61 y.o. male who presents today for electrophysiology evaluation.   He has a history of paroxsymal atrial fibrillation/flutter mild OSA, HLD and DM. An episode of A. fib with RVR in December after heavy caffeine use. He was started on Xarelto metoprolol at the time. When he was seen back in January, he was found to be in sinus rhythm with a rate of 63. He was continuing to have palpitations most days. He was started on flecainide 50 mg. He was seen again in February and was found to have intermittent breakthrough palpitations. His flecainide was increased to 100 mg twice a day but he did not tolerate this.   Today, he denies symptoms of chest pain, shortness of breath, orthopnea, PND, lower extremity edema, claudication, dizziness, presyncope, syncope, bleeding, or neurologic sequela. The patient is tolerating medications without difficulties and is otherwise without complaint today.    Past Medical History:  Diagnosis Date  . Allergy   . Arthritis    bilateral thumbs  . BCC (basal cell carcinoma of skin) 04/2006   LEFT low back  . Cardiac murmur 1996   normal ECHO  . Diabetes mellitus without complication ([place])   . Diverticulosis   . ED (erectile dysfunction) 2006  . H/O ETOH abuse    Quit 2006  . Hernia    RIGHT inguinal  . Hyperlipidemia   . Macular degeneration   . PAF (paroxysmal atrial fibrillation) ([place]) 04/15/2015   During exercise myoview  . Tinnitus of both ears   . Ulcer [facility])    Past Surgical History:  Procedure Laterality Date  . GANGLION CYST EXCISION  1970's  . LUMBAR FUSION      L3-4; Dr. Ellene Route  . SPINE SURGERY    . THUMB FUSION     BILATERAL  . VASECTOMY  1976     Current Outpatient Prescriptions  Medication Sig Dispense Refill  . atorvastatin (LIPITOR) 80 MG tablet Take 1 tablet (80 mg total) by mouth daily. 90 tablet 3  . azelastine (ASTELIN) 0.1 % nasal spray Place 1 spray into both nostrils 2 (two) times daily as needed for rhinitis. Use in each nostril as directed    . cholecalciferol (VITAMIN D) 1000 UNITS tablet Take 1,000 Units by mouth daily.    . diazepam (VALIUM) 10 MG tablet TAKE 1 TABLET BY MOUTH EVERY 6 HOURS AS NEEDED 30 tablet 0  . ezetimibe (ZETIA) 10 MG tablet Take 1 tablet (10 mg total) by mouth daily. 90 tablet 3  . flecainide (TAMBOCOR) 50 MG tablet     . fluticasone (FLONASE) 50 MCG/ACT nasal spray Place 2 sprays into both nostrils daily. 16 g 12  . guaiFENesin (MUCINEX) 600 MG 12 hr tablet Take 600 mg by mouth 2 (two) times daily as needed for cough or to loosen phlegm.    . metFORMIN (GLUCOPHAGE) 500 MG tablet Take 1 tablet (500 mg total) by mouth 2 (two) times daily with a meal. 180 tablet 3  . metoprolol (LOPRESSOR) 50 MG tablet Take 1 tablet (50 mg total) by mouth 2 (two) times  daily. 180 tablet 3  . Omega-3 Fatty Acids (FISH OIL) 1000 MG CAPS Take 12,000 mg by mouth daily. Reported on 10/08/2015    . rivaroxaban (XARELTO) 20 MG TABS tablet Take 1 tablet (20 mg total) by mouth daily with supper. 90 tablet 2   No current facility-administered medications for this visit.     Allergies:   Codeine; Hydrocodone-acetaminophen; Methocarbamol; Morphine and related; and Nsaids   Social History:  The patient  reports that he has quit smoking. He has quit using smokeless tobacco. His smokeless tobacco use included Chew. He reports that he does not drink alcohol or use drugs.   Family History:  The patient's family history includes Atrial fibrillation in his brother; Cancer in his father; Heart attack in his father; Heart disease (age of  onset: 53) in his father; Heart disease (age of onset: 31) in his brother; Hyperlipidemia in his mother; Hypertension in his brother; Stroke in his father; Stroke (age of onset: 80) in his son; Vascular Disease in his brother.    ROS:  Please see the history of present illness.   Otherwise, review of systems is positive for explained weight change, leg swelling, palpitations, hearing loss, back pain, muscle pain, joint swelling.   All other systems are reviewed and negative.    PHYSICAL EXAM: VS:  BP (!) 148/78   Pulse 94   Ht 5\' 9"  (1.753 m)   Wt 264 lb 6.4 oz (119.9 kg)   BMI 39.05 kg/m  , BMI Body mass index is 39.05 kg/m. GEN: Well nourished, well developed, in no acute distress  HEENT: normal  Neck: no JVD, carotid bruits, or masses Cardiac: iRRR; no murmurs, rubs, or gallops,no edema  Respiratory:  clear to auscultation bilaterally, normal work of breathing GI: soft, nontender, nondistended, + BS MS: no deformity or atrophy  Skin: warm and dry Neuro:  Strength and sensation are intact Psych: euthymic mood, full affect  EKG:  EKG is not ordered today. Personal review of the ekg ordered 07/27/16 shows sinus rhythm, rate 58, first-degree AV block  Recent Labs: 02/22/2016: ALT 18 06/27/2016: TSH 2.410 07/04/2016: BUN 11; Creatinine, Ser 0.92; Magnesium 2.0; Potassium 4.4; Sodium 138    Lipid Panel     Component Value Date/Time   CHOL 182 07/25/2016 0842   TRIG 179 (H) 07/25/2016 0842   HDL 39 (L) 07/25/2016 0842   CHOLHDL 4.7 07/25/2016 0842   CHOLHDL 4.1 02/22/2016 1237   VLDL 25 02/22/2016 1237   LDLCALC 107 (H) 07/25/2016 0842     Wt Readings from Last 3 Encounters:  08/07/16 264 lb 6.4 oz (119.9 kg)  07/27/16 264 lb (119.7 kg)  07/25/16 263 lb (119.3 kg)      Other studies Reviewed: Additional studies/ records that were reviewed today include: TTE 04/22/15  Review of the above records today demonstrates:  - Left ventricle: The cavity size was normal. There was  mild focal   basal hypertrophy of the septum. Systolic function was normal.   The estimated ejection fraction was in the range of 55% to 60%.   Wall motion was normal; there were no regional wall motion   abnormalities. Left ventricular diastolic function parameters   were normal. - Right ventricle: The cavity size was normal. Wall thickness was   normal. Systolic function was normal. - Atrial septum: No defect or patent foramen ovale was identified. - Tricuspid valve: There was no regurgitation. - Pulmonic valve: There was trivial regurgitation. - Inferior vena cava: The vessel  was normal in size. The   respirophasic diameter changes were in the normal range (>= 50%),   consistent with normal central venous pressure.   ASSESSMENT AND PLAN:  1.  Proximal atrial fibrillation/atrial flutter: Currently is on flecainide, metoprolol, and his relative. Fitted with a 2 week event monitor due to palpitations on flecainide. Unfortunately he was unable to tolerate 100 mg dose of flecainide, and is back down to 50 mg twice a day. His monitor showed atrial flutter. I discussed with him options of further therapy with medications versus ablation. Risks and benefits of ablation were discussed. Risks include bleeding, tamponade, heart block, stroke, and damage to surrounding organs. He understands these risks and has agreed to ablation. Of note his blood pressure is elevated today, but he is out of rhythm. His prior blood pressures, when he is been in sinus rhythm, are normal.  This patients CHA2DS2-VASc Score and unadjusted Ischemic Stroke Rate (% per year) is equal to 0.6 % stroke rate/year from a score of 1  Above score calculated as 1 point each if present [CHF, HTN, DM, Vascular=MI/PAD/Aortic Plaque, Age if 65-74, or Male] Above score calculated as 2 points each if present [Age > 75, or Stroke/TIA/TE]  2. Hyperlipidemia: Continue atorvastatin and zetia.  3. OSA: Stress therapy for sleep apnea.  Craig Roberson refer to dental 4 an oral device.   Current medicines are reviewed at length with the patient today.   The patient does not have concerns regarding his medicines.  The following changes were made today:  none  Labs/ tests ordered today include:  Orders Placed This Encounter  Procedures  . Basic Metabolic Panel (BMET)  . CBC w/Diff     Disposition:   FU with Moua Rasmusson 2 months  Signed, Ismael Karge Meredith Leeds, MD  08/07/2016 10:37 AM     [facility] [address] [place] [address] [phone] (office) [phone] (fax)

## 2016-08-07 NOTE — Patient Instructions (Signed)
Medication Instructions:    Your physician recommends that you continue on your current medications as directed. Please refer to the Current Medication list given to you today.  --- If you need a refill on your cardiac medications before your next appointment, please call your pharmacy. ---  Labwork:  None ordered  Testing/Procedures: Your physician has recommended that you have an ablation. Catheter ablation is a medical procedure used to treat some cardiac arrhythmias (irregular heartbeats). During catheter ablation, a long, thin, flexible tube is put into a blood vessel in your groin (upper thigh), or neck. This tube is called an ablation catheter. It is then guided to your heart through the blood vessel. Radio frequency waves destroy small areas of heart tissue where abnormal heartbeats may cause an arrhythmia to start.   Mayola Mcbain, RN will call you to arrange this procedure.  Follow-Up:  To be determined once procedure is scheduled.  Thank you for choosing [facility]!!   Darrelle Barrell, RN [phone]   Any Other Special Instructions Will Be Listed Below (If Applicable).  Cardiac Ablation Cardiac ablation is a procedure to disable a small amount of heart tissue in very specific places. The heart has many electrical connections. Sometimes these connections are abnormal and can cause the heart to beat very fast or irregularly. By disabling some of the problem areas, heart rhythm can be improved or made normal. Ablation is done for people who:   Have Wolff-Parkinson-White syndrome.   Have other fast heart rhythms (tachycardia).   Have taken medicines for an abnormal heart rhythm (arrhythmia) that resulted in:   No success.   Side effects.   May have a high-risk heartbeat that could result in death.  LET YOUR HEALTH CARE PROVIDER KNOW ABOUT:   Any allergies you have or any previous reactions you have had to X-ray dye, food (such as seafood), medicine, or tape.    All medicines you are taking, including vitamins, herbs, eye drops, creams, and over-the-counter medicines.   Previous problems you or members of your family have had with the use of anesthetics.   Any blood disorders you have.   Previous surgeries or procedures (such as a kidney transplant) you have had.   Medical conditions you have (such as kidney failure).  RISKS AND COMPLICATIONS Generally, cardiac ablation is a safe procedure. However, problems can occur and include:   Increased risk of cancer. Depending on how long it takes to do the ablation, the dose of radiation can be high.  Bruising and bleeding where a thin, flexible tube (catheter) was inserted during the procedure.   Bleeding into the chest, especially into the sac that surrounds the heart (serious).  Need for a permanent pacemaker if the normal electrical system is damaged.   The procedure may not be fully effective, and this may not be recognized for months. Repeat ablation procedures are sometimes required. BEFORE THE PROCEDURE   Follow any instructions from your health care provider regarding eating and drinking before the procedure.   Take your medicines as directed at regular times with water, unless instructed otherwise by your health care provider. If you are taking diabetes medicine, including insulin, ask how you are to take it and if there are any special instructions you should follow. It is common to adjust insulin dosing the day of the ablation.  PROCEDURE  An ablation is usually performed in a catheterization laboratory with the guidance of fluoroscopy. Fluoroscopy is a type of X-ray that helps your health care provider   see images of your heart during the procedure.   An ablation is a minimally invasive procedure. This means a small cut (incision) is made in either your neck or groin. Your health care provider will decide where to make the incision based on your medical history and physical  exam.  An IV tube will be started before the procedure begins. You will be given an anesthetic or medicine to help you relax (sedative).  The skin on your neck or groin will be numbed. A needle will be inserted into a large vein in your neck or groin and catheters will be threaded to your heart.  A special dye that shows up on fluoroscopy pictures may be injected through the catheter. The dye helps your health care provider see the area of the heart that needs treatment.  The catheter has electrodes on the tip. When the area of heart tissue that is causing the arrhythmia is found, the catheter tip will send an electrical current to the area and "scar" the tissue. Three types of energy can be used to ablate the heart tissue:   Heat (radiofrequency energy).   Laser energy.   Extreme cold (cryoablation).   When the area of the heart has been ablated, the catheter will be taken out. Pressure will be held on the insertion site. This will help the insertion site clot and keep it from bleeding. A bandage will be placed on the insertion site.  AFTER THE PROCEDURE   After the procedure, you will be taken to a recovery area where your vital signs (blood pressure, heart rate, and breathing) will be monitored. The insertion site will also be monitored for bleeding.   You will need to lie still for 4-6 hours. This is to ensure you do not bleed from the catheter insertion site.  This information is not intended to replace advice given to you by your health care provider. Make sure you discuss any questions you have with your health care provider. Document Released: 10/29/2008 Document Revised: 07/03/2014 Document Reviewed: 11/04/2012 Elsevier Interactive Patient Education  2017 Elsevier Inc.        

## 2016-08-25 ENCOUNTER — Ambulatory Visit: Payer: 59 | Admitting: Cardiology

## 2016-08-31 ENCOUNTER — Encounter: Payer: Self-pay | Admitting: *Deleted

## 2016-08-31 ENCOUNTER — Telehealth: Payer: Self-pay | Admitting: *Deleted

## 2016-08-31 DIAGNOSIS — I480 Paroxysmal atrial fibrillation: Secondary | ICD-10-CM

## 2016-08-31 NOTE — Telephone Encounter (Signed)
Scheduled AFib ablation for 09/28/16. Pre procedure labs on 3/23. Pt aware office will call him to arrange cardiac CT/post procedure f/u apps. Letter of instructions reviewed with pt and sent via [place]. Patient verbalized understanding and agreeable to plan.

## 2016-09-02 ENCOUNTER — Other Ambulatory Visit: Payer: Self-pay | Admitting: Physician Assistant

## 2016-09-02 DIAGNOSIS — H9313 Tinnitus, bilateral: Secondary | ICD-10-CM

## 2016-09-03 NOTE — Telephone Encounter (Signed)
08/01/16 last refill

## 2016-09-04 NOTE — Telephone Encounter (Signed)
Done

## 2016-09-04 NOTE — Telephone Encounter (Signed)
Called to [facility]. 

## 2016-09-07 ENCOUNTER — Telehealth: Payer: Self-pay | Admitting: Cardiology

## 2016-09-07 NOTE — Telephone Encounter (Signed)
New message    Patient has upcoming procedure with Dr. Curt Bears - has some questions from the nurse.

## 2016-09-07 NOTE — Telephone Encounter (Signed)
Called patient about his message. Patient was asking about an MRI or some other test before his procedure. Patient stated he is going to be out of town at the end of the month. Will send to Sherri, Dr. Curt Bears nurse, not sure of the test patient is talking about.

## 2016-09-11 ENCOUNTER — Encounter: Payer: Self-pay | Admitting: *Deleted

## 2016-09-11 NOTE — Telephone Encounter (Signed)
Informed pt that Ivin Booty would be contacting him soon to schedule this test. He understands she will contact him this week. He thanks me for updating him.

## 2016-09-11 NOTE — Telephone Encounter (Signed)
This encounter was created in error - please disregard.

## 2016-09-13 ENCOUNTER — Encounter: Payer: Self-pay | Admitting: Cardiology

## 2016-09-15 ENCOUNTER — Other Ambulatory Visit: Payer: 59 | Admitting: *Deleted

## 2016-09-15 DIAGNOSIS — Z01812 Encounter for preprocedural laboratory examination: Secondary | ICD-10-CM | POA: Diagnosis not present

## 2016-09-15 DIAGNOSIS — I483 Typical atrial flutter: Secondary | ICD-10-CM | POA: Diagnosis not present

## 2016-09-15 DIAGNOSIS — I480 Paroxysmal atrial fibrillation: Secondary | ICD-10-CM | POA: Diagnosis not present

## 2016-09-15 LAB — CBC WITH DIFFERENTIAL/PLATELET
BASOS ABS: 0 10*3/uL (ref 0.0–0.2)
BASOS: 0 %
EOS (ABSOLUTE): 0.3 10*3/uL (ref 0.0–0.4)
Eos: 4 %
Hematocrit: 43 % (ref 37.5–51.0)
Hemoglobin: 14.8 g/dL (ref 13.0–17.7)
Immature Grans (Abs): 0 10*3/uL (ref 0.0–0.1)
Immature Granulocytes: 0 %
LYMPHS ABS: 2.8 10*3/uL (ref 0.7–3.1)
Lymphs: 33 %
MCH: 31.6 pg (ref 26.6–33.0)
MCHC: 34.4 g/dL (ref 31.5–35.7)
MCV: 92 fL (ref 79–97)
MONOS ABS: 1 10*3/uL — AB (ref 0.1–0.9)
Monocytes: 12 %
NEUTROS ABS: 4.3 10*3/uL (ref 1.4–7.0)
Neutrophils: 51 %
PLATELETS: 338 10*3/uL (ref 150–379)
RBC: 4.68 x10E6/uL (ref 4.14–5.80)
RDW: 13.2 % (ref 12.3–15.4)
WBC: 8.4 10*3/uL (ref 3.4–10.8)

## 2016-09-15 LAB — BASIC METABOLIC PANEL
BUN / CREAT RATIO: 9 — AB (ref 10–24)
BUN: 9 mg/dL (ref 8–27)
CALCIUM: 9.6 mg/dL (ref 8.6–10.2)
CHLORIDE: 100 mmol/L (ref 96–106)
CO2: 25 mmol/L (ref 18–29)
Creatinine, Ser: 0.99 mg/dL (ref 0.76–1.27)
GFR calc non Af Amer: 81 mL/min/{1.73_m2} (ref >59)
GFR, EST AFRICAN AMERICAN: 94 mL/min/{1.73_m2} (ref >59)
Glucose: 134 mg/dL — ABNORMAL HIGH (ref 65–99)
POTASSIUM: 5.2 mmol/L (ref 3.5–5.2)
SODIUM: 140 mmol/L (ref 134–144)

## 2016-09-21 ENCOUNTER — Encounter (HOSPITAL_COMMUNITY): Payer: Self-pay

## 2016-09-21 ENCOUNTER — Ambulatory Visit (HOSPITAL_COMMUNITY)
Admission: RE | Admit: 2016-09-21 | Discharge: 2016-09-21 | Disposition: A | Discharge status: Home / Self Care | Payer: 59 | Source: Ambulatory Visit | Attending: Cardiology | Admitting: Cardiology

## 2016-09-21 DIAGNOSIS — I2510 Atherosclerotic heart disease of native coronary artery without angina pectoris: Secondary | ICD-10-CM | POA: Insufficient documentation

## 2016-09-21 DIAGNOSIS — I281 Aneurysm of pulmonary artery: Secondary | ICD-10-CM | POA: Insufficient documentation

## 2016-09-21 DIAGNOSIS — I480 Paroxysmal atrial fibrillation: Secondary | ICD-10-CM

## 2016-09-21 DIAGNOSIS — I4891 Unspecified atrial fibrillation: Secondary | ICD-10-CM | POA: Diagnosis not present

## 2016-09-21 MED ORDER — IOPAMIDOL (ISOVUE-370) INJECTION 76%
INTRAVENOUS | Status: AC
  Administered 2016-09-21: 80 mL
  Filled 2016-09-21: qty 100

## 2016-09-21 MED ORDER — NITROGLYCERIN 0.4 MG SL SUBL
SUBLINGUAL_TABLET | SUBLINGUAL | Status: AC
  Filled 2016-09-21: qty 1

## 2016-09-21 MED ORDER — NITROGLYCERIN 0.4 MG SL SUBL
0.4000 mg | SUBLINGUAL_TABLET | Freq: Once | SUBLINGUAL | Status: AC
  Administered 2016-09-21: 0.4 mg via SUBLINGUAL
  Filled 2016-09-21: qty 25

## 2016-09-21 NOTE — Progress Notes (Signed)
CT scan completed. Tolerated well. D/C home with brother walking. Awake and alert. In no distress.

## 2016-09-27 ENCOUNTER — Telehealth: Payer: Self-pay | Admitting: Cardiology

## 2016-09-27 ENCOUNTER — Ambulatory Visit (INDEPENDENT_AMBULATORY_CARE_PROVIDER_SITE_OTHER): Payer: 59 | Admitting: Physician Assistant

## 2016-09-27 VITALS — BP 135/86 | HR 64 | Temp 97.6°F | Resp 16 | Ht 69.0 in | Wt 261.0 lb

## 2016-09-27 DIAGNOSIS — H00021 Hordeolum internum right upper eyelid: Secondary | ICD-10-CM

## 2016-09-27 DIAGNOSIS — H00012 Hordeolum externum right lower eyelid: Secondary | ICD-10-CM

## 2016-09-27 MED ORDER — POLYMYXIN B-TRIMETHOPRIM 10000-0.1 UNIT/ML-% OP SOLN: 1.0000 [drp] | OPHTHALMIC | 0 refills | Status: AC

## 2016-09-27 NOTE — Progress Notes (Signed)
Patient ID: Craig Roberson, male    DOB: March 19, 1956, 61 y.o.   MRN: [id number]  PCP: [name], PA-C  Chief Complaint  Patient presents with  . Belepharitis    been this way for 4 days,  matted this morning     Subjective:   Presents for evaluation of RIGHT eye pain and swelling x 4 days.  No history of FB or injury to eye. Eyelids are swollen and red, but the eye itself is not painful or red. Purulent drainage throughout the day, crusting on the lashes every morning. No visual changes. No fever, chills. No upper respiratory symptoms.  To have ablation tomorrow to address AFib.    Review of Systems As above.    Patient Active Problem List   Diagnosis Date Noted  . OSA (obstructive sleep apnea) 07/27/2015  . Snoring 05/19/2015  . Excessive daytime sleepiness 05/19/2015  . PAF (paroxysmal atrial fibrillation) ([place]) 04/15/2015  . Family history of premature CAD 04/06/2015  . Heart palpitations 04/06/2015  . Diabetes mellitus type 2, uncomplicated ([place]) 27/08/5007  . BMI 32.0-32.9,adult 09/15/2014  . Degenerative disc disease, lumbar 09/15/2014  . Obesity (BMI 30-39.9) 09/02/2013  . DJD (degenerative joint disease), multiple sites 02/18/2013  . Diverticulosis of sigmoid colon 08/20/2012  . AR (allergic rhinitis) 03/19/2012  . Mixed hyperlipidemia 03/19/2012  . Cardiac murmur   . ED (erectile dysfunction)   . Tinnitus of both ears   . Macular degeneration   . Hernia      Prior to Admission medications   Medication Sig Start Date End Date Taking? Authorizing Provider  atorvastatin (LIPITOR) 80 MG tablet Take 1 tablet (80 mg total) by mouth daily. Patient not taking: Reported on 09/27/2016 07/28/16   [name], PA-C  azelastine (ASTELIN) 0.1 % nasal spray Place 1 spray into both nostrils 2 (two) times daily as needed for rhinitis. Use in each nostril as directed    Historical Provider, MD  cholecalciferol (VITAMIN D) 1000 UNITS tablet Take 1,000  Units by mouth daily.    Historical Provider, MD  diazepam (VALIUM) 10 MG tablet TAKE 1 TABLET BY MOUTH EVERY 6 HOURS AS NEEDED 09/04/16   Mancel Bale, PA-C  ezetimibe (ZETIA) 10 MG tablet Take 1 tablet (10 mg total) by mouth daily. 07/28/16   Charlisa Cham, PA-C  ezetimibe-simvastatin (VYTORIN) 10-40 MG tablet Take 10-40 tablets by mouth once. 07/14/16   Historical Provider, MD  flecainide (TAMBOCOR) 50 MG tablet Take 50 mg by mouth 2 (two) times daily.  07/20/16   Historical Provider, MD  fluticasone (FLONASE) 50 MCG/ACT nasal spray Place 2 sprays into both nostrils daily. Patient taking differently: Place 2 sprays into both nostrils daily as needed for allergies.  10/05/15   Jaella Weinert, PA-C  metFORMIN (GLUCOPHAGE) 500 MG tablet Take 1 tablet (500 mg total) by mouth 2 (two) times daily with a meal. 07/25/16   Jett Fukuda, PA-C  metoprolol (LOPRESSOR) 50 MG tablet Take 1 tablet (50 mg total) by mouth 2 (two) times daily. 05/30/16 09/25/16  Bhavinkumar Bhagat, PA  Omega-3 Fatty Acids (FISH OIL) 1200 MG CAPS Take 1,200 mg by mouth daily. Reported on 10/08/2015    Historical Provider, MD  rivaroxaban (XARELTO) 20 MG TABS tablet Take 1 tablet (20 mg total) by mouth daily with supper. 05/30/16   Bhavinkumar Bhagat, PA  Saw Palmetto 450 MG CAPS Take 1 capsule by mouth daily.    Historical Provider, MD     Allergies  Allergen  Reactions  . Codeine Other (See Comments)    Dizziness; faint  . Hydrocodone-Acetaminophen     Unknown per pt   . Methocarbamol Other (See Comments)    HA, tinnitus  . Morphine And Related Other (See Comments)    Severe Headache  . Nsaids     BRBPR       Objective:  Physical Exam  Constitutional: He is oriented to person, place, and time. He appears well-developed and well-nourished. He is active and cooperative. No distress.  BP 135/86 (BP Location: Right Arm, Patient Position: Sitting)   Pulse 64   Temp 97.6 F (36.4 C) (Oral)   Resp 16   Ht 5\' 9"  (1.753 m)    Wt 261 lb (118.4 kg)   SpO2 95%   BMI 38.54 kg/m    HENT:  Head: Normocephalic and atraumatic.  Eyes: Conjunctivae and EOM are normal. Pupils are equal, round, and reactive to light. Lids are everted and swept, no foreign bodies found. Right eye exhibits discharge and hordeolum. Right eye exhibits no chemosis and no exudate. No foreign body present in the right eye. Left eye exhibits no chemosis, no discharge, no exudate and no hordeolum. No foreign body present in the left eye. No scleral icterus.    Verbal consent obtained.  The eye was anesthetized with 2 drops of proparacaine, and stained with fluorescein.  Lid everted. Examination under woods lamp does not reveal a foreign body or area of increased stain uptake. The eye was then irrigated copiously with saline.   Pulmonary/Chest: Effort normal.  Neurological: He is alert and oriented to person, place, and time.  Psychiatric: He has a normal mood and affect. His speech is normal and behavior is normal.        Visual Acuity Screening   Right eye Left eye Both eyes  Without correction: 20/20 20/20 20/20   With correction:          Assessment & Plan:   1. Hordeolum internum of right upper eyelid 2. Hordeolum externum of right lower eyelid Warm compresses. Antibiotic drops. RTC if not improving in 48 hours. - trimethoprim-polymyxin b (POLYTRIM) ophthalmic solution; Place 1 drop into the right eye every 4 (four) hours.  Dispense: 10 mL; Refill: 0    No Follow-up on file.   Fara Chute, PA-C [facility] [place]

## 2016-09-27 NOTE — Progress Notes (Signed)
THIS NOTE IS USED FOR EDUCATIONAL PURPOSES ONLY!!!   Name: Craig Roberson  DOB: 12-08-1955  Age: 61 y.o. Sex: male  CC:  Chief Complaint  Patient presents with  . Belepharitis    been this way for 4 days,  matted this morning     PCP: [name], PA-C  HPI: Paitent reports today for x4 days of R eye pain. He reports he was driving home from the beach when his symptoms began. He denies having an foreign bodies in his eye since his pain. He reports associated itching and pain with upward gaze of the eye. Denies recent changes in medications or illnesses. Does not use contacts. Reports thick yellow discharge from his right eye. Patient reports when he wakes up in the morning his right eyelid is shut closed because of crusting in his eye. He has tried using a warm compress. He reports he has an ablation tomorrow for A-fib and is concerned this will interefere with his procedude. Reports associated redness on his eyelid.  ROS:  Constitutional: Negative for activity change, appetite change, fatigue and unexpected weight change.  HENT: Negative for congestion, dental problem, ear pain, hearing loss, mouth sores, postnasal drip, rhinorrhea, sneezing, sore throat, tinnitus and trouble swallowing.   Eyes: Negative for photophobia, and visual disturbance.  Respiratory: Negative for cough, chest tightness and shortness of breath.   Cardiovascular: Negative for chest pain, palpitations and leg swelling.  Skin: Negative for rash.  Neurological: Negative for dizziness, speech difficulty, weakness, light-headedness, numbness and headaches.  Hematological: Negative for adenopathy.    PMH:  Patient Active Problem List   Diagnosis Date Noted  . OSA (obstructive sleep apnea) 07/27/2015  . Snoring 05/19/2015  . Excessive daytime sleepiness 05/19/2015  . PAF (paroxysmal atrial fibrillation) ([place]) 04/15/2015  . Family history of premature CAD 04/06/2015  . Heart palpitations 04/06/2015  .  Diabetes mellitus type 2, uncomplicated ([place]) 06/28/7251  . BMI 32.0-32.9,adult 09/15/2014  . Degenerative disc disease, lumbar 09/15/2014  . Obesity (BMI 30-39.9) 09/02/2013  . DJD (degenerative joint disease), multiple sites 02/18/2013  . Diverticulosis of sigmoid colon 08/20/2012  . AR (allergic rhinitis) 03/19/2012  . Mixed hyperlipidemia 03/19/2012  . Cardiac murmur   . ED (erectile dysfunction)   . Tinnitus of both ears   . Macular degeneration   . Hernia     Allergies:  Allergies  Allergen Reactions  . Codeine Other (See Comments)    Dizziness; faint  . Hydrocodone-Acetaminophen     Unknown per pt   . Methocarbamol Other (See Comments)    HA, tinnitus  . Morphine And Related Other (See Comments)    Severe Headache  . Nsaids     BRBPR    Medications:  Current Outpatient Prescriptions on File Prior to Visit  Medication Sig Dispense Refill  . atorvastatin (LIPITOR) 80 MG tablet Take 1 tablet (80 mg total) by mouth daily. (Patient not taking: Reported on 09/27/2016) 90 tablet 3  . azelastine (ASTELIN) 0.1 % nasal spray Place 1 spray into both nostrils 2 (two) times daily as needed for rhinitis. Use in each nostril as directed    . cholecalciferol (VITAMIN D) 1000 UNITS tablet Take 1,000 Units by mouth daily.    . diazepam (VALIUM) 10 MG tablet TAKE 1 TABLET BY MOUTH EVERY 6 HOURS AS NEEDED 30 tablet 0  . ezetimibe (ZETIA) 10 MG tablet Take 1 tablet (10 mg total) by mouth daily. 90 tablet 3  . flecainide (TAMBOCOR) 50 MG tablet Take  50 mg by mouth 2 (two) times daily.     . fluticasone (FLONASE) 50 MCG/ACT nasal spray Place 2 sprays into both nostrils daily. (Patient taking differently: Place 2 sprays into both nostrils daily as needed for allergies. ) 16 g 12  . metFORMIN (GLUCOPHAGE) 500 MG tablet Take 1 tablet (500 mg total) by mouth 2 (two) times daily with a meal. 180 tablet 3  . metoprolol (LOPRESSOR) 50 MG tablet Take 1 tablet (50 mg total) by mouth 2 (two) times  daily. 180 tablet 3  . Omega-3 Fatty Acids (FISH OIL) 1200 MG CAPS Take 1,200 mg by mouth daily. Reported on 10/08/2015    . rivaroxaban (XARELTO) 20 MG TABS tablet Take 1 tablet (20 mg total) by mouth daily with supper. 90 tablet 2  . Saw Palmetto 450 MG CAPS Take 1 capsule by mouth daily.     No current facility-administered medications on file prior to visit.     PE:  GS: WDWN male sitting on exam table in NAD.  Vitals: BP 135/86 (BP Location: Right Arm, Patient Position: Sitting)   Pulse 64   Temp 97.6 F (36.4 C) (Oral)   Resp 16   Ht 5\' 9"  (1.753 m)   Wt 261 lb (118.4 kg)   SpO2 95%   BMI 38.54 kg/m  HEENT: Normocephalic, atruamatic.Eyes without ttp. Hordeolum located on the upper and lower eye lid. Upper eyelid erythematous. Yellow crusting on the right eyelashes.  PEARRL. Negative fluorescein exam.  No cervical lymphadenopathy.   Cardiovascular: RRR. No S3 or S4. No murmurs, rubs, or gallops. Pulses 2+ and equal bilateral in the upper and lower extremities. No pitting edema. No varicosities, clubbing, or cyanosis.  Pulm: CTA bilaterally. No expiratory muscle use while breathing.  Neuro: CN 2-12 grossly intact.  Psych: A&O x 4. Mood and affect appropriate for situation.  Skin: Warm and dry. No rashes or excoriations on exposed skin.   A&P:  1. Hordeolum internum of right upper eyelid 2. Hordeolum externum of right lower eyelid - controlled  Plan: Pharm:  trimethoprim-polymyxin b (POLYTRIM) ophthalmic solution Education: Continue warm compresses. Patient should be able to complete ablation tomorrow.        Respectfully,  Delilah Shan, PA-S2

## 2016-09-27 NOTE — Patient Instructions (Addendum)
IF you received an x-ray today, you will receive an invoice from [facility]. Please contact [facility] at [phone] with questions or concerns regarding your invoice.   IF you received labwork today, you will receive an invoice from [facility]. Please contact [facility] at [phone] with questions or concerns regarding your invoice.   Our billing staff will not be able to assist you with questions regarding bills from these companies.  You will be contacted with the lab results as soon as they are available. The fastest way to get your results is to activate your My Chart account. Instructions are located on the last page of this paperwork. If you have not heard from [place] regarding the results in 2 weeks, please contact this office.    Stye A stye is a bump on your eyelid caused by a bacterial infection. A stye can form inside the eyelid (internal stye) or outside the eyelid (external stye). An internal stye may be caused by an infected oil-producing gland inside your eyelid. An external stye may be caused by an infection at the base of your eyelash (hair follicle). Styes are very common. Anyone can get them at any age. They usually occur in just one eye, but you may have more than one in either eye. What are the causes? The infection is almost always caused by bacteria called Staphylococcus aureus. This is a common type of bacteria that lives on your skin. What increases the risk? You may be at higher risk for a stye if you have had one before. You may also be at higher risk if you have:  Diabetes.  Long-term illness.  Long-term eye redness.  A skin condition called seborrhea.  High fat levels in your blood (lipids). What are the signs or symptoms? Eyelid pain is the most common symptom of a stye. Internal styes are more painful than external styes. Other signs and symptoms may include:  Painful swelling of your eyelid.  A scratchy feeling in your  eye.  Tearing and redness of your eye.  Pus draining from the stye. How is this diagnosed? Your health care provider may be able to diagnose a stye just by examining your eye. The health care provider may also check to make sure:  You do not have a fever or other signs of a more serious infection.  The infection has not spread to other parts of your eye or areas around your eye. How is this treated? Most styes will clear up in a few days without treatment. In some cases, you may need to use antibiotic drops or ointment to prevent infection. Your health care provider may have to drain the stye surgically if your stye is:  Large.  Causing a lot of pain.  Interfering with your vision. This can be done using a thin blade or a needle. Follow these instructions at home:  Take medicines only as directed by your health care provider.  Apply a clean, warm compress to your eye for 10 minutes, 4 times a day.  Do not wear contact lenses or eye makeup until your stye has healed.  Do not try to pop or drain the stye. Contact a health care provider if:  You have chills or a fever.  Your stye does not go away after several days.  Your stye affects your vision.  Your eyeball becomes swollen, red, or painful. This information is not intended to replace advice given to you by your health care provider. Make sure  you discuss any questions you have with your health care provider. Document Released: 03/22/2005 Document Revised: 02/06/2016 Document Reviewed: 09/26/2013 Elsevier Interactive Patient Education  2017 [employer].

## 2016-09-27 NOTE — Telephone Encounter (Signed)
New Message  Pt voiced has questions about this procedure for tomorrow in regards to his medication.

## 2016-09-27 NOTE — Telephone Encounter (Signed)
Pt calling about medication instructions for procedure tomorrow.  Reviewed instructions in MyChart. Pt states understanding.

## 2016-09-28 ENCOUNTER — Ambulatory Visit (HOSPITAL_COMMUNITY): Payer: 59 | Admitting: Certified Registered Nurse Anesthetist

## 2016-09-28 ENCOUNTER — Encounter (HOSPITAL_COMMUNITY)
Admission: RE | Disposition: A | Discharge status: Home / Self Care | Payer: Self-pay | Source: Ambulatory Visit | Attending: Cardiology

## 2016-09-28 ENCOUNTER — Ambulatory Visit (HOSPITAL_COMMUNITY)
Admission: RE | Admit: 2016-09-28 | Discharge: 2016-09-29 | Disposition: A | Discharge status: Home / Self Care | Payer: 59 | Source: Ambulatory Visit | Attending: Cardiology | Admitting: Cardiology

## 2016-09-28 DIAGNOSIS — H3530 Unspecified macular degeneration: Secondary | ICD-10-CM | POA: Diagnosis not present

## 2016-09-28 DIAGNOSIS — Z7984 Long term (current) use of oral hypoglycemic drugs: Secondary | ICD-10-CM | POA: Diagnosis not present

## 2016-09-28 DIAGNOSIS — M1990 Unspecified osteoarthritis, unspecified site: Secondary | ICD-10-CM | POA: Diagnosis not present

## 2016-09-28 DIAGNOSIS — E669 Obesity, unspecified: Secondary | ICD-10-CM | POA: Insufficient documentation

## 2016-09-28 DIAGNOSIS — Z7901 Long term (current) use of anticoagulants: Secondary | ICD-10-CM | POA: Diagnosis not present

## 2016-09-28 DIAGNOSIS — E785 Hyperlipidemia, unspecified: Secondary | ICD-10-CM | POA: Insufficient documentation

## 2016-09-28 DIAGNOSIS — G4733 Obstructive sleep apnea (adult) (pediatric): Secondary | ICD-10-CM | POA: Insufficient documentation

## 2016-09-28 DIAGNOSIS — Z79899 Other long term (current) drug therapy: Secondary | ICD-10-CM | POA: Diagnosis not present

## 2016-09-28 DIAGNOSIS — I480 Paroxysmal atrial fibrillation: Secondary | ICD-10-CM | POA: Diagnosis not present

## 2016-09-28 DIAGNOSIS — E119 Type 2 diabetes mellitus without complications: Secondary | ICD-10-CM | POA: Diagnosis not present

## 2016-09-28 DIAGNOSIS — Z87891 Personal history of nicotine dependence: Secondary | ICD-10-CM | POA: Insufficient documentation

## 2016-09-28 DIAGNOSIS — I4891 Unspecified atrial fibrillation: Secondary | ICD-10-CM | POA: Diagnosis present

## 2016-09-28 DIAGNOSIS — I483 Typical atrial flutter: Secondary | ICD-10-CM | POA: Insufficient documentation

## 2016-09-28 HISTORY — PX: ATRIAL FIBRILLATION ABLATION: EP1191

## 2016-09-28 LAB — POCT ACTIVATED CLOTTING TIME
ACTIVATED CLOTTING TIME: 285 s
ACTIVATED CLOTTING TIME: 153 s
Activated Clotting Time: 312 seconds
Activated Clotting Time: 285 seconds
Activated Clotting Time: 329 seconds

## 2016-09-28 LAB — MRSA PCR SCREENING: MRSA by PCR: NEGATIVE

## 2016-09-28 LAB — GLUCOSE, CAPILLARY
GLUCOSE-CAPILLARY: 135 mg/dL — AB (ref 65–99)
GLUCOSE-CAPILLARY: 198 mg/dL — AB (ref 65–99)
Glucose-Capillary: 191 mg/dL — ABNORMAL HIGH (ref 65–99)

## 2016-09-28 SURGERY — ATRIAL FIBRILLATION ABLATION
Anesthesia: General

## 2016-09-28 MED ORDER — HEPARIN SODIUM (PORCINE) 1000 UNIT/ML IJ SOLN
INTRAMUSCULAR | Status: DC | PRN
  Administered 2016-09-28: 4000 [IU] via INTRAVENOUS
  Administered 2016-09-28: 1000 [IU] via INTRAVENOUS
  Administered 2016-09-28: 3000 [IU] via INTRAVENOUS
  Administered 2016-09-28: 15000 [IU] via INTRAVENOUS
  Administered 2016-09-28: 4000 [IU] via INTRAVENOUS

## 2016-09-28 MED ORDER — RIVAROXABAN 20 MG PO TABS
20.0000 mg | ORAL_TABLET | Freq: Every day | ORAL | Status: DC
Start: 2016-09-29 — End: 2016-09-28

## 2016-09-28 MED ORDER — SAW PALMETTO 450 MG PO CAPS: 1.0000 | ORAL_CAPSULE | Freq: Every day | ORAL | Status: DC

## 2016-09-28 MED ORDER — METFORMIN HCL 500 MG PO TABS
500.0000 mg | ORAL_TABLET | Freq: Two times a day (BID) | ORAL | Status: DC
  Administered 2016-09-29: 500 mg via ORAL
  Filled 2016-09-28: qty 1

## 2016-09-28 MED ORDER — LIDOCAINE HCL 2 % IJ SOLN: 0.1000 mL | Freq: Once | INTRAMUSCULAR | Status: DC

## 2016-09-28 MED ORDER — VITAMIN D 1000 UNITS PO TABS: 1000.0000 [IU] | ORAL_TABLET | Freq: Every day | ORAL | Status: DC

## 2016-09-28 MED ORDER — BUPIVACAINE HCL (PF) 0.25 % IJ SOLN
INTRAMUSCULAR | Status: DC | PRN
  Administered 2016-09-28: 30 mL

## 2016-09-28 MED ORDER — METOPROLOL TARTRATE 50 MG PO TABS: 50.0000 mg | ORAL_TABLET | Freq: Two times a day (BID) | ORAL | Status: DC

## 2016-09-28 MED ORDER — PROTAMINE SULFATE 10 MG/ML IV SOLN
INTRAVENOUS | Status: DC | PRN
  Administered 2016-09-28: 40 mg via INTRAVENOUS

## 2016-09-28 MED ORDER — ACETAMINOPHEN 325 MG PO TABS: 650.0000 mg | ORAL_TABLET | ORAL | Status: DC | PRN

## 2016-09-28 MED ORDER — DEXAMETHASONE SODIUM PHOSPHATE 10 MG/ML IJ SOLN
INTRAMUSCULAR | Status: DC | PRN
  Administered 2016-09-28: 10 mg via INTRAVENOUS

## 2016-09-28 MED ORDER — HEPARIN (PORCINE) IN NACL 2-0.9 UNIT/ML-% IJ SOLN
INTRAMUSCULAR | Status: DC | PRN
  Administered 2016-09-28: 1000 mL

## 2016-09-28 MED ORDER — ONDANSETRON HCL 4 MG/2ML IJ SOLN: 4.0000 mg | Freq: Once | INTRAMUSCULAR | Status: DC | PRN

## 2016-09-28 MED ORDER — HEPARIN SODIUM (PORCINE) 1000 UNIT/ML IJ SOLN
INTRAMUSCULAR | Status: DC | PRN
  Administered 2016-09-28: 2000 [IU] via INTRAVENOUS

## 2016-09-28 MED ORDER — AZELASTINE HCL 0.1 % NA SOLN
1.0000 | Freq: Two times a day (BID) | NASAL | Status: DC | PRN
  Filled 2016-09-28: qty 30

## 2016-09-28 MED ORDER — FENTANYL CITRATE (PF) 100 MCG/2ML IJ SOLN
INTRAMUSCULAR | Status: DC | PRN
  Administered 2016-09-28: 50 ug via INTRAVENOUS
  Administered 2016-09-28: 25 ug via INTRAVENOUS
  Administered 2016-09-28: 50 ug via INTRAVENOUS
  Administered 2016-09-28: 25 ug via INTRAVENOUS
  Administered 2016-09-28: 50 ug via INTRAVENOUS

## 2016-09-28 MED ORDER — FLECAINIDE ACETATE 50 MG PO TABS
50.0000 mg | ORAL_TABLET | Freq: Two times a day (BID) | ORAL | Status: DC
  Administered 2016-09-28: 50 mg via ORAL
  Filled 2016-09-28 (×2): qty 1

## 2016-09-28 MED ORDER — HEPARIN (PORCINE) IN NACL 2-0.9 UNIT/ML-% IJ SOLN
INTRAMUSCULAR | Status: AC
  Filled 2016-09-28: qty 500

## 2016-09-28 MED ORDER — SUGAMMADEX SODIUM 200 MG/2ML IV SOLN
INTRAVENOUS | Status: DC | PRN
  Administered 2016-09-28: 80 mg via INTRAVENOUS

## 2016-09-28 MED ORDER — SODIUM CHLORIDE 0.9% FLUSH: 3.0000 mL | INTRAVENOUS | Status: DC | PRN

## 2016-09-28 MED ORDER — HEPARIN (PORCINE) IN NACL 2-0.9 UNIT/ML-% IJ SOLN
INTRAMUSCULAR | Status: DC | PRN
  Administered 2016-09-28: 17:00:00

## 2016-09-28 MED ORDER — EZETIMIBE 10 MG PO TABS
10.0000 mg | ORAL_TABLET | Freq: Every day | ORAL | Status: DC
  Administered 2016-09-28: 10 mg via ORAL
  Filled 2016-09-28: qty 1

## 2016-09-28 MED ORDER — ONDANSETRON HCL 4 MG/2ML IJ SOLN: 4.0000 mg | Freq: Four times a day (QID) | INTRAMUSCULAR | Status: DC | PRN

## 2016-09-28 MED ORDER — LIDOCAINE 2% (20 MG/ML) 5 ML SYRINGE
INTRAMUSCULAR | Status: DC | PRN
  Administered 2016-09-28: 80 mg via INTRAVENOUS

## 2016-09-28 MED ORDER — SODIUM CHLORIDE 0.9% FLUSH: 3.0000 mL | Freq: Two times a day (BID) | INTRAVENOUS | Status: DC

## 2016-09-28 MED ORDER — ONDANSETRON HCL 4 MG/2ML IJ SOLN
INTRAMUSCULAR | Status: DC | PRN
  Administered 2016-09-28: 4 mg via INTRAVENOUS

## 2016-09-28 MED ORDER — SODIUM CHLORIDE 0.9 % IV SOLN
INTRAVENOUS | Status: DC | PRN
  Administered 2016-09-28 (×2): via INTRAVENOUS

## 2016-09-28 MED ORDER — HEPARIN SODIUM (PORCINE) 1000 UNIT/ML IJ SOLN
INTRAMUSCULAR | Status: AC
  Filled 2016-09-28: qty 1

## 2016-09-28 MED ORDER — MIDAZOLAM HCL 2 MG/2ML IJ SOLN
INTRAMUSCULAR | Status: DC | PRN
  Administered 2016-09-28: 2 mg via INTRAVENOUS

## 2016-09-28 MED ORDER — BUPIVACAINE HCL (PF) 0.25 % IJ SOLN
INTRAMUSCULAR | Status: AC
  Filled 2016-09-28: qty 30

## 2016-09-28 MED ORDER — POLYMYXIN B-TRIMETHOPRIM 10000-0.1 UNIT/ML-% OP SOLN
1.0000 [drp] | OPHTHALMIC | Status: DC
  Administered 2016-09-28 – 2016-09-29 (×3): 1 [drp] via OPHTHALMIC
  Filled 2016-09-28: qty 10

## 2016-09-28 MED ORDER — MEPERIDINE HCL 25 MG/ML IJ SOLN
12.5000 mg | INTRAMUSCULAR | Status: DC | PRN
  Filled 2016-09-28: qty 1

## 2016-09-28 MED ORDER — PROPOFOL 10 MG/ML IV BOLUS
INTRAVENOUS | Status: DC | PRN
  Administered 2016-09-28: 200 mg via INTRAVENOUS

## 2016-09-28 MED ORDER — PHENYLEPHRINE 40 MCG/ML (10ML) SYRINGE FOR IV PUSH (FOR BLOOD PRESSURE SUPPORT)
PREFILLED_SYRINGE | INTRAVENOUS | Status: DC | PRN
  Administered 2016-09-28 (×5): 120 ug via INTRAVENOUS

## 2016-09-28 MED ORDER — ROCURONIUM BROMIDE 10 MG/ML (PF) SYRINGE
PREFILLED_SYRINGE | INTRAVENOUS | Status: DC | PRN
  Administered 2016-09-28: 40 mg via INTRAVENOUS

## 2016-09-28 MED ORDER — DIAZEPAM 5 MG PO TABS: 10.0000 mg | ORAL_TABLET | Freq: Four times a day (QID) | ORAL | Status: DC | PRN

## 2016-09-28 MED ORDER — RIVAROXABAN 20 MG PO TABS: 20.0000 mg | ORAL_TABLET | Freq: Every day | ORAL | Status: DC

## 2016-09-28 MED ORDER — SODIUM CHLORIDE 0.9 % IV SOLN: 250.0000 mL | INTRAVENOUS | Status: DC | PRN

## 2016-09-28 MED ORDER — DEXTROSE 5 % IV SOLN
INTRAVENOUS | Status: DC | PRN
  Administered 2016-09-28: 25 ug/min via INTRAVENOUS

## 2016-09-28 MED ORDER — EPHEDRINE SULFATE 50 MG/ML IJ SOLN
INTRAMUSCULAR | Status: DC | PRN
  Administered 2016-09-28: 10 mg via INTRAVENOUS

## 2016-09-28 SURGICAL SUPPLY — 19 items
BAG SNAP BAND KOVER 36X36 (MISCELLANEOUS) ×3 IMPLANT
BLANKET WARM UNDERBOD FULL ACC (MISCELLANEOUS) ×3 IMPLANT
CATH SMTCH THERMOCOOL SF DF (CATHETERS) ×2 IMPLANT
CATH SOUNDSTAR 3D IMAGING (CATHETERS) ×2 IMPLANT
CATH VARIABLE LASSO NAV 2515 (CATHETERS) ×2 IMPLANT
CATH WEBSTER BI DIR CS D-F CRV (CATHETERS) ×2 IMPLANT
COVER SWIFTLINK CONNECTOR (BAG) ×3 IMPLANT
NDL TRANSSEPTAL BRK 98CM (NEEDLE) IMPLANT
NEEDLE TRANSSEPTAL BRK 98CM (NEEDLE) ×3 IMPLANT
PACK EP LATEX FREE (CUSTOM PROCEDURE TRAY) ×3
PACK EP LF (CUSTOM PROCEDURE TRAY) ×1 IMPLANT
PAD DEFIB LIFELINK (PAD) ×3 IMPLANT
PATCH CARTO3 (PAD) ×2 IMPLANT
SHEATH AGILIS NXT 8.5F 71CM (SHEATH) ×4 IMPLANT
SHEATH AVANTI 11F 11CM (SHEATH) ×2 IMPLANT
SHEATH PINNACLE 7F 10CM (SHEATH) ×2 IMPLANT
SHEATH PINNACLE 8F 10CM (SHEATH) ×4 IMPLANT
SHEATH PINNACLE 9F 10CM (SHEATH) ×4 IMPLANT
TUBING SMART ABLATE COOLFLOW (TUBING) ×2 IMPLANT

## 2016-09-28 NOTE — Anesthesia Preprocedure Evaluation (Addendum)
Anesthesia Evaluation    Airway Mallampati: II  TM Distance: >3 FB Neck ROM: Full    Dental  (+) Implants, Dental Advisory Given   Pulmonary sleep apnea and Continuous Positive Airway Pressure Ventilation , former smoker,    breath sounds clear to auscultation       Cardiovascular + Valvular Problems/Murmurs  Rhythm:Irregular Rate:Normal     Neuro/Psych    GI/Hepatic   Endo/Other  diabetes  Renal/GU      Musculoskeletal  (+) Arthritis ,   Abdominal   Peds  Hematology   Anesthesia Other Findings   Reproductive/Obstetrics                            Anesthesia Physical Anesthesia Plan  ASA: III  Anesthesia Plan: General   Post-op Pain Management:    Induction: Intravenous  Airway Management Planned: Oral ETT  Additional Equipment:   Intra-op Plan:   Post-operative Plan: Extubation in OR  Informed Consent: I have reviewed the patients History and Physical, chart, labs and discussed the procedure including the risks, benefits and alternatives for the proposed anesthesia with the patient or authorized representative who has indicated his/her understanding and acceptance.   Dental advisory given  Plan Discussed with: CRNA  Anesthesia Plan Comments: (Stress test negative 2/18 FBPZ(0258): - Left ventricle: The cavity size was normal. There was mild focal   basal hypertrophy of the septum. Systolic function was normal.   The estimated ejection fraction was in the range of 55% to 60%.   Wall motion was normal; there were no regional wall motion   abnormalities. Left ventricular diastolic function parameters   were normal. - Right ventricle: The cavity size was normal. Wall thickness was   normal. Systolic function was normal. - Atrial septum: No defect or patent foramen ovale was identified. - Tricuspid valve: There was no regurgitation. - Pulmonic valve: There was trivial  regurgitation. - Inferior vena cava: The vessel was normal in size. The   respirophasic diameter changes were in the normal range (>= 50%),   consistent with normal central venous pressure. 1. There is normal pulmonary vein drainage into the left atrium. STYE right eye  2. The left atrial appendage is has chicken wing morphology with one major lobe, ostial size 29 x 12 mm and length 40 mm. There is no thrombus in the left atrial appendage.  3. The esophagus runs to the left from the left atrial midline and is in the proximity to the ostia of the LUPV and LLPV.  4. Calcium score is 83 that represents 59 percentile for age/sex. Mild non-obstructive CAD in the proximal LAD, an aggressive risk factor modification is recommended.  5. Mildly dilated pulmonary artery measuring 30 x 27 mm.CT : )       Anesthesia Quick Evaluation

## 2016-09-28 NOTE — H&P (Signed)
ID:  Craig Roberson, DOB 1955/08/01, MRN [id number]  PCP:  [name], PA-C     Cardiologist:  [name] Primary Electrophysiologist:  Adiyah Lame Meredith Leeds, MD           Chief Complaint  Patient presents with  . New Patient (Initial Visit)    EP consult for Aflutter on monitor     History of Present Illness: Craig Roberson is a 61 y.o. male who presents today for electrophysiology evaluation.   He has a history of paroxsymal atrial fibrillation/flutter mild OSA, HLD and DM. An episode of A. fib with RVR in December after heavy caffeine use. He was started on Xarelto metoprolol at the time. When he was seen back in January, he was found to be in sinus rhythm with a rate of 63. He was continuing to have palpitations most days. He was started on flecainide 50 mg. He was seen again in February and was found to have intermittent breakthrough palpitations. His flecainide was increased to 100 mg twice a day but he did not tolerate this.   Says he went back out of rhythm when walking into the hospital.        Past Medical History:  Diagnosis Date  . Allergy   . Arthritis    bilateral thumbs  . BCC (basal cell carcinoma of skin) 04/2006   LEFT low back  . Cardiac murmur 1996   normal ECHO  . Diabetes mellitus without complication ([place])   . Diverticulosis   . ED (erectile dysfunction) 2006  . H/O ETOH abuse    Quit 2006  . Hernia    RIGHT inguinal  . Hyperlipidemia   . Macular degeneration   . PAF (paroxysmal atrial fibrillation) ([place]) 04/15/2015   During exercise myoview  . Tinnitus of both ears   . Ulcer [facility])         Past Surgical History:  Procedure Laterality Date  . GANGLION CYST EXCISION  1970's  . LUMBAR FUSION     L3-4; Dr. Ellene Route  . SPINE SURGERY    . THUMB FUSION     BILATERAL  . VASECTOMY  1976           Current Outpatient Prescriptions  Medication Sig Dispense Refill  . atorvastatin (LIPITOR) 80 MG tablet  Take 1 tablet (80 mg total) by mouth daily. 90 tablet 3  . azelastine (ASTELIN) 0.1 % nasal spray Place 1 spray into both nostrils 2 (two) times daily as needed for rhinitis. Use in each nostril as directed    . cholecalciferol (VITAMIN D) 1000 UNITS tablet Take 1,000 Units by mouth daily.    . diazepam (VALIUM) 10 MG tablet TAKE 1 TABLET BY MOUTH EVERY 6 HOURS AS NEEDED 30 tablet 0  . ezetimibe (ZETIA) 10 MG tablet Take 1 tablet (10 mg total) by mouth daily. 90 tablet 3  . flecainide (TAMBOCOR) 50 MG tablet     . fluticasone (FLONASE) 50 MCG/ACT nasal spray Place 2 sprays into both nostrils daily. 16 g 12  . guaiFENesin (MUCINEX) 600 MG 12 hr tablet Take 600 mg by mouth 2 (two) times daily as needed for cough or to loosen phlegm.    . metFORMIN (GLUCOPHAGE) 500 MG tablet Take 1 tablet (500 mg total) by mouth 2 (two) times daily with a meal. 180 tablet 3  . metoprolol (LOPRESSOR) 50 MG tablet Take 1 tablet (50 mg total) by mouth 2 (two) times daily. 180 tablet 3  . Omega-3 Fatty Acids (FISH  OIL) 1000 MG CAPS Take 12,000 mg by mouth daily. Reported on 10/08/2015    . rivaroxaban (XARELTO) 20 MG TABS tablet Take 1 tablet (20 mg total) by mouth daily with supper. 90 tablet 2   No current facility-administered medications for this visit.     Allergies:   Codeine; Hydrocodone-acetaminophen; Methocarbamol; Morphine and related; and Nsaids   Social History:  The patient  reports that he has quit smoking. He has quit using smokeless tobacco. His smokeless tobacco use included Chew. He reports that he does not drink alcohol or use drugs.   Family History:  The patient's family history includes Atrial fibrillation in his brother; Cancer in his father; Heart attack in his father; Heart disease (age of onset: 48) in his father; Heart disease (age of onset: 81) in his brother; Hyperlipidemia in his mother; Hypertension in his brother; Stroke in his father; Stroke (age of onset: 33) in his son;  Vascular Disease in his brother.    ROS:  Please see the history of present illness.   Otherwise, review of systems is positive for explained weight change, leg swelling, palpitations, hearing loss, back pain, muscle pain, joint swelling.   All other systems are reviewed and negative.    PHYSICAL EXAM: Vitals:   09/28/16 0923  BP: (!) 151/103  Pulse: (!) 144  Temp: 97.4 F (36.3 C)    GEN: Well nourished, well developed, in no acute distress  HEENT: normal  Neck: no JVD, carotid bruits, or masses Cardiac: iRRR; no murmurs, rubs, or gallops,no edema  Respiratory:  clear to auscultation bilaterally, normal work of breathing GI: soft, nontender, nondistended, + BS MS: no deformity or atrophy  Skin: warm and dry Neuro:  Strength and sensation are intact Psych: euthymic mood, full affect  EKG:  EKG is not ordered today. Personal review of the ekg ordered 07/27/16 shows sinus rhythm, rate 58, first-degree AV block  Recent Labs: 02/22/2016: ALT 18 06/27/2016: TSH 2.410 07/04/2016: BUN 11; Creatinine, Ser 0.92; Magnesium 2.0; Potassium 4.4; Sodium 138    Lipid Panel  Labs(Brief)          Component Value Date/Time   CHOL 182 07/25/2016 0842   TRIG 179 (H) 07/25/2016 0842   HDL 39 (L) 07/25/2016 0842   CHOLHDL 4.7 07/25/2016 0842   CHOLHDL 4.1 02/22/2016 1237   VLDL 25 02/22/2016 1237   LDLCALC 107 (H) 07/25/2016 0842          Wt Readings from Last 3 Encounters:  08/07/16 264 lb 6.4 oz (119.9 kg)  07/27/16 264 lb (119.7 kg)  07/25/16 263 lb (119.3 kg)      Other studies Reviewed: Additional studies/ records that were reviewed today include: TTE 04/22/15  Review of the above records today demonstrates:  - Left ventricle: The cavity size was normal. There was mild focal basal hypertrophy of the septum. Systolic function was normal. The estimated ejection fraction was in the range of 55% to 60%. Wall motion was normal; there were no regional  wall motion abnormalities. Left ventricular diastolic function parameters were normal. - Right ventricle: The cavity size was normal. Wall thickness was normal. Systolic function was normal. - Atrial septum: No defect or patent foramen ovale was identified. - Tricuspid valve: There was no regurgitation. - Pulmonic valve: There was trivial regurgitation. - Inferior vena cava: The vessel was normal in size. The respirophasic diameter changes were in the normal range (>= 50%), consistent with normal central venous pressure.   ASSESSMENT AND PLAN:  1.  Proximal atrial fibrillation/atrial flutter: Presents today for ablation of atrial fibrillation/flutter. Risks and benefits discussed. Risks include but not limited to bleeding, tamponade, heart block, stroke, and damage to surrouding organs. The patient understands these risks and has agreed to the procedure. His last dose of Xarelto was 30 hours ago.  This patients CHA2DS2-VASc Score and unadjusted Ischemic Stroke Rate (% per year) is equal to 0.6 % stroke rate/year from a score of 1  Above score calculated as 1 point each if present [CHF, HTN, DM, Vascular=MI/PAD/Aortic Plaque, Age if 65-74, or Male] Above score calculated as 2 points each if present [Age > 75, or Stroke/TIA/TE]  2. Hyperlipidemia: Continue atorvastatin and zetia.  3. OSA: Stress therapy for sleep apnea. Kinsie Belford refer to dental 4 an oral device.

## 2016-09-28 NOTE — Progress Notes (Signed)
Serial bedside TTE peoformed. Echo with trace pericardial effusion which was stable from the first to the second bedside echo. No tamponade physiology.  Will Curt Bears, MD 09/28/2016 7:21 PM

## 2016-09-28 NOTE — Anesthesia Procedure Notes (Signed)
Procedure Name: Intubation Date/Time: 09/28/2016 11:35 AM Performed by: Mervyn Gay Pre-anesthesia Checklist: Patient identified, Patient being monitored, Timeout performed, Emergency Drugs available and Suction available Patient Re-evaluated:Patient Re-evaluated prior to inductionOxygen Delivery Method: Circle System Utilized Preoxygenation: Pre-oxygenation with 100% oxygen Intubation Type: IV induction Ventilation: Mask ventilation without difficulty Laryngoscope Size: Miller and 3 Grade View: Grade III Tube type: Oral Tube size: 8.0 mm Number of attempts: 1 Airway Equipment and Method: Stylet Placement Confirmation: ETT inserted through vocal cords under direct vision,  positive ETCO2 and breath sounds checked- equal and bilateral Secured at: 23 cm Tube secured with: Tape Dental Injury: Teeth and Oropharynx as per pre-operative assessment

## 2016-09-28 NOTE — Anesthesia Postprocedure Evaluation (Addendum)
Anesthesia Post Note  Patient: Craig Roberson  Procedure(s) Performed: Procedure(s) (LRB): Atrial Fibrillation Ablation (N/A)  Patient location during evaluation: PACU Anesthesia Type: General Level of consciousness: sedated Pain management: pain level controlled Vital Signs Assessment: post-procedure vital signs reviewed and stable Respiratory status: spontaneous breathing and respiratory function stable Cardiovascular status: stable Anesthetic complications: no       Last Vitals:  Vitals:   09/28/16 1950 09/28/16 1951  BP: (!) 95/44 (!) 95/44  Pulse: 87 88  Resp: 18 15  Temp:      Last Pain:  Vitals:   09/28/16 1920  TempSrc: Temporal  PainSc:                  SINGER,JAMES DANIEL

## 2016-09-28 NOTE — Discharge Instructions (Signed)
No driving for 4 days. No lifting over 5 lbs for 1 week. No vigorous or sexual activity for 1 week. You may return to work on 10/05/16. Keep procedure site clean & dry. If you notice increased pain, swelling, bleeding or pus, call/return!  You may shower, but no soaking baths/hot tubs/pools for 1 week.    You have an appointment set up with the [facility].  Multiple studies have shown that being followed by a dedicated atrial fibrillation clinic in addition to the standard care you receive from your other physicians improves health. We believe that enrollment in the atrial fibrillation clinic will allow [place] to better care for you.   The phone number to the [facility] is [phone]. The clinic is staffed Monday through Friday from 8:30am to 5pm.  Parking Directions: The clinic is located in the Heart and Vascular Building connected to [facility]. 1)From [address] turn on to [address] and go to the 3rd entrance  (Heart and Vascular entrance) on the right. 2)Look to the right for Heart &Vascular Parking Garage. 3)A code for the entrance is required please call the clinic to receive this.   4)Take the elevators to the 1st floor. Registration is in the room with the glass walls at the end of the hallway.  If you have any trouble parking or locating the clinic, please dont hesitate to call [phone].

## 2016-09-28 NOTE — Transfer of Care (Signed)
Immediate Anesthesia Transfer of Care Note  Patient: Craig Roberson  Procedure(s) Performed: Procedure(s): Atrial Fibrillation Ablation (N/A)  Patient Location: PACU and Cath Lab  Anesthesia Type:General  Level of Consciousness: awake, alert , oriented and patient cooperative  Airway & Oxygen Therapy: Patient Spontanous Breathing and Patient connected to face mask oxygen  Post-op Assessment: Report given to RN and Post -op Vital signs reviewed and stable  Post vital signs: Reviewed and stable   Patient complaining of nausea, left arm pain, slightly hypotensive.  And pale.  Phenylephrine boluses given and zofran given .  Dr Curt Bears and dr singer notified  Last Vitals:  Vitals:   09/28/16 0923  BP: (!) 151/103  Pulse: (!) 144  Temp: 36.3 C    Last Pain:  Vitals:   09/28/16 1814  TempSrc:   PainSc: 7          Complications: No apparent anesthesia complications

## 2016-09-28 NOTE — Progress Notes (Addendum)
Site area: LFV Site Prior to Removal:  Level 0 Pressure Applied For:20 min Manual:   yes Patient Status During Pull:  stable Post Pull Site:  Level 0 Post Pull Instructions Given: yes  Post Pull Pulses Present: palpable Dressing Applied:  tegaderm Bedrest begins @ 2040 till 0240 Comments: Craig Roberson

## 2016-09-28 NOTE — Progress Notes (Addendum)
Site area: RFV x 3 Site Prior to Removal:  Level 0 Pressure Applied For:20 min Manual:   yes Patient Status During Pull: stable  Post Pull Site:  Level 0 Post Pull Instructions Given: yes  Post Pull Pulses Present: palpable Dressing Applied:  tegaderm Bedrest begins @ see LFV removal note Comments: by Sherlyn Lick

## 2016-09-29 ENCOUNTER — Encounter (HOSPITAL_COMMUNITY): Payer: Self-pay | Admitting: *Deleted

## 2016-09-29 DIAGNOSIS — I483 Typical atrial flutter: Secondary | ICD-10-CM | POA: Diagnosis not present

## 2016-09-29 DIAGNOSIS — E669 Obesity, unspecified: Secondary | ICD-10-CM | POA: Diagnosis not present

## 2016-09-29 DIAGNOSIS — I480 Paroxysmal atrial fibrillation: Secondary | ICD-10-CM | POA: Diagnosis not present

## 2016-09-29 DIAGNOSIS — E119 Type 2 diabetes mellitus without complications: Secondary | ICD-10-CM | POA: Diagnosis not present

## 2016-09-29 LAB — POCT ACTIVATED CLOTTING TIME: Activated Clotting Time: 279 seconds

## 2016-09-29 MED ORDER — COLCHICINE 0.6 MG PO TABS: 0.6000 mg | ORAL_TABLET | Freq: Every day | ORAL | 0 refills | Status: DC

## 2016-09-29 MED ORDER — METOPROLOL TARTRATE 25 MG PO TABS: 25.0000 mg | ORAL_TABLET | Freq: Two times a day (BID) | ORAL | 3 refills | Status: DC

## 2016-09-29 MED ORDER — PNEUMOCOCCAL VAC POLYVALENT 25 MCG/0.5ML IJ INJ: 0.5000 mL | INJECTION | INTRAMUSCULAR | Status: DC

## 2016-09-29 NOTE — Progress Notes (Signed)
Pt discharge education complete. All questions answered. Pt demonstrated understanding. All IVs removed.

## 2016-09-29 NOTE — Discharge Summary (Signed)
ELECTROPHYSIOLOGY PROCEDURE DISCHARGE SUMMARY    Patient ID: Craig Roberson,  MRN: [id number], DOB/AGE: 07/18/1955 61 y.o.  Admit date: 09/28/2016 Discharge date: 09/29/2016  Primary Care Physician: [name], PA-C Primary Cardiologist: Dr. [name] Electrophysiologist: Dr. Curt Bears  Primary Discharge Diagnosis:  1. paraoxysmal AFib     CHA2DS2Vasc is 1, on Xarelto  Secondary Discharge Diagnosis:  1. DM 2. Obesity 3. HLD  Procedures This Admission:  1.  Electrophysiology study and radiofrequency catheter ablation on 09/28/16 by Dr Curt Bears   This study demonstrated  CONCLUSIONS: 1. Sinus rhythm upon presentation.   2. Rotational Angiography reveals a moderate sized left atrium with four separate pulmonary veins without evidence of pulmonary vein stenosis. 3. Successful electrical isolation and anatomical encircling of all four pulmonary veins with radiofrequency current.    4. Cavo-tricuspid isthmus ablation was performed with complete bidirectional isthmus block achieved.  5. Cardioversion out of atypical atrial flutter 6. No early apparent complications  Brief HPI: Craig Roberson is a 61 y.o. male with a history of paroxysmal atrial fibrillation.  They have failed medical therapy with flecainide. Risks, benefits, and alternatives to catheter ablation of atrial fibrillation were reviewed with the patient who wished to proceed.  The patient underwent cardiac CT prior to the procedure which demonstrated no LAA thrombus.    Hospital Course:  The patient was admitted and underwent EPS/RFCA of atrial fibrillation with details as outlined above.  He was monitored on telemetry overnight which demonstrated SR.  Groin was without complication on the day of discharge.  The patient was examined by Dr. Curt Bears and considered to be stable for discharge.  Shravan Salahuddin write for 2 weeks colchicine for his c/o pleuritic type chest discomfort, Elliana Bal down-titrate his metoprolol noting his SBP  low 100's.   Wound care and restrictions were reviewed with the patient.  The patient Inga Noller be seen back by Roderic Palau, NP in 4 weeks and Dr Curt Bears in 12 weeks for post ablation follow up.      Physical Exam: Vitals:   09/29/16 0600 09/29/16 0630 09/29/16 0700 09/29/16 0800  BP: (!) 88/58 (!) 103/59 (!) 90/59 95/61  Pulse: 76 71 71 69  Resp: 19 19 20 18   Temp:   97.8 F (36.6 C)   TempSrc:   Oral   SpO2: 96% 96% 97% 97%  Weight:      Height:        GEN- The patient is well appearing, alert and oriented x 3 today.   HEENT: normocephalic, atraumatic; sclera clear, conjunctiva pink; hearing intact; oropharynx clear; neck supple  Lungs- CTA b/l, normal work of breathing.  No wheezes, rales, rhonchi Heart- RRR, no murmurs, rubs or gallops  GI- soft, non-tender, non-distended, bowel sounds present  Extremities- no clubbing, cyanosis, or edema; DP/PT/radial pulses 2+ bilaterally, groin without hematoma/bruit MS- no significant deformity or atrophy Skin- warm and dry, no rash or lesion Psych- euthymic mood, full affect Neuro- strength and sensation are intact   Labs:   Lab Results  Component Value Date   WBC 8.4 09/15/2016   HGB 16.2 03/18/2014   HCT 43.0 09/15/2016   MCV 92 09/15/2016   PLT 338 09/15/2016   No results for input(s): NA, K, CL, CO2, BUN, CREATININE, CALCIUM, PROT, BILITOT, ALKPHOS, ALT, AST, GLUCOSE in the last 168 hours.  Invalid input(s): LABALBU   Discharge Medications:  Allergies as of 09/29/2016      Reactions   Codeine Other (See Comments)   Dizziness; faint  Hydrocodone-acetaminophen    Unknown per pt   Methocarbamol Other (See Comments)   HA, tinnitus   Morphine And Related Other (See Comments)   Severe Headache   Nsaids    BRBPR      Medication List    STOP taking these medications   ezetimibe-simvastatin 10-40 MG tablet Commonly known as:  VYTORIN     TAKE these medications   atorvastatin 80 MG tablet Commonly known as:   LIPITOR Take 1 tablet (80 mg total) by mouth daily.   azelastine 0.1 % nasal spray Commonly known as:  ASTELIN Place 1 spray into both nostrils 2 (two) times daily as needed for rhinitis. Use in each nostril as directed   cholecalciferol 1000 units tablet Commonly known as:  VITAMIN D Take 1,000 Units by mouth daily.   colchicine 0.6 MG tablet Take 1 tablet (0.6 mg total) by mouth daily.   diazepam 10 MG tablet Commonly known as:  VALIUM TAKE 1 TABLET BY MOUTH EVERY 6 HOURS AS NEEDED   ezetimibe 10 MG tablet Commonly known as:  ZETIA Take 1 tablet (10 mg total) by mouth daily.   Fish Oil 1200 MG Caps Take 1,200 mg by mouth daily. Reported on 10/08/2015   flecainide 50 MG tablet Commonly known as:  TAMBOCOR Take 50 mg by mouth 2 (two) times daily.   fluticasone 50 MCG/ACT nasal spray Commonly known as:  FLONASE Place 2 sprays into both nostrils daily. What changed:  when to take this  reasons to take this   metFORMIN 500 MG tablet Commonly known as:  GLUCOPHAGE Take 1 tablet (500 mg total) by mouth 2 (two) times daily with a meal.   metoprolol tartrate 25 MG tablet Commonly known as:  LOPRESSOR Take 1 tablet (25 mg total) by mouth 2 (two) times daily. What changed:  medication strength  how much to take   rivaroxaban 20 MG Tabs tablet Commonly known as:  XARELTO Take 1 tablet (20 mg total) by mouth daily with supper.   Saw Palmetto 450 MG Caps Take 1 capsule by mouth daily.   trimethoprim-polymyxin b ophthalmic solution Commonly known as:  POLYTRIM Place 1 drop into the right eye every 4 (four) hours.       Disposition:  Home Discharge Instructions    Diet - low sodium heart healthy    Complete by:  As directed    Increase activity slowly    Complete by:  As directed      Follow-up Information    [facility] Follow up on 10/26/2016.   Specialty:  Cardiology Why:  9:00AM Contact information: [address] [id number] [place] [phone]       Audria Takeshita Meredith Leeds, MD Follow up.   Specialty:  Cardiology Why:  You Carola Viramontes be called to schedule you to schedule a 3 month follow up visit with Dr. Joya San information: [place] [address] [phone]           Duration of Discharge Encounter: Greater than 30 minutes including physician time.  Venetia Night, PA-C 09/29/2016 8:34 AM   I have seen and examined this patient with Tommye Standard.  Agree with above, note added to reflect my findings.  On exam, RRR, no murmurs, lungs clear. Admitted to the hospital after atrial fibrillation ablation. Did have episodes of hypotension postop with out pericardial effusion noted. It was thought this was due to postop anesthesia effects. This  morning feeling well without major complaint. We'll plan for discharge with follow-up in clinic.    Reha Martinovich M. Zayden Hahne MD 09/29/2016 10:06 AM

## 2016-10-02 ENCOUNTER — Telehealth: Payer: Self-pay | Admitting: Cardiology

## 2016-10-02 NOTE — Telephone Encounter (Signed)
This procedure is outpatient with extended recovery. In the hospital for <24 hours. Stay overnight and home the next morning.

## 2016-10-02 NOTE — Telephone Encounter (Signed)
Informed patient that he was in the hospital overnight after his procedure and discharged the next day. Informed patient that usually if a patient is there in such a short amount of time (24 hours) that he is observation and not technically admitted. Will forward to Dr. Curt Bears for clarification.

## 2016-10-02 NOTE — Telephone Encounter (Signed)
New message    Pt is calling to find out if he was admitted to the hospital after his ablation. He was told yes but said his insurance company told him no.

## 2016-10-02 NOTE — Telephone Encounter (Signed)
Called patient and made him aware of Dr. Curt Bears message. Patient thanked me for my time and verbalized understanding.

## 2016-10-02 NOTE — Telephone Encounter (Signed)
Pt is aware that he can take the Zetia 10 mg , but not to take  the combination of  Ezetimibe (Zetia)-Simvastatin 10-40 mg. Pt verbalized understanding.

## 2016-10-02 NOTE — Telephone Encounter (Signed)
New message   Pt c/o medication issue:  1. Name of Medication: ezetimibe (ZETIA) 10 MG tablet  2. How are you currently taking this medication (dosage and times per day)? 10mg   3. Are you having a reaction (difficulty breathing--STAT)? NO  4. What is your medication issue? Paperwork says not to take this medication and pt states he has another paper that says TO take it. Does he take it or should he discontinue it? He states the paperwork he was given after the ablation is confusing.

## 2016-10-06 ENCOUNTER — Other Ambulatory Visit: Payer: Self-pay | Admitting: Physician Assistant

## 2016-10-06 DIAGNOSIS — H9313 Tinnitus, bilateral: Secondary | ICD-10-CM

## 2016-10-08 NOTE — Telephone Encounter (Signed)
Patient notified via My Chart.  Meds ordered this encounter  Medications  . diazepam (VALIUM) 10 MG tablet    Sig: TAKE 1 TABLET EVERY 6 HOURS AS NEEDED    Dispense:  30 tablet    Refill:  0    Not to exceed 5 additional fills before 03/03/2017.

## 2016-10-08 NOTE — Telephone Encounter (Signed)
09/04/16 last refill

## 2016-10-09 NOTE — Telephone Encounter (Signed)
rx called to [facility]

## 2016-10-24 ENCOUNTER — Ambulatory Visit (INDEPENDENT_AMBULATORY_CARE_PROVIDER_SITE_OTHER): Payer: 59 | Admitting: Physician Assistant

## 2016-10-24 ENCOUNTER — Encounter: Payer: Self-pay | Admitting: Physician Assistant

## 2016-10-24 VITALS — BP 124/76 | HR 69 | Temp 97.7°F | Resp 18 | Ht 69.0 in | Wt 255.4 lb

## 2016-10-24 DIAGNOSIS — E782 Mixed hyperlipidemia: Secondary | ICD-10-CM

## 2016-10-24 DIAGNOSIS — E119 Type 2 diabetes mellitus without complications: Secondary | ICD-10-CM | POA: Diagnosis not present

## 2016-10-24 LAB — COMPREHENSIVE METABOLIC PANEL
ALT: 19 IU/L (ref 0–44)
AST: 17 IU/L (ref 0–40)
Albumin/Globulin Ratio: 1.5 (ref 1.2–2.2)
Albumin: 4.4 g/dL (ref 3.6–4.8)
Alkaline Phosphatase: 112 IU/L (ref 39–117)
BUN/Creatinine Ratio: 10 (ref 10–24)
BUN: 9 mg/dL (ref 8–27)
Bilirubin Total: 0.3 mg/dL (ref 0.0–1.2)
CALCIUM: 9.2 mg/dL (ref 8.6–10.2)
CO2: 21 mmol/L (ref 18–29)
CREATININE: 0.93 mg/dL (ref 0.76–1.27)
Chloride: 101 mmol/L (ref 96–106)
GFR, EST AFRICAN AMERICAN: 101 mL/min/{1.73_m2} (ref >59)
GFR, EST NON AFRICAN AMERICAN: 88 mL/min/{1.73_m2} (ref >59)
Globulin, Total: 3 g/dL (ref 1.5–4.5)
Glucose: 110 mg/dL — ABNORMAL HIGH (ref 65–99)
Potassium: 4.8 mmol/L (ref 3.5–5.2)
SODIUM: 140 mmol/L (ref 134–144)
TOTAL PROTEIN: 7.4 g/dL (ref 6.0–8.5)

## 2016-10-24 LAB — HEMOGLOBIN A1C
Est. average glucose Bld gHb Est-mCnc: 157 mg/dL
HEMOGLOBIN A1C: 7.1 % — AB (ref 4.8–5.6)

## 2016-10-24 LAB — LIPID PANEL
CHOL/HDL RATIO: 3.8 ratio (ref 0.0–5.0)
Cholesterol, Total: 143 mg/dL (ref 100–199)
HDL: 38 mg/dL — AB (ref >39)
LDL CALC: 64 mg/dL (ref 0–99)
TRIGLYCERIDES: 205 mg/dL — AB (ref 0–149)
VLDL Cholesterol Cal: 41 mg/dL — ABNORMAL HIGH (ref 5–40)

## 2016-10-24 NOTE — Assessment & Plan Note (Signed)
Inadequate control 07/25/2016. Tolerating metformin. Await A1C. Will increase regimen if indicated.

## 2016-10-24 NOTE — Assessment & Plan Note (Addendum)
Await labs. Consider change from OTC fish oil to Lovaza, or adding Welchol or Niaspan, as indicated by results.

## 2016-10-24 NOTE — Patient Instructions (Addendum)
Please schedule your eye exam.    IF you received an x-ray today, you will receive an invoice from [facility]. Please contact [facility] at [phone] with questions or concerns regarding your invoice.   IF you received labwork today, you will receive an invoice from [facility]. Please contact [facility] at [phone] with questions or concerns regarding your invoice.   Our billing staff will not be able to assist you with questions regarding bills from these companies.  You will be contacted with the lab results as soon as they are available. The fastest way to get your results is to activate your My Chart account. Instructions are located on the last page of this paperwork. If you have not heard from [place] regarding the results in 2 weeks, please contact this office.

## 2016-10-24 NOTE — Progress Notes (Signed)
Patient ID: Craig Roberson, male    DOB: November 29, 1955, 61 y.o.   MRN: [id number]  PCP: [name], PA-C  Chief Complaint  Patient presents with  . Follow-up    Subjective:   Presents for evaluation of diabetes and lipids.  He is due for eye exam. Last labs 07/25/2016 A1C 7.9% TC 182 TG 179 HDL 39 LDL 107  Frequency of home glucose monitoring: none Sees a dentist Q6 months, eye specialist annually. Checks feet daily. Is current with influenza vaccine. Is current with pneumococcal vaccine. Received Zostavax. Has not received Shingrix.  Since our last visit he has continued to have episodic heart palpitations, and did not tolerate increased dose of flecainide. As such, he underwent ablation for AFib on 09/28/2016. Since then he has not experienced palpitations.  "My heart is running like a Swiss watch. It's like I can't even feel it. I feel better."  He was prescribed colchicine for possible gout flare of the RIGHT ankle, now resolved.  Hordeolum evaluated here by my colleague 4/04 is no longer painful, but the lumps are still present.    Review of Systems  Constitutional: Negative for activity change, appetite change, fatigue and unexpected weight change.  HENT: Negative for congestion, dental problem, ear pain, hearing loss, mouth sores, postnasal drip, rhinorrhea, sneezing, sore throat, tinnitus and trouble swallowing.   Eyes: Negative for photophobia, pain, redness and visual disturbance.  Respiratory: Negative for apnea, cough, choking, chest tightness, shortness of breath, wheezing and stridor.   Cardiovascular: Negative for chest pain, palpitations and leg swelling.  Gastrointestinal: Negative for abdominal pain, blood in stool, constipation, diarrhea, nausea and vomiting.  Endocrine: Negative for cold intolerance, heat intolerance, polydipsia, polyphagia and polyuria.  Genitourinary: Negative for dysuria, frequency, hematuria and urgency.    Musculoskeletal: Negative for arthralgias, gait problem, myalgias and neck stiffness.  Skin: Negative for rash.  Neurological: Negative for dizziness, speech difficulty, weakness, light-headedness, numbness and headaches.  Hematological: Negative for adenopathy.  Psychiatric/Behavioral: Negative for confusion and sleep disturbance. The patient is not nervous/anxious.        Patient Active Problem List   Diagnosis Date Noted  . AF (atrial fibrillation) ([place]) 09/28/2016  . OSA (obstructive sleep apnea) 07/27/2015  . Snoring 05/19/2015  . Excessive daytime sleepiness 05/19/2015  . PAF (paroxysmal atrial fibrillation) ([place]) 04/15/2015  . Family history of premature CAD 04/06/2015  . Heart palpitations 04/06/2015  . Diabetes mellitus type 2, uncomplicated ([place]) 93/26/7124  . BMI 32.0-32.9,adult 09/15/2014  . Degenerative disc disease, lumbar 09/15/2014  . Obesity (BMI 30-39.9) 09/02/2013  . DJD (degenerative joint disease), multiple sites 02/18/2013  . Diverticulosis of sigmoid colon 08/20/2012  . AR (allergic rhinitis) 03/19/2012  . Mixed hyperlipidemia 03/19/2012  . Cardiac murmur   . ED (erectile dysfunction)   . Tinnitus of both ears   . Macular degeneration   . Hernia     Prior to Admission medications   Medication Sig Start Date End Date Taking? Authorizing Provider  azelastine (ASTELIN) 0.1 % nasal spray Place 1 spray into both nostrils 2 (two) times daily as needed for rhinitis. Use in each nostril as directed   Yes Historical Provider, MD  cholecalciferol (VITAMIN D) 1000 UNITS tablet Take 1,000 Units by mouth daily.   Yes Historical Provider, MD  diazepam (VALIUM) 10 MG tablet TAKE 1 TABLET EVERY 6 HOURS AS NEEDED 10/08/16  Yes Orian Amberg, PA-C  ezetimibe (ZETIA) 10 MG tablet Take 1 tablet (10 mg total) by mouth daily.  07/28/16  Yes Havanna Groner, PA-C  flecainide (TAMBOCOR) 50 MG tablet Take 50 mg by mouth 2 (two) times daily.  07/20/16  Yes Historical Provider, MD   fluticasone (FLONASE) 50 MCG/ACT nasal spray Place 2 sprays into both nostrils daily. Patient taking differently: Place 2 sprays into both nostrils daily as needed for allergies.  10/05/15  Yes Kyaire Gruenewald, PA-C  metFORMIN (GLUCOPHAGE) 500 MG tablet Take 1 tablet (500 mg total) by mouth 2 (two) times daily with a meal. 07/25/16  Yes Adilen Pavelko, PA-C  metoprolol (LOPRESSOR) 25 MG tablet Take 1 tablet (25 mg total) by mouth 2 (two) times daily. 09/29/16  Yes Renee Dyane Dustman, PA-C  Omega-3 Fatty Acids (FISH OIL) 1200 MG CAPS Take 1,200 mg by mouth daily. Reported on 10/08/2015   Yes Historical Provider, MD  rivaroxaban (XARELTO) 20 MG TABS tablet Take 1 tablet (20 mg total) by mouth daily with supper. 05/30/16  Yes Bhavinkumar Bhagat, PA  Saw Palmetto 450 MG CAPS Take 1 capsule by mouth daily.   Yes Historical Provider, MD  atorvastatin (LIPITOR) 80 MG tablet Take 1 tablet (80 mg total) by mouth daily. Patient not taking: Reported on 09/27/2016 07/28/16  Yes Cordae Mccarey, PA-C  colchicine 0.6 MG tablet Take 1 tablet (0.6 mg total) by mouth daily. 09/29/16 10/13/16 No Renee Dyane Dustman, PA-C       Allergies  Allergen Reactions  . Codeine Other (See Comments)    Dizziness; faint  . Hydrocodone-Acetaminophen     Unknown per pt   . Methocarbamol Other (See Comments)    HA, tinnitus  . Morphine And Related Other (See Comments)    Severe Headache  . Nsaids     BRBPR       Objective:  Physical Exam  Constitutional: He is oriented to person, place, and time. He appears well-developed and well-nourished. He is active and cooperative. No distress.  BP 124/76   Pulse 69   Temp 97.7 F (36.5 C) (Oral)   Resp 18   Ht 5\' 9"  (1.753 m)   Wt 255 lb 6.4 oz (115.8 kg)   SpO2 97%   BMI 37.72 kg/m   HENT:  Head: Normocephalic and atraumatic.  Right Ear: Hearing normal.  Left Ear: Hearing normal.  Eyes: Conjunctivae and EOM are normal. No scleral icterus.    Neck: Normal range of motion. Neck  supple. No thyromegaly present.  Cardiovascular: Normal rate, regular rhythm, normal heart sounds and intact distal pulses.   Pulses:      Radial pulses are 2+ on the right side, and 2+ on the left side.  Pulmonary/Chest: Effort normal and breath sounds normal.  Lymphadenopathy:       Head (right side): No tonsillar, no preauricular, no posterior auricular and no occipital adenopathy present.       Head (left side): No tonsillar, no preauricular, no posterior auricular and no occipital adenopathy present.    He has no cervical adenopathy.       Right: No supraclavicular adenopathy present.       Left: No supraclavicular adenopathy present.  Neurological: He is alert and oriented to person, place, and time. No sensory deficit.  Skin: Skin is warm, dry and intact. No rash noted. No cyanosis or erythema. Nails show no clubbing.  Psychiatric: He has a normal mood and affect. His speech is normal and behavior is normal.       Wt Readings from Last 3 Encounters:  10/24/16 255 lb 6.4 oz (115.8 kg)  09/28/16 269 lb 2.9 oz (122.1 kg)  09/27/16 261 lb (118.4 kg)       Assessment & Plan:   Problem List Items Addressed This Visit    Mixed hyperlipidemia (Chronic)    Await labs. Consider change from OTC fish oil to Lovaza, or adding Welchol or Niaspan, as indicated by results.       Relevant Orders   Lipid panel   Diabetes mellitus type 2, uncomplicated ([place]) - Primary    Inadequate control 07/25/2016. Tolerating metformin. Await A1C. Will increase regimen if indicated.      Relevant Orders   Hemoglobin A1c   Comprehensive metabolic panel   HM Diabetes Foot Exam (Completed)       Return in about 3 months (around 01/24/2017) for re-evaluation of diabetes and cholesterol.   Fara Chute, PA-C [facility]

## 2016-10-25 ENCOUNTER — Other Ambulatory Visit: Payer: Self-pay | Admitting: Physician Assistant

## 2016-10-25 MED ORDER — METFORMIN HCL 500 MG PO TABS: 1000.0000 mg | ORAL_TABLET | Freq: Two times a day (BID) | ORAL | 3 refills | Status: DC

## 2016-10-25 NOTE — Addendum Note (Signed)
Addended by: Fara Chute on: 10/25/2016 12:11 PM   Modules accepted: Orders

## 2016-10-26 ENCOUNTER — Encounter (HOSPITAL_COMMUNITY): Payer: Self-pay | Admitting: Nurse Practitioner

## 2016-10-26 ENCOUNTER — Ambulatory Visit (HOSPITAL_COMMUNITY)
Admission: RE | Admit: 2016-10-26 | Discharge: 2016-10-26 | Disposition: A | Discharge status: Home / Self Care | Payer: 59 | Source: Ambulatory Visit | Attending: Nurse Practitioner | Admitting: Nurse Practitioner

## 2016-10-26 VITALS — BP 116/72 | HR 71 | Ht 70.0 in | Wt 256.0 lb

## 2016-10-26 DIAGNOSIS — I480 Paroxysmal atrial fibrillation: Secondary | ICD-10-CM

## 2016-10-26 DIAGNOSIS — Z8249 Family history of ischemic heart disease and other diseases of the circulatory system: Secondary | ICD-10-CM | POA: Diagnosis not present

## 2016-10-26 DIAGNOSIS — Z888 Allergy status to other drugs, medicaments and biological substances status: Secondary | ICD-10-CM | POA: Insufficient documentation

## 2016-10-26 DIAGNOSIS — Z87891 Personal history of nicotine dependence: Secondary | ICD-10-CM | POA: Diagnosis not present

## 2016-10-26 DIAGNOSIS — Z85828 Personal history of other malignant neoplasm of skin: Secondary | ICD-10-CM | POA: Diagnosis not present

## 2016-10-26 DIAGNOSIS — E119 Type 2 diabetes mellitus without complications: Secondary | ICD-10-CM | POA: Diagnosis not present

## 2016-10-26 DIAGNOSIS — Z7901 Long term (current) use of anticoagulants: Secondary | ICD-10-CM | POA: Insufficient documentation

## 2016-10-26 DIAGNOSIS — E785 Hyperlipidemia, unspecified: Secondary | ICD-10-CM | POA: Insufficient documentation

## 2016-10-26 DIAGNOSIS — Z7984 Long term (current) use of oral hypoglycemic drugs: Secondary | ICD-10-CM | POA: Diagnosis not present

## 2016-10-26 DIAGNOSIS — Z823 Family history of stroke: Secondary | ICD-10-CM | POA: Diagnosis not present

## 2016-10-26 DIAGNOSIS — Z9889 Other specified postprocedural states: Secondary | ICD-10-CM | POA: Insufficient documentation

## 2016-10-26 DIAGNOSIS — Z79899 Other long term (current) drug therapy: Secondary | ICD-10-CM | POA: Diagnosis not present

## 2016-10-26 DIAGNOSIS — Z8042 Family history of malignant neoplasm of prostate: Secondary | ICD-10-CM | POA: Insufficient documentation

## 2016-10-26 DIAGNOSIS — I4891 Unspecified atrial fibrillation: Secondary | ICD-10-CM | POA: Diagnosis not present

## 2016-10-26 NOTE — Progress Notes (Signed)
Primary Care Physician: Craig Mons, PA-C Referring Physician: Dr. Andres Roberson Craig Roberson is a 61 y.o. male with a h/o afib s/p ablation 09/28/16 in the afib clinic for f/u. Pt is very happy with the procedure and had not had any further issues with afib. He denies swallowing or groin issues.  Today, he denies symptoms of palpitations, chest pain, shortness of breath, orthopnea, PND, lower extremity edema, dizziness, presyncope, syncope, or neurologic sequela. The patient is tolerating medications without difficulties and is otherwise without complaint today.   Past Medical History:  Diagnosis Date  . Allergy   . Arthritis    bilateral thumbs  . BCC (basal cell carcinoma of skin) 04/2006   LEFT low back  . Cardiac murmur 1996   normal ECHO  . Diabetes mellitus without complication ([place])   . Diverticulosis   . ED (erectile dysfunction) 2006  . H/O ETOH abuse    Quit 2006  . Hernia    RIGHT inguinal  . Hyperlipidemia   . Macular degeneration   . PAF (paroxysmal atrial fibrillation) ([place]) 04/15/2015   During exercise myoview  . Tinnitus of both ears   . Ulcer    Past Surgical History:  Procedure Laterality Date  . ATRIAL FIBRILLATION ABLATION N/A 09/28/2016   Procedure: Atrial Fibrillation Ablation;  Surgeon: Craig Craig Leeds, MD;  Location: [facility];  Service: Cardiovascular;  Laterality: N/A;  . GANGLION CYST EXCISION  1970's  . LUMBAR FUSION     L3-4; Dr. Ellene Roberson  . SPINE SURGERY    . THUMB FUSION     BILATERAL  . VASECTOMY  1976    Current Outpatient Prescriptions  Medication Sig Dispense Refill  . atorvastatin (LIPITOR) 80 MG tablet Take 1 tablet (80 mg total) by mouth daily. 90 tablet 3  . azelastine (ASTELIN) 0.1 % nasal spray Place 1 spray into both nostrils 2 (two) times daily as needed for rhinitis. Use in each nostril as directed    . cholecalciferol (VITAMIN D) 1000 UNITS tablet Take 1,000 Units by mouth daily.    . diazepam (VALIUM) 10  MG tablet TAKE 1 TABLET EVERY 6 HOURS AS NEEDED 30 tablet 0  . ezetimibe (ZETIA) 10 MG tablet Take 1 tablet (10 mg total) by mouth daily. 90 tablet 3  . flecainide (TAMBOCOR) 50 MG tablet Take 50 mg by mouth 2 (two) times daily.     . fluticasone (FLONASE) 50 MCG/ACT nasal spray Place 2 sprays into both nostrils daily. (Patient taking differently: Place 2 sprays into both nostrils daily as needed for allergies. ) 16 g 12  . metFORMIN (GLUCOPHAGE) 500 MG tablet Take 2 tablets (1,000 mg total) by mouth 2 (two) times daily with a meal. 360 tablet 3  . metoprolol (LOPRESSOR) 25 MG tablet Take 1 tablet (25 mg total) by mouth 2 (two) times daily. 60 tablet 3  . Omega-3 Fatty Acids (FISH OIL) 1200 MG CAPS Take 1,200 mg by mouth daily. Reported on 10/08/2015    . rivaroxaban (XARELTO) 20 MG TABS tablet Take 1 tablet (20 mg total) by mouth daily with supper. 90 tablet 2  . Saw Palmetto 450 MG CAPS Take 1 capsule by mouth daily.     No current facility-administered medications for this encounter.     Allergies  Allergen Reactions  . Codeine Other (See Comments)    Dizziness; faint  . Hydrocodone-Acetaminophen     Unknown per pt   . Methocarbamol Other (See Comments)  HA, tinnitus  . Morphine And Related Other (See Comments)    Severe Headache  . Nsaids     BRBPR    Social History   Social History  . Marital status: Married    Spouse name: Craig Roberson  . Number of children: 2  . Years of education: 8   Occupational History  . Retired Retired    Careers information officer   Social History Main Topics  . Smoking status: Former Research scientist (life sciences)  . Smokeless tobacco: Former Systems developer    Types: Chew     Comment: some days  . Alcohol use No  . Drug use: No  . Sexual activity: Not Currently    Partners: Female     Comment: family stress; his back pain have reduced the frequency   Other Topics Concern  . Not on file   Social History Narrative   Married to 3rd wife.    Family History  Problem  Relation Age of Onset  . Heart disease Brother 92    AMI, normal weight, normal cholesterol  . Heart disease Father 74  . Cancer Father     Prostate  . Stroke Father   . Heart attack Father   . Stroke Son 45  . Hyperlipidemia Mother   . Atrial fibrillation Brother   . Vascular Disease Brother     carotid artery disease  . Hypertension Brother     ROS- All systems are reviewed and negative except as per the HPI above  Physical Exam: Vitals:   10/26/16 0905  BP: 116/72  Pulse: 71  Weight: 256 lb (116.1 kg)  Height: 5\' 10"  (1.778 m)   Wt Readings from Last 3 Encounters:  10/26/16 256 lb (116.1 kg)  10/24/16 255 lb 6.4 oz (115.8 kg)  09/28/16 269 lb 2.9 oz (122.1 kg)    Labs: Lab Results  Component Value Date   NA 140 10/24/2016   K 4.8 10/24/2016   CL 101 10/24/2016   CO2 21 10/24/2016   GLUCOSE 110 (H) 10/24/2016   BUN 9 10/24/2016   CREATININE 0.93 10/24/2016   CALCIUM 9.2 10/24/2016   MG 2.0 07/04/2016   No results found for: INR Lab Results  Component Value Date   CHOL 143 10/24/2016   HDL 38 (L) 10/24/2016   LDLCALC 64 10/24/2016   TRIG 205 (H) 10/24/2016     GEN- The patient is well appearing, alert and oriented x 3 today.   Head- normocephalic, atraumatic Eyes-  Sclera clear, conjunctiva pink Ears- hearing intact Oropharynx- clear Neck- supple, no JVP Lymph- no cervical lymphadenopathy Lungs- Clear to ausculation bilaterally, normal work of breathing Heart- Regular rate and rhythm, no murmurs, rubs or gallops, PMI not laterally displaced GI- soft, NT, ND, + BS Extremities- no clubbing, cyanosis, or edema MS- no significant deformity or atrophy Skin- no rash or lesion Psych- euthymic mood, full affect Neuro- strength and sensation are intact  EKG-NSR at 71 bpm, pr int  176 ms, qrs int 86 ms, qtc 423 ms Epic records reviewed   Assessment and Plan: 1. Afib  S/p ablation Doing well without any afib occurrence Continue flecainide and  metoprolol Continue xarelto without fail for a chadsvasc score of 1(DM) Weight loss encouraged   f/u with Dr. Curt Roberson as scheduled  Craig Roberson, Craig Roberson [address] [place], [place] [address] [phone]

## 2016-11-06 ENCOUNTER — Other Ambulatory Visit: Payer: Self-pay | Admitting: Physician Assistant

## 2016-11-06 DIAGNOSIS — H9313 Tinnitus, bilateral: Secondary | ICD-10-CM

## 2016-11-08 NOTE — Telephone Encounter (Signed)
Script faxed to pharmacy

## 2016-11-30 ENCOUNTER — Other Ambulatory Visit: Payer: Self-pay | Admitting: Physician Assistant

## 2016-11-30 DIAGNOSIS — H9313 Tinnitus, bilateral: Secondary | ICD-10-CM

## 2016-12-01 MED ORDER — DIAZEPAM 10 MG PO TABS: 10.0000 mg | ORAL_TABLET | Freq: Four times a day (QID) | ORAL | 0 refills | Status: DC | PRN

## 2016-12-01 NOTE — Telephone Encounter (Signed)
Rx printed at 104. Will bring to 102 after clinic.  Meds ordered this encounter  Medications  . diazepam (VALIUM) 10 MG tablet    Sig: Take 1 tablet (10 mg total) by mouth every 6 (six) hours as needed.    Dispense:  30 tablet    Refill:  0    Not to exceed 5 additional fills before 04/07/2017.

## 2016-12-01 NOTE — Telephone Encounter (Signed)
11/07/16 last refill

## 2016-12-04 ENCOUNTER — Other Ambulatory Visit: Payer: Self-pay | Admitting: Physician Assistant

## 2016-12-04 DIAGNOSIS — H9313 Tinnitus, bilateral: Secondary | ICD-10-CM

## 2016-12-07 ENCOUNTER — Other Ambulatory Visit: Payer: Self-pay | Admitting: Physician Assistant

## 2016-12-07 DIAGNOSIS — H9313 Tinnitus, bilateral: Secondary | ICD-10-CM

## 2016-12-08 ENCOUNTER — Telehealth: Payer: Self-pay | Admitting: Physician Assistant

## 2016-12-08 NOTE — Telephone Encounter (Signed)
[facility] was just notified of a denied rx and that they should have received the rx on June 8th which they did not get and patient is out of meds  Believe it is for the diazepam the pharmacy did not give me name of meds   Best number for [facility] [phone]

## 2016-12-12 NOTE — Telephone Encounter (Signed)
Spoke with [facility], pt brought in RX today and it was filled

## 2017-01-02 ENCOUNTER — Ambulatory Visit (INDEPENDENT_AMBULATORY_CARE_PROVIDER_SITE_OTHER): Payer: 59 | Admitting: Cardiology

## 2017-01-02 ENCOUNTER — Encounter: Payer: Self-pay | Admitting: Cardiology

## 2017-01-02 VITALS — BP 118/80 | HR 61 | Ht 70.0 in | Wt 250.2 lb

## 2017-01-02 DIAGNOSIS — I480 Paroxysmal atrial fibrillation: Secondary | ICD-10-CM | POA: Diagnosis not present

## 2017-01-02 DIAGNOSIS — G4733 Obstructive sleep apnea (adult) (pediatric): Secondary | ICD-10-CM

## 2017-01-02 DIAGNOSIS — E782 Mixed hyperlipidemia: Secondary | ICD-10-CM | POA: Diagnosis not present

## 2017-01-02 NOTE — Patient Instructions (Signed)
Medication Instructions:   Your physician has recommended you make the following change in your medication:  1) STOP Xarelto  --- If you need a refill on your cardiac medications before your next appointment, please call your pharmacy. ---  Labwork:  None ordered  Testing/Procedures:  None ordered  Follow-Up:  Your physician recommends that you schedule a follow-up appointment in: 3 months with Dr. Camnitz.  Thank you for choosing [facility]!!   Harini Dearmond, RN [phone]         

## 2017-01-02 NOTE — Progress Notes (Signed)
Electrophysiology Office Note   Date:  01/02/2017   ID:  Craig Roberson, DOB September 18, 1955, MRN [id number]  PCP:  [name], PA-C  Cardiologist:  [name] Primary Electrophysiologist:  Will Meredith Leeds, MD    Chief Complaint  Patient presents with  . Follow-up    PAF/Typical Aflutter     History of Present Illness: Craig Roberson is a 61 y.o. male who presents today for electrophysiology evaluation.   He has a history of paroxsymal atrial fibrillation/flutter mild OSA, HLD and DM. An episode of A. fib with RVR in December after heavy caffeine use. He was started on Xarelto metoprolol at the time. When he was seen back in January, he was found to be in sinus rhythm with a rate of 63. He was continuing to have palpitations most days. He was started on flecainide 50 mg. He had an atrial fibrillation ablation on 09/28/16.   Today, denies symptoms of palpitations, chest pain, shortness of breath, orthopnea, PND, lower extremity edema, claudication, dizziness, presyncope, syncope, bleeding, or neurologic sequela. The patient is tolerating medications without difficulties and is otherwise without complaint today.   Past Medical History:  Diagnosis Date  . Allergy   . Arthritis    bilateral thumbs  . BCC (basal cell carcinoma of skin) 04/2006   LEFT low back  . Cardiac murmur 1996   normal ECHO  . Diabetes mellitus without complication ([place])   . Diverticulosis   . ED (erectile dysfunction) 2006  . H/O ETOH abuse    Quit 2006  . Hernia    RIGHT inguinal  . Hyperlipidemia   . Macular degeneration   . PAF (paroxysmal atrial fibrillation) ([name]) 04/15/2015   During exercise myoview  . Tinnitus of both ears   . Ulcer    Past Surgical History:  Procedure Laterality Date  . ATRIAL FIBRILLATION ABLATION N/A 09/28/2016   Procedure: Atrial Fibrillation Ablation;  Surgeon: Will Meredith Leeds, MD;  Location: [facility];  Service: Cardiovascular;  Laterality: N/A;  .  GANGLION CYST EXCISION  1970's  . LUMBAR FUSION     L3-4; Dr. Ellene Route  . SPINE SURGERY    . THUMB FUSION     BILATERAL  . VASECTOMY  1976     Current Outpatient Prescriptions  Medication Sig Dispense Refill  . atorvastatin (LIPITOR) 80 MG tablet Take 1 tablet (80 mg total) by mouth daily. 90 tablet 3  . azelastine (ASTELIN) 0.1 % nasal spray Place 1 spray into both nostrils 2 (two) times daily as needed for rhinitis. Use in each nostril as directed    . cholecalciferol (VITAMIN D) 1000 UNITS tablet Take 1,000 Units by mouth daily.    . diazepam (VALIUM) 10 MG tablet Take 1 tablet (10 mg total) by mouth every 6 (six) hours as needed. 30 tablet 0  . ezetimibe (ZETIA) 10 MG tablet Take 1 tablet (10 mg total) by mouth daily. 90 tablet 3  . flecainide (TAMBOCOR) 50 MG tablet Take 50 mg by mouth 2 (two) times daily.     . fluticasone (FLONASE) 50 MCG/ACT nasal spray Place 2 sprays into both nostrils daily. (Patient taking differently: Place 2 sprays into both nostrils daily as needed for allergies. ) 16 g 12  . metFORMIN (GLUCOPHAGE) 500 MG tablet Take 2 tablets (1,000 mg total) by mouth 2 (two) times daily with a meal. 360 tablet 3  . metoprolol (LOPRESSOR) 25 MG tablet Take 1 tablet (25 mg total) by mouth 2 (two) times  daily. 60 tablet 3  . Omega-3 Fatty Acids (FISH OIL) 1200 MG CAPS Take 1,200 mg by mouth daily. Reported on 10/08/2015    . Saw Palmetto 450 MG CAPS Take 1 capsule by mouth daily.     No current facility-administered medications for this visit.     Allergies:   Codeine; Hydrocodone-acetaminophen; Methocarbamol; Morphine and related; and Nsaids   Social History:  The patient  reports that he has quit smoking. He has quit using smokeless tobacco. His smokeless tobacco use included Chew. He reports that he does not drink alcohol or use drugs.   Family History:  The patient's family history includes Atrial fibrillation in his brother; Cancer in his father; Heart attack in his  father; Heart disease (age of onset: 39) in his father; Heart disease (age of onset: 69) in his brother; Hyperlipidemia in his mother; Hypertension in his brother; Stroke in his father; Stroke (age of onset: 65) in his son; Vascular Disease in his brother.    ROS:  Please see the history of present illness.   Otherwise, review of systems is positive for none.   All other systems are reviewed and negative.     PHYSICAL EXAM: VS:  BP 118/80   Pulse 61   Ht 5\' 10"  (1.778 m)   Wt 250 lb 3.2 oz (113.5 kg)   BMI 35.90 kg/m  , BMI Body mass index is 35.9 kg/m. GEN: Well nourished, well developed, in no acute distress  HEENT: normal  Neck: no JVD, carotid bruits, or masses Cardiac: RRR; no murmurs, rubs, or gallops,no edema  Respiratory:  clear to auscultation bilaterally, normal work of breathing GI: soft, nontender, nondistended, + BS MS: no deformity or atrophy  Skin: warm and dry Neuro:  Strength and sensation are intact Psych: euthymic mood, full affect  EKG:  EKG is ordered today. Personal review of the ekg ordered shows sinus rhythm, rate 61  Recent Labs: 06/27/2016: TSH 2.410 07/04/2016: Magnesium 2.0 09/15/2016: Hemoglobin 14.8; Platelets 338 10/24/2016: ALT 19; BUN 9; Creatinine, Ser 0.93; Potassium 4.8; Sodium 140    Lipid Panel     Component Value Date/Time   CHOL 143 10/24/2016 0945   TRIG 205 (H) 10/24/2016 0945   HDL 38 (L) 10/24/2016 0945   CHOLHDL 3.8 10/24/2016 0945   CHOLHDL 4.1 02/22/2016 1237   VLDL 25 02/22/2016 1237   LDLCALC 64 10/24/2016 0945     Wt Readings from Last 3 Encounters:  01/02/17 250 lb 3.2 oz (113.5 kg)  10/26/16 256 lb (116.1 kg)  10/24/16 255 lb 6.4 oz (115.8 kg)      Other studies Reviewed: Additional studies/ records that were reviewed today include: TTE 04/22/15  Review of the above records today demonstrates:  - Left ventricle: The cavity size was normal. There was mild focal   basal hypertrophy of the septum. Systolic function  was normal.   The estimated ejection fraction was in the range of 55% to 60%.   Wall motion was normal; there were no regional wall motion   abnormalities. Left ventricular diastolic function parameters   were normal. - Right ventricle: The cavity size was normal. Wall thickness was   normal. Systolic function was normal. - Atrial septum: No defect or patent foramen ovale was identified. - Tricuspid valve: There was no regurgitation. - Pulmonic valve: There was trivial regurgitation. - Inferior vena cava: The vessel was normal in size. The   respirophasic diameter changes were in the normal range (>= 50%),  consistent with normal central venous pressure.   ASSESSMENT AND PLAN:  1.  Proximal atrial fibrillation/atrial flutter: Had ablation 09/28/16. On Flecainide, metoprolol, Xarelto. Has not noted any further episodes of atrial fibrillation. We'll stop his Xarelto today due to his low stroke risk.  This patients CHA2DS2-VASc Score and unadjusted Ischemic Stroke Rate (% per year) is equal to 0.6 % stroke rate/year from a score of 1  Above score calculated as 1 point each if present [CHF, HTN, DM, Vascular=MI/PAD/Aortic Plaque, Age if 65-74, or Male] Above score calculated as 2 points each if present [Age > 75, or Stroke/TIA/TE]   2. Hyperlipidemia: Continue atorvastatin and that he  3. OSA: Stress compliance with sleep apnea therapy   Current medicines are reviewed at length with the patient today.   The patient does not have concerns regarding his medicines.  The following changes were made today:  Stop Xarelto  Labs/ tests ordered today include:  Orders Placed This Encounter  Procedures  . EKG 12-Lead     Disposition:   FU with Will Camnitz 3 months  Signed, Will Meredith Leeds, MD  01/02/2017 10:44 AM     [facility] [address] [place] [address] [phone] (office) [phone] (fax)

## 2017-01-03 ENCOUNTER — Telehealth: Payer: Self-pay | Admitting: *Deleted

## 2017-01-03 NOTE — Telephone Encounter (Signed)
Confirmation of referral  received from [facility] at [facility] office.

## 2017-01-03 NOTE — Telephone Encounter (Signed)
RE: Oral appliance  Received: Today  Message Contents  Sueanne Margarita, MD  Freada Bergeron, CMA        He should have been referred to Dr. Toy Cookey. Please find out if the referral was ever made and if not then make the referral   Traci TUrner   Previous Messages    ----- Message -----  From: Freada Bergeron, CMA  Sent: 01/02/2017  6:24 PM  To: Sueanne Margarita, MD  Subject: FW: Oral appliance

## 2017-01-03 NOTE — Telephone Encounter (Signed)
Referral made today to Dr Augustina Mood DDS, patient notified.

## 2017-01-03 NOTE — Telephone Encounter (Signed)
-----   Message from Stanton Kidney, RN sent at 01/02/2017  5:44 PM EDT ----- Regarding: Oral appliance Pt seen today in clinic w/ [place].   He mentions that he was told "a long time ago" that we would refer him to someone for an oral device" but he has never heard from anyone. Looks like he was seen 09/2015 w/ [facility] who mentions it in her assessment.  Please address and follow up with patient.  Thanks

## 2017-01-09 ENCOUNTER — Other Ambulatory Visit: Payer: Self-pay | Admitting: Physician Assistant

## 2017-01-09 DIAGNOSIS — H9313 Tinnitus, bilateral: Secondary | ICD-10-CM

## 2017-01-10 ENCOUNTER — Other Ambulatory Visit: Payer: Self-pay | Admitting: Physician Assistant

## 2017-01-10 DIAGNOSIS — H9313 Tinnitus, bilateral: Secondary | ICD-10-CM

## 2017-01-10 NOTE — Telephone Encounter (Signed)
Meds ordered this encounter  Medications  . diazepam (VALIUM) 10 MG tablet    Sig: TAKE 1 TABLET BY MOUTH EVERY 6 HOURS AS NEEDED    Dispense:  30 tablet    Refill:  0    Not to exceed 5 additional fills before 05/30/2017.

## 2017-01-11 ENCOUNTER — Telehealth: Payer: Self-pay

## 2017-01-11 NOTE — Telephone Encounter (Signed)
Script sent to pharmacy and received success notice.

## 2017-01-11 NOTE — Telephone Encounter (Signed)
Meds ordered this encounter  Medications  . diazepam (VALIUM) 10 MG tablet    Sig: TAKE 1 TABLET BY MOUTH EVERY 6 HOURS AS NEEDED    Dispense:  30 tablet    Refill:  0    Not to exceed 5 additional fills before 05/30/2017.

## 2017-01-16 ENCOUNTER — Telehealth: Payer: Self-pay

## 2017-01-16 DIAGNOSIS — E119 Type 2 diabetes mellitus without complications: Secondary | ICD-10-CM

## 2017-01-16 MED ORDER — FREESTYLE SYSTEM KIT: 1.0000 | PACK | 1 refills | Status: AC | PRN

## 2017-01-16 NOTE — Telephone Encounter (Signed)
This is a new type of glucometer.  Meds ordered this encounter  Medications  . glucose monitoring kit (FREESTYLE) monitoring kit    Sig: 1 each by Does not apply route as needed for other.    Dispense:  1 each    Refill:  [place] Reader    Order Specific Question:   Supervising Provider    Answer:   Brigitte Pulse, EVA N [[id number]]

## 2017-01-16 NOTE — Telephone Encounter (Signed)
Received fax from [place] requesting a new rx for [facility] Reader. I do not currently see this on medication list. Please advise

## 2017-01-17 ENCOUNTER — Telehealth: Payer: Self-pay | Admitting: Physician Assistant

## 2017-01-17 NOTE — Telephone Encounter (Signed)
THIS MESSAGE IS FROM [facility]: PLEASE SEE THE PHONE MESSAGE FROM TUES. (01/16/17) REGARDING THE METER. (THE ENCOUNTER HAS BEEN CLOSED SO I COULD NOT ATTACH THIS MESSAGE). [facility] NEED[facility] TO [facility] THE SENSORS THAT GO ALONG WITH THE METER, OTHERWISE THE PATIENT CAN NOT USE IT. BEST PHONE [phone] ([facility] [place]). [place]

## 2017-01-18 NOTE — Telephone Encounter (Signed)
Please advise 

## 2017-01-18 NOTE — Telephone Encounter (Signed)
FreeStyle [employer] UAD #9 (3 month supply, each sensor lasts 10 days), RF x 3

## 2017-01-26 ENCOUNTER — Other Ambulatory Visit: Payer: Self-pay | Admitting: Physician Assistant

## 2017-01-26 ENCOUNTER — Ambulatory Visit: Payer: 59 | Admitting: Physician Assistant

## 2017-01-29 DIAGNOSIS — E119 Type 2 diabetes mellitus without complications: Secondary | ICD-10-CM | POA: Diagnosis not present

## 2017-01-29 DIAGNOSIS — H524 Presbyopia: Secondary | ICD-10-CM | POA: Diagnosis not present

## 2017-01-29 LAB — HM DIABETES EYE EXAM

## 2017-02-01 DIAGNOSIS — Z23 Encounter for immunization: Secondary | ICD-10-CM | POA: Diagnosis not present

## 2017-02-02 ENCOUNTER — Telehealth: Payer: Self-pay | Admitting: Family Medicine

## 2017-02-02 DIAGNOSIS — H9313 Tinnitus, bilateral: Secondary | ICD-10-CM

## 2017-02-03 NOTE — Telephone Encounter (Signed)
Meds ordered this encounter  Medications  . diazepam (VALIUM) 10 MG tablet    Sig: TAKE 1 TABLET BY MOUTH EVERY 6 HOURS AS NEEDED    Dispense:  30 tablet    Refill:  0    Not to exceed 5 additional fills before 01/15/2019PATIENT IS OUT OF REFILLS FOR THIS MED.

## 2017-02-05 ENCOUNTER — Other Ambulatory Visit: Payer: Self-pay | Admitting: Family Medicine

## 2017-02-05 DIAGNOSIS — H9313 Tinnitus, bilateral: Secondary | ICD-10-CM

## 2017-02-06 ENCOUNTER — Other Ambulatory Visit: Payer: Self-pay | Admitting: Physician Assistant

## 2017-02-06 DIAGNOSIS — H9313 Tinnitus, bilateral: Secondary | ICD-10-CM

## 2017-02-07 NOTE — Telephone Encounter (Signed)
PATIENT IS CALLING TO SEE IF HIS DIAZEPAM (VALIUM) 10 MG IS READY TO BE PICKED UP. (IT LOOKS LIKE IT WAS APPROVED ON 02/03/17  BUT IT SAYS "PRINT.") SHANNON CHECKED THE PICK-UP FILE BUT IT WAS NOT THERE. I ASKED THE PATIENT TO CALL [facility] TO SEE IF IT HAD BEEN CALLED IN, BUT THE PHARMACIST TOLD HIM IT HAD NOT. (I COULD NOT REACH THE NURSES STATION). [place]

## 2017-02-07 NOTE — Telephone Encounter (Signed)
Medication called to [facility] pharmacy

## 2017-02-08 MED ORDER — DIAZEPAM 10 MG PO TABS: 10.0000 mg | ORAL_TABLET | Freq: Four times a day (QID) | ORAL | 0 refills | Status: DC | PRN

## 2017-02-08 NOTE — Telephone Encounter (Signed)
Called pt- - rx Valium at front desk 102 for pick up

## 2017-02-08 NOTE — Telephone Encounter (Signed)
Done

## 2017-02-16 ENCOUNTER — Telehealth: Payer: Self-pay | Admitting: Physician Assistant

## 2017-02-16 ENCOUNTER — Encounter: Payer: Self-pay | Admitting: Physician Assistant

## 2017-02-16 ENCOUNTER — Ambulatory Visit (INDEPENDENT_AMBULATORY_CARE_PROVIDER_SITE_OTHER): Payer: 59 | Admitting: Physician Assistant

## 2017-02-16 VITALS — BP 111/72 | HR 54 | Temp 97.3°F | Resp 18 | Ht 70.0 in | Wt 247.0 lb

## 2017-02-16 DIAGNOSIS — N529 Male erectile dysfunction, unspecified: Secondary | ICD-10-CM | POA: Diagnosis not present

## 2017-02-16 DIAGNOSIS — Z6835 Body mass index (BMI) 35.0-35.9, adult: Secondary | ICD-10-CM

## 2017-02-16 DIAGNOSIS — H01005 Unspecified blepharitis left lower eyelid: Secondary | ICD-10-CM | POA: Diagnosis not present

## 2017-02-16 DIAGNOSIS — E119 Type 2 diabetes mellitus without complications: Secondary | ICD-10-CM | POA: Diagnosis not present

## 2017-02-16 DIAGNOSIS — E782 Mixed hyperlipidemia: Secondary | ICD-10-CM

## 2017-02-16 MED ORDER — CIPROFLOXACIN HCL 0.3 % OP SOLN: 1.0000 [drp] | OPHTHALMIC | 0 refills | Status: DC

## 2017-02-16 NOTE — Assessment & Plan Note (Signed)
Await labs. Adjust regimen as indicated by results. Continue Lipitor, Zetia and fish oil.

## 2017-02-16 NOTE — Assessment & Plan Note (Signed)
Appears well controlled. Continue metformin and lifestyle changes. Await lab results.

## 2017-02-16 NOTE — Assessment & Plan Note (Signed)
Check testosterone level. If low, plan referral to endocrinology. If normal, would recommend urology referral to discuss options, but he is not interested in any kind of "device."

## 2017-02-16 NOTE — Telephone Encounter (Signed)
Please contact [facility] [phone]) to obtain last note, diabetic eye exam. Patient will also be contacting them due to increased redness and swelling of LEFT lower eyelid.

## 2017-02-16 NOTE — Progress Notes (Signed)
Patient ID: Craig Roberson, male    DOB: 10/11/1955, 61 y.o.   MRN: [id number]  PCP: [name], PA-C  Chief Complaint  Patient presents with  . Diabetes    Pt states sugars have been in the low 100s to 125s.  Marland Kitchen Hyperlipidemia  . Follow-up    3 months follow up, Pt states he had recent eye exam about a week ago.    Subjective:   Presents for evaluation of diabetes and cholesterol.  Home glucose is great. Avg readings (fasting) over the past week is 107. He is delighted with his new glucometer. Tolerating all his medications without adverse effects. No chest pain, SOB, HA, dizziness. No palpitations. Not fatigued.  Cardiology has discontinued the Xarelto. He isn't sure if he is to be taking an aspirin. He is to follow-up with Dr. Curt Bears in October.  Reports persistent ED. Did not previously tolerate Viagra, Cialis. "Face turned bright red. And I think one of my friends died taking that." Not interested in a pump.  LEFT eye blepharitis. Was seen here in April, and had 2 styes. He was prescribed drops, which he reports didn't help. He saw his eye specialist, who diagnosed blepharitis. Worsened since he saw his eye specialist. Now red and draining. Can't tell me his name or where the office is located, "I can get there, but I can't tell you the street name."  Arthritis pain is more of an issue since he stopped ibuprofen. He isn't interested in anything other than acetaminophen.  He received his seasonal flu vaccine and first dose of Shingrix 2 weeks ago at his pharmacy.   Review of Systems As above.    Patient Active Problem List   Diagnosis Date Noted  . AF (atrial fibrillation) ([place]) 09/28/2016  . OSA (obstructive sleep apnea) 07/27/2015  . Snoring 05/19/2015  . Excessive daytime sleepiness 05/19/2015  . PAF (paroxysmal atrial fibrillation) ([place]) 04/15/2015  . Family history of premature CAD 04/06/2015  . Heart palpitations 04/06/2015  . Diabetes  mellitus type 2, uncomplicated ([place]) 78/29/5621  . BMI 32.0-32.9,adult 09/15/2014  . Degenerative disc disease, lumbar 09/15/2014  . Obesity (BMI 30-39.9) 09/02/2013  . DJD (degenerative joint disease), multiple sites 02/18/2013  . Diverticulosis of sigmoid colon 08/20/2012  . AR (allergic rhinitis) 03/19/2012  . Mixed hyperlipidemia 03/19/2012  . Cardiac murmur   . ED (erectile dysfunction)   . Tinnitus of both ears   . Macular degeneration   . Hernia      Prior to Admission medications   Medication Sig Start Date End Date Taking? Authorizing Provider  atorvastatin (LIPITOR) 80 MG tablet Take 1 tablet (80 mg total) by mouth daily. 07/28/16  Yes Lamoine Magallon, PA-C  azelastine (ASTELIN) 0.1 % nasal spray Place 1 spray into both nostrils 2 (two) times daily as needed for rhinitis. Use in each nostril as directed   Yes [provider]  cholecalciferol (VITAMIN D) 1000 UNITS tablet Take 1,000 Units by mouth daily.   Yes [provider]  diazepam (VALIUM) 10 MG tablet Take 1 tablet (10 mg total) by mouth every 6 (six) hours as needed. 02/08/17  Yes Weber, Damaris Hippo, PA-C  ezetimibe (ZETIA) 10 MG tablet Take 1 tablet (10 mg total) by mouth daily. 07/28/16  Yes Melvin Marmo, PA-C  flecainide (TAMBOCOR) 50 MG tablet Take 50 mg by mouth 2 (two) times daily.  07/20/16  Yes [provider]  fluticasone (FLONASE) 50 MCG/ACT nasal spray Place 2 sprays into  both nostrils daily. Patient taking differently: Place 2 sprays into both nostrils daily as needed for allergies.  10/05/15  Yes Asante Blanda, PA-C  glucose monitoring kit (FREESTYLE) monitoring kit 1 each by Does not apply route as needed for other. 01/16/17  Yes Emnet Monk, PA-C  metFORMIN (GLUCOPHAGE) 500 MG tablet Take 2 tablets (1,000 mg total) by mouth 2 (two) times daily with a meal. 10/25/16  Yes Lashandra Arauz, PA-C  metoprolol tartrate (LOPRESSOR) 25 MG tablet TAKE 1 TABLET (25 MG TOTAL) BY MOUTH 2 (TWO) TIMES  DAILY. 01/26/17  Yes Camnitz, Ocie Doyne, MD  Omega-3 Fatty Acids (FISH OIL) 1200 MG CAPS Take 1,200 mg by mouth daily. Reported on 10/08/2015   Yes [provider]  Saw Palmetto 450 MG CAPS Take 1 capsule by mouth daily.   Yes [provider]     Allergies  Allergen Reactions  . Codeine Other (See Comments)    Dizziness; faint  . Hydrocodone-Acetaminophen     Unknown per pt   . Methocarbamol Other (See Comments)    HA, tinnitus  . Morphine And Related Other (See Comments)    Severe Headache  . Nsaids     BRBPR       Objective:  Physical Exam  Constitutional: He is oriented to person, place, and time. He appears well-developed and well-nourished. He is active and cooperative. No distress.  BP 111/72 (BP Location: Right Arm, Patient Position: Sitting, Cuff Size: Large)   Pulse (!) 54   Temp (!) 97.3 F (36.3 C) (Oral)   Resp 18   Ht '5\' 10"'  (1.778 m)   Wt 247 lb (112 kg)   SpO2 98%   BMI 35.44 kg/m   HENT:  Head: Normocephalic and atraumatic.  Right Ear: Hearing normal.  Left Ear: Hearing normal.  Eyes: Pupils are equal, round, and reactive to light. Conjunctivae and EOM are normal. No scleral icterus.    Neck: Normal range of motion. Neck supple. No thyromegaly present.  Cardiovascular: Normal rate, regular rhythm and normal heart sounds.   Pulses:      Radial pulses are 2+ on the right side, and 2+ on the left side.  Trace edema of the lower anterior tibias  Pulmonary/Chest: Effort normal and breath sounds normal.  Lymphadenopathy:       Head (right side): No tonsillar, no preauricular, no posterior auricular and no occipital adenopathy present.       Head (left side): No tonsillar, no preauricular, no posterior auricular and no occipital adenopathy present.    He has no cervical adenopathy.       Right: No supraclavicular adenopathy present.       Left: No supraclavicular adenopathy present.  Neurological: He is alert and oriented to person,  place, and time. No sensory deficit.  Skin: Skin is warm, dry and intact. No rash noted. No cyanosis or erythema. Nails show no clubbing.  Psychiatric: He has a normal mood and affect. His speech is normal and behavior is normal.       Assessment & Plan:   Problem List Items Addressed This Visit    Mixed hyperlipidemia (Chronic)    Await labs. Adjust regimen as indicated by results. Continue Lipitor, Zetia and fish oil.      Relevant Orders   Comprehensive metabolic panel   Lipid panel   ED (erectile dysfunction)    Check testosterone level. If low, plan referral to endocrinology. If normal, would recommend urology referral to discuss options, but he is  not interested in any kind of "device."      Relevant Orders   Testosterone   BMI 35.0-35.9,adult    He's gained some weight. Encouraged healthy eating and regular exercise (limited by osteoarthritis).      Diabetes mellitus type 2, uncomplicated ([place]) - Primary    Appears well controlled. Continue metformin and lifestyle changes. Await lab results.      Relevant Orders   Comprehensive metabolic panel   Hemoglobin A1c    Other Visit Diagnoses    Blepharitis of left lower eyelid, unspecified type       Advised him to contact his eye specialist TODAY. Also, let me know the specialist's name, so that I can get the notes from his last visit.   Relevant Medications   ciprofloxacin (CILOXAN) 0.3 % ophthalmic solution       Return in about 3 months (around 05/19/2017) for re-evaluation of diabetes, cholesterol.   Fara Chute, PA-C [facility] [place]

## 2017-02-16 NOTE — Patient Instructions (Signed)
     IF you received an x-ray today, you will receive an invoice from [facility]. Please contact [facility] at [phone] with questions or concerns regarding your invoice.   IF you received labwork today, you will receive an invoice from [facility]. Please contact [facility] at [phone] with questions or concerns regarding your invoice.   Our billing staff will not be able to assist you with questions regarding bills from these companies.  You will be contacted with the lab results as soon as they are available. The fastest way to get your results is to activate your My Chart account. Instructions are located on the last page of this paperwork. If you have not heard from us regarding the results in 2 weeks, please contact this office.     

## 2017-02-16 NOTE — Assessment & Plan Note (Signed)
He's gained some weight. Encouraged healthy eating and regular exercise (limited by osteoarthritis).

## 2017-02-17 LAB — HEMOGLOBIN A1C
ESTIMATED AVERAGE GLUCOSE: 148 mg/dL
HEMOGLOBIN A1C: 6.8 % — AB (ref 4.8–5.6)

## 2017-02-17 LAB — LIPID PANEL
Chol/HDL Ratio: 3.4 ratio (ref 0.0–5.0)
Cholesterol, Total: 134 mg/dL (ref 100–199)
HDL: 39 mg/dL — ABNORMAL LOW (ref >39)
LDL Calculated: 74 mg/dL (ref 0–99)
Triglycerides: 103 mg/dL (ref 0–149)
VLDL Cholesterol Cal: 21 mg/dL (ref 5–40)

## 2017-02-17 LAB — COMPREHENSIVE METABOLIC PANEL
A/G RATIO: 1.8 (ref 1.2–2.2)
ALT: 16 IU/L (ref 0–44)
AST: 19 IU/L (ref 0–40)
Albumin: 4.6 g/dL (ref 3.6–4.8)
Alkaline Phosphatase: 97 IU/L (ref 39–117)
BUN/Creatinine Ratio: 14 (ref 10–24)
BUN: 13 mg/dL (ref 8–27)
Bilirubin Total: 0.4 mg/dL (ref 0.0–1.2)
CALCIUM: 9.4 mg/dL (ref 8.6–10.2)
CO2: 21 mmol/L (ref 20–29)
Chloride: 105 mmol/L (ref 96–106)
Creatinine, Ser: 0.91 mg/dL (ref 0.76–1.27)
GFR calc Af Amer: 104 mL/min/{1.73_m2} (ref >59)
GFR, EST NON AFRICAN AMERICAN: 90 mL/min/{1.73_m2} (ref >59)
GLUCOSE: 115 mg/dL — AB (ref 65–99)
Globulin, Total: 2.5 g/dL (ref 1.5–4.5)
POTASSIUM: 4.7 mmol/L (ref 3.5–5.2)
Sodium: 142 mmol/L (ref 134–144)
Total Protein: 7.1 g/dL (ref 6.0–8.5)

## 2017-02-17 LAB — TESTOSTERONE: TESTOSTERONE: 368 ng/dL (ref 264–916)

## 2017-02-20 ENCOUNTER — Telehealth: Payer: Self-pay | Admitting: Physician Assistant

## 2017-02-20 DIAGNOSIS — R7989 Other specified abnormal findings of blood chemistry: Secondary | ICD-10-CM

## 2017-02-20 NOTE — Telephone Encounter (Signed)
[facility] for recent eye exam and they will be faxing over the last eye exam and notes.

## 2017-02-20 NOTE — Telephone Encounter (Signed)
I sent him a My Chart message asking him to let me know if he'd like a referral. I presume that this call indicates that he DOES want that. Referral placed now.

## 2017-02-20 NOTE — Telephone Encounter (Signed)
Pt called regarding a referral for testosterone.. I didn't see one that has been placed.   Please advise

## 2017-02-21 DIAGNOSIS — D225 Melanocytic nevi of trunk: Secondary | ICD-10-CM | POA: Diagnosis not present

## 2017-02-21 DIAGNOSIS — L570 Actinic keratosis: Secondary | ICD-10-CM | POA: Diagnosis not present

## 2017-02-21 DIAGNOSIS — L720 Epidermal cyst: Secondary | ICD-10-CM | POA: Diagnosis not present

## 2017-02-21 DIAGNOSIS — L821 Other seborrheic keratosis: Secondary | ICD-10-CM | POA: Diagnosis not present

## 2017-02-21 DIAGNOSIS — D485 Neoplasm of uncertain behavior of skin: Secondary | ICD-10-CM | POA: Diagnosis not present

## 2017-02-21 DIAGNOSIS — C490 Malignant neoplasm of connective and soft tissue of head, face and neck: Secondary | ICD-10-CM | POA: Diagnosis not present

## 2017-02-22 NOTE — Telephone Encounter (Signed)
Yes that's what the pt wants now.

## 2017-02-23 ENCOUNTER — Other Ambulatory Visit: Payer: Self-pay | Admitting: Physician Assistant

## 2017-02-23 DIAGNOSIS — N529 Male erectile dysfunction, unspecified: Secondary | ICD-10-CM

## 2017-02-23 NOTE — Progress Notes (Signed)
Endocrinologist declined referral due to normal (low normal) testosterone level. Referral to Urology to explore other treatment options.

## 2017-02-26 ENCOUNTER — Other Ambulatory Visit: Payer: Self-pay | Admitting: Physician Assistant

## 2017-02-26 DIAGNOSIS — H9313 Tinnitus, bilateral: Secondary | ICD-10-CM

## 2017-02-26 DIAGNOSIS — H01005 Unspecified blepharitis left lower eyelid: Secondary | ICD-10-CM

## 2017-02-27 ENCOUNTER — Other Ambulatory Visit: Payer: Self-pay | Admitting: Physician Assistant

## 2017-02-27 DIAGNOSIS — H9313 Tinnitus, bilateral: Secondary | ICD-10-CM

## 2017-03-02 NOTE — Telephone Encounter (Signed)
Please advise 

## 2017-03-03 NOTE — Telephone Encounter (Signed)
Meds ordered this encounter  Medications  . diazepam (VALIUM) 10 MG tablet    Sig: TAKE 1 TABLET BY MOUTH EVERY 6 HOURS AS NEEDED    Dispense:  30 tablet    Refill:  0    Not to exceed 5 additional fills before 08/06/2017.

## 2017-03-05 ENCOUNTER — Other Ambulatory Visit: Payer: Self-pay | Admitting: Physician Assistant

## 2017-03-05 DIAGNOSIS — H9313 Tinnitus, bilateral: Secondary | ICD-10-CM

## 2017-03-06 ENCOUNTER — Telehealth: Payer: Self-pay

## 2017-03-21 DIAGNOSIS — C44222 Squamous cell carcinoma of skin of right ear and external auricular canal: Secondary | ICD-10-CM | POA: Diagnosis not present

## 2017-03-25 ENCOUNTER — Other Ambulatory Visit: Payer: Self-pay | Admitting: Physician Assistant

## 2017-03-25 DIAGNOSIS — H9313 Tinnitus, bilateral: Secondary | ICD-10-CM

## 2017-03-26 ENCOUNTER — Other Ambulatory Visit: Payer: Self-pay | Admitting: Physician Assistant

## 2017-03-26 DIAGNOSIS — H9313 Tinnitus, bilateral: Secondary | ICD-10-CM

## 2017-03-26 NOTE — Telephone Encounter (Signed)
Please advise 

## 2017-03-27 ENCOUNTER — Telehealth: Payer: Self-pay

## 2017-03-27 NOTE — Telephone Encounter (Signed)
Meds ordered this encounter  Medications  . diazepam (VALIUM) 10 MG tablet    Sig: TAKE 1 TABLET BY MOUTH EVERY 6 HOURS AS NEEDED    Dispense:  30 tablet    Refill:  0    Not to exceed 5 additional fills before 09/02/2017.

## 2017-04-02 NOTE — Telephone Encounter (Signed)
PRESCRIPTION FOR VALIUM NEVER PICKED UP  SHREDDED

## 2017-04-04 ENCOUNTER — Encounter: Payer: Self-pay | Admitting: Cardiology

## 2017-04-04 ENCOUNTER — Ambulatory Visit (INDEPENDENT_AMBULATORY_CARE_PROVIDER_SITE_OTHER): Payer: 59 | Admitting: Cardiology

## 2017-04-04 VITALS — BP 126/84 | HR 64 | Ht 69.5 in | Wt 251.6 lb

## 2017-04-04 DIAGNOSIS — I480 Paroxysmal atrial fibrillation: Secondary | ICD-10-CM

## 2017-04-04 DIAGNOSIS — E785 Hyperlipidemia, unspecified: Secondary | ICD-10-CM

## 2017-04-04 DIAGNOSIS — G4733 Obstructive sleep apnea (adult) (pediatric): Secondary | ICD-10-CM | POA: Diagnosis not present

## 2017-04-04 NOTE — Progress Notes (Signed)
Electrophysiology Office Note   Date:  04/04/2017   ID:  Craig Roberson, DOB 03-10-1956, MRN [id number]  PCP:  [name], PA-C  Cardiologist:  [name] Primary Electrophysiologist:  [name], MD    Chief Complaint  Patient presents with  . Follow-up    PAF     History of Present Illness: Craig Roberson is a 61 y.o. male who presents today for electrophysiology evaluation.   He has a history of paroxsymal atrial fibrillation/flutter mild OSA, HLD and DM. An episode of A. fib with RVR in December after heavy caffeine use. He was started on Xarelto metoprolol at the time. When he was seen back in January, he was found to be in sinus rhythm with a rate of 63. He was continuing to have palpitations most days. He was started on flecainide 50 mg. he had an atrial fibrillation ablation on 09/28/16. He is continued on his flecainide since that time. He returns today for follow-up.   Today, denies symptoms of palpitations, chest pain, shortness of breath, orthopnea, PND, lower extremity edema, claudication, dizziness, presyncope, syncope, bleeding, or neurologic sequela. The patient is tolerating medications without difficulties. As noted a few episodes of palpitations that last for 1-2 beats. Otherwise he is feeling well. He is continuing to walk with his wife without issue. He is no longer having shortness of breath after his walks.   Past Medical History:  Diagnosis Date  . Allergy   . Arthritis    bilateral thumbs  . BCC (basal cell carcinoma of skin) 04/2006   LEFT low back  . Cardiac murmur 1996   normal ECHO  . Diabetes mellitus without complication ([place])   . Diverticulosis   . ED (erectile dysfunction) 2006  . H/O ETOH abuse    Quit 2006  . Hernia    RIGHT inguinal  . Hyperlipidemia   . Macular degeneration   . PAF (paroxysmal atrial fibrillation) ([place]) 04/15/2015   During exercise myoview  . Tinnitus of both ears   . Ulcer    Past Surgical  History:  Procedure Laterality Date  . ATRIAL FIBRILLATION ABLATION N/A 09/28/2016   Procedure: Atrial Fibrillation Ablation;  Surgeon: Teige Rountree Meredith Leeds, MD;  Location: [facility];  Service: Cardiovascular;  Laterality: N/A;  . GANGLION CYST EXCISION  1970's  . LUMBAR FUSION     L3-4; Dr. Ellene Route  . SPINE SURGERY    . THUMB FUSION     BILATERAL  . VASECTOMY  1976     Current Outpatient Prescriptions  Medication Sig Dispense Refill  . atorvastatin (LIPITOR) 80 MG tablet Take 1 tablet (80 mg total) by mouth daily. 90 tablet 3  . azelastine (ASTELIN) 0.1 % nasal spray Place 1 spray into both nostrils 2 (two) times daily as needed for rhinitis. Use in each nostril as directed    . cholecalciferol (VITAMIN D) 1000 UNITS tablet Take 1,000 Units by mouth daily.    . diazepam (VALIUM) 10 MG tablet TAKE 1 TABLET BY MOUTH EVERY 6 HOURS AS NEEDED 30 tablet 0  . ezetimibe (ZETIA) 10 MG tablet Take 1 tablet (10 mg total) by mouth daily. 90 tablet 3  . flecainide (TAMBOCOR) 50 MG tablet Take 50 mg by mouth 2 (two) times daily.     . fluticasone (FLONASE) 50 MCG/ACT nasal spray Place 2 sprays into both nostrils daily. (Patient taking differently: Place 2 sprays into both nostrils daily as needed for allergies. ) 16 g 12  .  glucose monitoring kit (FREESTYLE) monitoring kit 1 each by Does not apply route as needed for other. 1 each 1  . metFORMIN (GLUCOPHAGE) 500 MG tablet Take 2 tablets (1,000 mg total) by mouth 2 (two) times daily with a meal. 360 tablet 3  . metoprolol tartrate (LOPRESSOR) 25 MG tablet TAKE 1 TABLET (25 MG TOTAL) BY MOUTH 2 (TWO) TIMES DAILY. 60 tablet 6  . Omega-3 Fatty Acids (FISH OIL) 1200 MG CAPS Take 1,200 mg by mouth daily. Reported on 10/08/2015    . Saw Palmetto 450 MG CAPS Take 1 capsule by mouth daily.     No current facility-administered medications for this visit.     Allergies:   Codeine; Hydrocodone-acetaminophen; Methocarbamol; Morphine and related; and Nsaids    Social History:  The patient  reports that he has quit smoking. He has quit using smokeless tobacco. His smokeless tobacco use included Chew. He reports that he does not drink alcohol or use drugs.   Family History:  The patient's family history includes Atrial fibrillation in his brother; Cancer in his father; Heart attack in his father; Heart disease (age of onset: 13) in his father; Heart disease (age of onset: 78) in his brother; Hyperlipidemia in his mother; Hypertension in his brother; Stroke in his father; Stroke (age of onset: 56) in his son; Vascular Disease in his brother.    ROS:  Please see the history of present illness.   Otherwise, review of systems is positive for eye pain, muscle pain, joint swelling.   All other systems are reviewed and negative.   PHYSICAL EXAM: VS:  BP 126/84   Pulse 64   Ht 5' 9.5" (1.765 m)   Wt 251 lb 9.6 oz (114.1 kg)   SpO2 95%   BMI 36.62 kg/m  , BMI Body mass index is 36.62 kg/m. GEN: Well nourished, well developed, in no acute distress  HEENT: normal  Neck: no JVD, carotid bruits, or masses Cardiac: RRR; no murmurs, rubs, or gallops,no edema  Respiratory:  clear to auscultation bilaterally, normal work of breathing GI: soft, nontender, nondistended, + BS MS: no deformity or atrophy  Skin: warm and dry Neuro:  Strength and sensation are intact Psych: euthymic mood, full affect  EKG:  EKG is not ordered today. Personal review of the ekg ordered 01/02/17 shows SR, rate 61   Recent Labs: 06/27/2016: TSH 2.410 07/04/2016: Magnesium 2.0 09/15/2016: Hemoglobin 14.8; Platelets 338 02/16/2017: ALT 16; BUN 13; Creatinine, Ser 0.91; Potassium 4.7; Sodium 142    Lipid Panel     Component Value Date/Time   CHOL 134 02/16/2017 1103   TRIG 103 02/16/2017 1103   HDL 39 (L) 02/16/2017 1103   CHOLHDL 3.4 02/16/2017 1103   CHOLHDL 4.1 02/22/2016 1237   VLDL 25 02/22/2016 1237   LDLCALC 74 02/16/2017 1103     Wt Readings from Last 3 Encounters:    04/04/17 251 lb 9.6 oz (114.1 kg)  02/16/17 247 lb (112 kg)  01/02/17 250 lb 3.2 oz (113.5 kg)      Other studies Reviewed: Additional studies/ records that were reviewed today include: TTE 04/22/15  Review of the above records today demonstrates:  - Left ventricle: The cavity size was normal. There was mild focal   basal hypertrophy of the septum. Systolic function was normal.   The estimated ejection fraction was in the range of 55% to 60%.   Wall motion was normal; there were no regional wall motion   abnormalities. Left ventricular diastolic function  parameters   were normal. - Right ventricle: The cavity size was normal. Wall thickness was   normal. Systolic function was normal. - Atrial septum: No defect or patent foramen ovale was identified. - Tricuspid valve: There was no regurgitation. - Pulmonic valve: There was trivial regurgitation. - Inferior vena cava: The vessel was normal in size. The   respirophasic diameter changes were in the normal range (>= 50%),   consistent with normal central venous pressure.   ASSESSMENT AND PLAN:  1.  Proximal atrial fibrillation/atrial flutter: Ablation. 09/28/16. Currently on flecainide and metoprolol. Not anticoagulated due to low stroke risk. Has had minimal atrial fibrillation since ablation. No changes at this time.   This patients CHA2DS2-VASc Score and unadjusted Ischemic Stroke Rate (% per year) is equal to 0.6 % stroke rate/year from a score of 1  Above score calculated as 1 point each if present [CHF, HTN, DM, Vascular=MI/PAD/Aortic Plaque, Age if 65-74, or Male] Above score calculated as 2 points each if present [Age > 75, or Stroke/TIA/TE]   2. Hyperlipidemia: Continue atorvastatin  3. OSA: Stress compliance with CPAP   Current medicines are reviewed at length with the patient today.   The patient does not have concerns regarding his medicines.  The following changes were made today:  none  Labs/ tests ordered  today include:  No orders of the defined types were placed in this encounter.    Disposition:   FU with Claudell Rhody 6 months  Signed, Brittyn Salaz Meredith Leeds, MD  04/04/2017 9:46 AM     [facility] [address] [place] [address] [phone] (office) [phone] (fax)

## 2017-04-04 NOTE — Patient Instructions (Signed)
Medication Instructions:  Your physician recommends that you continue on your current medications as directed. Please refer to the Current Medication list given to you today.  If you need a refill on your cardiac medications before your next appointment, please call your pharmacy.   Labwork: None ordered  Testing/Procedures: None ordered  Follow-Up: Your physician wants you to follow-up in: 6 months  with Dr. Camnitz.  You will receive a reminder letter in the mail two months in advance. If you don't receive a letter, please call our office to schedule the follow-up appointment.  Thank you for choosing [facility]!!   Garvis Downum, RN [phone]  Any Other Special Instructions Will Be Listed Below (If Applicable).        

## 2017-04-21 ENCOUNTER — Other Ambulatory Visit: Payer: Self-pay | Admitting: Physician Assistant

## 2017-04-21 DIAGNOSIS — H9313 Tinnitus, bilateral: Secondary | ICD-10-CM

## 2017-04-22 ENCOUNTER — Other Ambulatory Visit: Payer: Self-pay | Admitting: Physician Assistant

## 2017-04-22 DIAGNOSIS — H9313 Tinnitus, bilateral: Secondary | ICD-10-CM

## 2017-04-23 NOTE — Telephone Encounter (Signed)
Please advise 

## 2017-04-24 ENCOUNTER — Telehealth: Payer: Self-pay

## 2017-04-24 NOTE — Telephone Encounter (Signed)
Meds ordered this encounter  Medications  . diazepam (VALIUM) 10 MG tablet    Sig: TAKE 1 TABLET BY MOUTH EVERY 6 HOURS AS NEEDED    Dispense:  30 tablet    Refill:  0    Not to exceed 5 additional fills before 09/23/2017.

## 2017-04-30 DIAGNOSIS — Z125 Encounter for screening for malignant neoplasm of prostate: Secondary | ICD-10-CM | POA: Diagnosis not present

## 2017-05-15 ENCOUNTER — Ambulatory Visit (INDEPENDENT_AMBULATORY_CARE_PROVIDER_SITE_OTHER): Payer: 59 | Admitting: Physician Assistant

## 2017-05-15 ENCOUNTER — Other Ambulatory Visit: Payer: Self-pay

## 2017-05-15 ENCOUNTER — Encounter: Payer: Self-pay | Admitting: Physician Assistant

## 2017-05-15 VITALS — BP 114/78 | HR 65 | Temp 97.9°F | Resp 18 | Ht 69.5 in | Wt 250.0 lb

## 2017-05-15 DIAGNOSIS — E782 Mixed hyperlipidemia: Secondary | ICD-10-CM

## 2017-05-15 DIAGNOSIS — E119 Type 2 diabetes mellitus without complications: Secondary | ICD-10-CM

## 2017-05-15 DIAGNOSIS — E669 Obesity, unspecified: Secondary | ICD-10-CM | POA: Diagnosis not present

## 2017-05-15 DIAGNOSIS — H9313 Tinnitus, bilateral: Secondary | ICD-10-CM | POA: Diagnosis not present

## 2017-05-15 LAB — CBC WITH DIFFERENTIAL/PLATELET
BASOS ABS: 0 10*3/uL (ref 0.0–0.2)
Basos: 0 %
EOS (ABSOLUTE): 0.3 10*3/uL (ref 0.0–0.4)
Eos: 3 %
Hematocrit: 42.4 % (ref 37.5–51.0)
Hemoglobin: 14.6 g/dL (ref 13.0–17.7)
IMMATURE GRANULOCYTES: 0 %
Immature Grans (Abs): 0 10*3/uL (ref 0.0–0.1)
Lymphocytes Absolute: 2.5 10*3/uL (ref 0.7–3.1)
Lymphs: 34 %
MCH: 31.3 pg (ref 26.6–33.0)
MCHC: 34.4 g/dL (ref 31.5–35.7)
MCV: 91 fL (ref 79–97)
MONOS ABS: 0.8 10*3/uL (ref 0.1–0.9)
Monocytes: 11 %
NEUTROS PCT: 52 %
Neutrophils Absolute: 3.8 10*3/uL (ref 1.4–7.0)
PLATELETS: 321 10*3/uL (ref 150–379)
RBC: 4.67 x10E6/uL (ref 4.14–5.80)
RDW: 13.8 % (ref 12.3–15.4)
WBC: 7.3 10*3/uL (ref 3.4–10.8)

## 2017-05-15 LAB — LIPID PANEL
CHOL/HDL RATIO: 4 ratio (ref 0.0–5.0)
Cholesterol, Total: 140 mg/dL (ref 100–199)
HDL: 35 mg/dL — ABNORMAL LOW (ref >39)
LDL CALC: 85 mg/dL (ref 0–99)
Triglycerides: 99 mg/dL (ref 0–149)
VLDL Cholesterol Cal: 20 mg/dL (ref 5–40)

## 2017-05-15 LAB — COMPREHENSIVE METABOLIC PANEL
A/G RATIO: 1.8 (ref 1.2–2.2)
ALT: 19 IU/L (ref 0–44)
AST: 21 IU/L (ref 0–40)
Albumin: 4.4 g/dL (ref 3.6–4.8)
Alkaline Phosphatase: 97 IU/L (ref 39–117)
BUN / CREAT RATIO: 9 — AB (ref 10–24)
BUN: 9 mg/dL (ref 8–27)
Bilirubin Total: 0.5 mg/dL (ref 0.0–1.2)
CALCIUM: 9.3 mg/dL (ref 8.6–10.2)
CHLORIDE: 103 mmol/L (ref 96–106)
CO2: 27 mmol/L (ref 20–29)
CREATININE: 0.99 mg/dL (ref 0.76–1.27)
GFR, EST AFRICAN AMERICAN: 94 mL/min/{1.73_m2} (ref >59)
GFR, EST NON AFRICAN AMERICAN: 81 mL/min/{1.73_m2} (ref >59)
GLUCOSE: 110 mg/dL — AB (ref 65–99)
Globulin, Total: 2.4 g/dL (ref 1.5–4.5)
POTASSIUM: 5 mmol/L (ref 3.5–5.2)
Sodium: 143 mmol/L (ref 134–144)
Total Protein: 6.8 g/dL (ref 6.0–8.5)

## 2017-05-15 LAB — HEMOGLOBIN A1C
Est. average glucose Bld gHb Est-mCnc: 154 mg/dL
Hgb A1c MFr Bld: 7 % — ABNORMAL HIGH (ref 4.8–5.6)

## 2017-05-15 MED ORDER — DIAZEPAM 10 MG PO TABS: 10.0000 mg | ORAL_TABLET | Freq: Four times a day (QID) | ORAL | 0 refills | Status: DC | PRN

## 2017-05-15 NOTE — Assessment & Plan Note (Signed)
Last A1c was 6.8%.  Continue current treatment.  Encouraged increased physical activity and healthy eating choices.  Eating at least small snacks during the day would benefit.

## 2017-05-15 NOTE — Patient Instructions (Signed)
     IF you received an x-ray today, you will receive an invoice from [facility]. Please contact [facility] at [phone] with questions or concerns regarding your invoice.   IF you received labwork today, you will receive an invoice from [facility]. Please contact [facility] at [phone] with questions or concerns regarding your invoice.   Our billing staff will not be able to assist you with questions regarding bills from these companies.  You will be contacted with the lab results as soon as they are available. The fastest way to get your results is to activate your My Chart account. Instructions are located on the last page of this paperwork. If you have not heard from us regarding the results in 2 weeks, please contact this office.     

## 2017-05-15 NOTE — Assessment & Plan Note (Signed)
Await lab results.  LDL goal is less than 70.

## 2017-05-15 NOTE — Assessment & Plan Note (Signed)
Chronic.  PRN diazepam use.

## 2017-05-15 NOTE — Assessment & Plan Note (Signed)
Resume regular exercise.  Healthy eating choices, including small snacks throughout the day.

## 2017-05-15 NOTE — Progress Notes (Signed)
Patient ID: Craig Roberson, male    DOB: 23-Dec-1955, 61 y.o.   MRN: [id number]  PCP: [name], PA-C  Chief Complaint  Patient presents with  . Hyperlipidemia  . Diabetes  . Follow-up    Subjective:   Presents for evaluation of hyperlipidemia, hypertension and diabetes.  Tolerating medications well. Loves the new glucometer, allowing him to scan his glucose at any time. 107 at this moment. Average glucose 122 for the past 90 days. "I've cheated a little, and eaten some candy."  Typical eating: Breakfast: biscuit and gravy, or bacon and egg biscuit No snack Lunch: skips, occasionally a piece of bread with peanut butter Snack: cookie or candy Supper: rotisserie chicken, pinto beans, greens beans or greens Snack: sometimes a piece of candy  Exercise: not been exercising like I should. Was walking every other day 30 minutes, almost 2 hours. Has been doing this since the time change, but he could do it during the day. Since the cardiac ablation, he can exercise without dyspnea, fatigue.  Saw urology. Good experience. Good report. Did not feel the hernia.  Tinnitus continues, worst when he is in a quiet environment.  Review of Systems  Constitutional: Negative for activity change, appetite change, fatigue and unexpected weight change.  HENT: Negative for congestion, dental problem, ear pain, hearing loss, mouth sores, postnasal drip, rhinorrhea, sneezing, sore throat, tinnitus and trouble swallowing.   Eyes: Negative for photophobia, pain, redness and visual disturbance.  Respiratory: Negative for cough, chest tightness and shortness of breath.   Cardiovascular: Negative for chest pain, palpitations and leg swelling.  Gastrointestinal: Negative for abdominal pain, blood in stool, constipation, diarrhea, nausea and vomiting.  Endocrine: Negative for cold intolerance, heat intolerance, polydipsia, polyphagia and polyuria.  Genitourinary: Negative for dysuria,  frequency, hematuria and urgency.  Musculoskeletal: Negative for arthralgias, gait problem, myalgias and neck stiffness.  Skin: Negative for rash.  Neurological: Negative for dizziness, speech difficulty, weakness, light-headedness, numbness and headaches.  Hematological: Negative for adenopathy.  Psychiatric/Behavioral: Negative for confusion and sleep disturbance. The patient is not nervous/anxious.        Patient Active Problem List   Diagnosis Date Noted  . AF (atrial fibrillation) ([place]) 09/28/2016  . OSA (obstructive sleep apnea) 07/27/2015  . Snoring 05/19/2015  . Excessive daytime sleepiness 05/19/2015  . PAF (paroxysmal atrial fibrillation) ([place]) 04/15/2015  . Family history of premature CAD 04/06/2015  . Heart palpitations 04/06/2015  . Diabetes mellitus type 2, uncomplicated ([place]) 54/49/2010  . BMI 35.0-35.9,adult 09/15/2014  . Degenerative disc disease, lumbar 09/15/2014  . Obesity (BMI 30-39.9) 09/02/2013  . DJD (degenerative joint disease), multiple sites 02/18/2013  . Diverticulosis of sigmoid colon 08/20/2012  . AR (allergic rhinitis) 03/19/2012  . Mixed hyperlipidemia 03/19/2012  . Cardiac murmur   . ED (erectile dysfunction)   . Tinnitus of both ears   . Macular degeneration   . Hernia      Prior to Admission medications   Medication Sig Start Date End Date Taking? Authorizing Provider  atorvastatin (LIPITOR) 80 MG tablet Take 1 tablet (80 mg total) by mouth daily. 07/28/16  Yes Thailyn Khalid, PA-C  azelastine (ASTELIN) 0.1 % nasal spray Place 1 spray into both nostrils 2 (two) times daily as needed for rhinitis. Use in each nostril as directed   Yes [provider]  cholecalciferol (VITAMIN D) 1000 UNITS tablet Take 1,000 Units by mouth daily.   Yes [provider]  diazepam (VALIUM) 10 MG tablet TAKE 1 TABLET  BY MOUTH EVERY 6 HOURS AS NEEDED 04/24/17  Yes Quinzell Malcomb, PA-C  ezetimibe (ZETIA) 10 MG tablet Take 1 tablet (10 mg total) by  mouth daily. 07/28/16  Yes Precious Gilchrest, PA-C  flecainide (TAMBOCOR) 50 MG tablet Take 50 mg by mouth 2 (two) times daily.  07/20/16  Yes [provider]  fluticasone (FLONASE) 50 MCG/ACT nasal spray Place 2 sprays into both nostrils daily. Patient taking differently: Place 2 sprays into both nostrils daily as needed for allergies.  10/05/15  Yes Zaineb Nowaczyk, PA-C  glucose monitoring kit (FREESTYLE) monitoring kit 1 each by Does not apply route as needed for other. 01/16/17  Yes Jlen Wintle, PA-C  metFORMIN (GLUCOPHAGE) 500 MG tablet Take 2 tablets (1,000 mg total) by mouth 2 (two) times daily with a meal. 10/25/16  Yes Kasten Leveque, PA-C  metoprolol tartrate (LOPRESSOR) 25 MG tablet TAKE 1 TABLET (25 MG TOTAL) BY MOUTH 2 (TWO) TIMES DAILY. 01/26/17  Yes Camnitz, Ocie Doyne, MD  Omega-3 Fatty Acids (FISH OIL) 1200 MG CAPS Take 1,200 mg by mouth daily. Reported on 10/08/2015   Yes [provider]  Saw Palmetto 450 MG CAPS Take 1 capsule by mouth daily.   Yes [provider]  sildenafil (REVATIO) 20 MG tablet TAKE 2-5 TABLETS BY MOUTH 1 HOUR BEFORE INTERCOURSE 04/30/17  Yes [provider]  Continuous Blood Gluc Sensor ([place]) MISC See admin instructions. 05/04/17   [provider]     Allergies  Allergen Reactions  . Codeine Other (See Comments)    Dizziness; faint  . Hydrocodone-Acetaminophen     Unknown per pt   . Methocarbamol Other (See Comments)    HA, tinnitus  . Morphine And Related Other (See Comments)    Severe Headache  . Nsaids     BRBPR       Objective:  Physical Exam  Constitutional: He is oriented to person, place, and time. He appears well-developed and well-nourished. He is active and cooperative. No distress.  BP 114/78 (BP Location: Left Arm, Patient Position: Sitting, Cuff Size: Normal)   Pulse 65   Temp 97.9 F (36.6 C) (Oral)   Resp 18   Ht 5' 9.5" (1.765 m)   Wt 250 lb (113.4 kg)    SpO2 97%   BMI 36.39 kg/m   HENT:  Head: Normocephalic and atraumatic.  Right Ear: Hearing normal.  Left Ear: Hearing normal.  Eyes: Conjunctivae are normal. No scleral icterus.  Neck: Normal range of motion. Neck supple. No thyromegaly present.  Cardiovascular: Normal rate, regular rhythm and normal heart sounds.  Pulses:      Radial pulses are 2+ on the right side, and 2+ on the left side.  Pulmonary/Chest: Effort normal and breath sounds normal.  Lymphadenopathy:       Head (right side): No tonsillar, no preauricular, no posterior auricular and no occipital adenopathy present.       Head (left side): No tonsillar, no preauricular, no posterior auricular and no occipital adenopathy present.    He has no cervical adenopathy.       Right: No supraclavicular adenopathy present.       Left: No supraclavicular adenopathy present.  Neurological: He is alert and oriented to person, place, and time. No sensory deficit.  Skin: Skin is warm, dry and intact. No rash noted. No cyanosis or erythema. Nails show no clubbing.  Psychiatric: He has a normal mood and affect. His speech is normal and behavior is normal.  Wt Readings from Last 3 Encounters:  05/15/17 250 lb (113.4 kg)  04/04/17 251 lb 9.6 oz (114.1 kg)  02/16/17 247 lb (112 kg)       Assessment & Plan:   Problem List Items Addressed This Visit    Mixed hyperlipidemia (Chronic)    Await lab results.  LDL goal is less than 70.      Relevant Medications   sildenafil (REVATIO) 20 MG tablet   Other Relevant Orders   Comprehensive metabolic panel   Lipid panel   Tinnitus of both ears    Chronic.  PRN diazepam use.      Obesity (BMI 30-39.9)    Resume regular exercise.  Healthy eating choices, including small snacks throughout the day.      Diabetes mellitus type 2, uncomplicated (HCC) - Primary    Last A1c was 6.8%.  Continue current treatment.  Encouraged increased physical activity and healthy eating choices.   Eating at least small snacks during the day would benefit.      Relevant Orders   CBC with Differential/Platelet   Comprehensive metabolic panel   Hemoglobin A1c   HM DIABETES EYE EXAM (Completed)    Other Visit Diagnoses    Tinnitus, bilateral       Relevant Medications   diazepam (VALIUM) 10 MG tablet       Return in about 3 months (around 08/15/2017) for re-evalaution of diabetes, blood pressure, cholesterol.   Fara Chute, PA-C [facility] [place]

## 2017-05-17 ENCOUNTER — Other Ambulatory Visit: Payer: Self-pay | Admitting: Physician Assistant

## 2017-05-17 DIAGNOSIS — H9313 Tinnitus, bilateral: Secondary | ICD-10-CM

## 2017-05-18 ENCOUNTER — Ambulatory Visit: Payer: 59 | Admitting: Physician Assistant

## 2017-05-29 ENCOUNTER — Other Ambulatory Visit: Payer: Self-pay | Admitting: Cardiology

## 2017-05-29 DIAGNOSIS — I480 Paroxysmal atrial fibrillation: Secondary | ICD-10-CM

## 2017-06-05 ENCOUNTER — Other Ambulatory Visit: Payer: Self-pay | Admitting: Physician Assistant

## 2017-06-12 ENCOUNTER — Other Ambulatory Visit: Payer: Self-pay | Admitting: Physician Assistant

## 2017-06-12 DIAGNOSIS — H9313 Tinnitus, bilateral: Secondary | ICD-10-CM

## 2017-06-12 MED ORDER — DIAZEPAM 10 MG PO TABS: 10.0000 mg | ORAL_TABLET | Freq: Four times a day (QID) | ORAL | 0 refills | Status: DC | PRN

## 2017-06-12 NOTE — Telephone Encounter (Signed)
Patient notified via My Chart.  Rx sent electronically.  Meds ordered this encounter  Medications  . diazepam (VALIUM) 10 MG tablet    Sig: Take 1 tablet (10 mg total) by mouth every 6 (six) hours as needed.    Dispense:  30 tablet    Refill:  0

## 2017-06-12 NOTE — Telephone Encounter (Signed)
Please advice  

## 2017-06-13 ENCOUNTER — Other Ambulatory Visit: Payer: Self-pay | Admitting: Physician Assistant

## 2017-06-13 DIAGNOSIS — E119 Type 2 diabetes mellitus without complications: Secondary | ICD-10-CM

## 2017-06-13 NOTE — Telephone Encounter (Signed)
Please advise. States in chart to discard remaining fills of 500 MG BID instructions.

## 2017-06-14 ENCOUNTER — Telehealth: Payer: Self-pay | Admitting: Physician Assistant

## 2017-06-14 DIAGNOSIS — E119 Type 2 diabetes mellitus without complications: Secondary | ICD-10-CM

## 2017-06-14 MED ORDER — METFORMIN HCL 500 MG PO TABS: 1000.0000 mg | ORAL_TABLET | Freq: Two times a day (BID) | ORAL | 3 refills | Status: DC

## 2017-06-14 NOTE — Telephone Encounter (Signed)
Looks like we have two requests. I have denied this one, as it is for the old dose.

## 2017-06-14 NOTE — Telephone Encounter (Signed)
Pt notified that prescription was going to be filled as requested.

## 2017-06-14 NOTE — Telephone Encounter (Signed)
Copied from [place] [id number]. Topic: Quick Communication - See Telephone Encounter >> Jun 14, 2017  8:13 AM Burnis Medin, NT wrote: CRM for notification. See Telephone encounter for: Luellen Pucker called in about refilling metFORMIN (GLUCOPHAGE) 500 MG tablet for pt. She said she has also faxed over a refill request and haven't heard back. She would like a call back or sent the prescription electronically.  06/14/17.

## 2017-06-14 NOTE — Telephone Encounter (Signed)
The correct dose was authorized today, by [employer].

## 2017-06-15 NOTE — Telephone Encounter (Signed)
1. Please remind Craig Roberson that we don't refill medications by fax any more, and requests need to be sent to [place] electronically. 2. We received 2 requests for the patient's metformin (see 2 other message threads). One was denied, as it was for the old dose, 500 mg BID. The other, for the corrrect dose of 1000 mg BID, was authorized by the [facility] (RN [employer]).

## 2017-06-15 NOTE — Telephone Encounter (Signed)
Patient states his dosage has double since last refill, would like to speak with someone to verify this. Call back [phone]

## 2017-06-15 NOTE — Telephone Encounter (Signed)
Forwarded to [employer].

## 2017-06-21 NOTE — Telephone Encounter (Signed)
Error-Sign Encounter 

## 2017-06-22 NOTE — Telephone Encounter (Signed)
Spoke with pharmacist and advised we do not respond to refills via fax any longer.

## 2017-06-25 NOTE — Telephone Encounter (Signed)
Pt picked up rx and it was for metformin 1000mg  BID and pt normally takes 500mg  BID.  Pt wants to make sure that this is correct. When he saw Chelle last, she told him that everything looked fine.  Please call pt at [phone]

## 2017-06-25 NOTE — Telephone Encounter (Signed)
Pt notified that the correct dose he should be taking daily is 1000mg  BID and that [employer], PA is aware that this is the new dose she wants him to take. Pt verbalized understanding.

## 2017-06-27 ENCOUNTER — Other Ambulatory Visit: Payer: Self-pay | Admitting: Physician Assistant

## 2017-06-27 DIAGNOSIS — E782 Mixed hyperlipidemia: Secondary | ICD-10-CM

## 2017-07-01 ENCOUNTER — Other Ambulatory Visit: Payer: Self-pay | Admitting: Physician Assistant

## 2017-07-01 DIAGNOSIS — E782 Mixed hyperlipidemia: Secondary | ICD-10-CM

## 2017-07-02 ENCOUNTER — Telehealth: Payer: Self-pay | Admitting: Physician Assistant

## 2017-07-02 NOTE — Telephone Encounter (Signed)
Copied from [place] [id number]. Topic: Inquiry >> Jul 02, 2017 10:40 AM Pricilla Handler wrote: Reason for CRM: Patient called requesting refills of Atorvastatin (LIPITOR) 80 MG tablet and Ezetimibe (ZETIA) 10 MG tablet. Patient's preferred pharmacy is  [id number] [address], [place] [phone] (Phone) [phone] (Fax)

## 2017-07-03 ENCOUNTER — Other Ambulatory Visit: Payer: Self-pay | Admitting: Physician Assistant

## 2017-07-03 DIAGNOSIS — R0981 Nasal congestion: Secondary | ICD-10-CM

## 2017-07-04 ENCOUNTER — Other Ambulatory Visit: Payer: Self-pay | Admitting: Physician Assistant

## 2017-07-04 DIAGNOSIS — H9313 Tinnitus, bilateral: Secondary | ICD-10-CM

## 2017-07-04 MED ORDER — DIAZEPAM 10 MG PO TABS: 10.0000 mg | ORAL_TABLET | Freq: Four times a day (QID) | ORAL | 0 refills | Status: DC | PRN

## 2017-07-04 NOTE — Telephone Encounter (Signed)
Rx sent electronically.  Meds ordered this encounter  Medications  . diazepam (VALIUM) 10 MG tablet    Sig: Take 1 tablet (10 mg total) by mouth every 6 (six) hours as needed.    Dispense:  30 tablet    Refill:  0

## 2017-07-25 ENCOUNTER — Other Ambulatory Visit: Payer: Self-pay | Admitting: Physician Assistant

## 2017-07-25 DIAGNOSIS — Z23 Encounter for immunization: Secondary | ICD-10-CM | POA: Diagnosis not present

## 2017-07-25 DIAGNOSIS — H9313 Tinnitus, bilateral: Secondary | ICD-10-CM

## 2017-07-26 ENCOUNTER — Other Ambulatory Visit: Payer: Self-pay | Admitting: Physician Assistant

## 2017-07-26 DIAGNOSIS — H9313 Tinnitus, bilateral: Secondary | ICD-10-CM

## 2017-07-26 NOTE — Telephone Encounter (Signed)
Copied from [place]. Topic: Quick Communication - Rx Refill/Question >> Jul 26, 2017  3:28 PM Yvette Rack wrote: Medication: diazepam (VALIUM) 10 MG tablet   Has the patient contacted their pharmacy? Yes.     (Agent: If no, request that the patient contact the pharmacy for the refill.)   Preferred Pharmacy (with phone number or street name): [id number] [address], [place] [phone] (Phone) [phone] (Fax)     Agent: Please be advised that RX refills may take up to 3 business days. We ask that you follow-up with your pharmacy.

## 2017-07-26 NOTE — Telephone Encounter (Signed)
Valium  refill Last OV: 05/15/17 Last Refill:07/04/17 #30 tabs  [facility]

## 2017-07-26 NOTE — Telephone Encounter (Signed)
Requesting refill of Valium  LOV 05/15/17 with Doroteo Bradford  [facility] 07/04/17  #30  No refills  [id number] [place], [place] [address]

## 2017-07-28 MED ORDER — DIAZEPAM 10 MG PO TABS: 10.0000 mg | ORAL_TABLET | Freq: Four times a day (QID) | ORAL | 0 refills | Status: DC | PRN

## 2017-07-28 NOTE — Telephone Encounter (Signed)
Rx sent electronically.  Meds ordered this encounter  Medications  . diazepam (VALIUM) 10 MG tablet    Sig: Take 1 tablet (10 mg total) by mouth every 6 (six) hours as needed.    Dispense:  30 tablet    Refill:  0

## 2017-08-13 ENCOUNTER — Other Ambulatory Visit: Payer: Self-pay | Admitting: Cardiology

## 2017-08-14 ENCOUNTER — Other Ambulatory Visit: Payer: Self-pay

## 2017-08-14 ENCOUNTER — Ambulatory Visit (INDEPENDENT_AMBULATORY_CARE_PROVIDER_SITE_OTHER): Payer: 59 | Admitting: Physician Assistant

## 2017-08-14 ENCOUNTER — Encounter: Payer: Self-pay | Admitting: Physician Assistant

## 2017-08-14 VITALS — BP 110/74 | HR 63 | Temp 97.8°F | Resp 18 | Ht 69.5 in | Wt 246.8 lb

## 2017-08-14 DIAGNOSIS — Z23 Encounter for immunization: Secondary | ICD-10-CM | POA: Diagnosis not present

## 2017-08-14 DIAGNOSIS — K3184 Gastroparesis: Secondary | ICD-10-CM

## 2017-08-14 DIAGNOSIS — I480 Paroxysmal atrial fibrillation: Secondary | ICD-10-CM

## 2017-08-14 DIAGNOSIS — E669 Obesity, unspecified: Secondary | ICD-10-CM

## 2017-08-14 DIAGNOSIS — E119 Type 2 diabetes mellitus without complications: Secondary | ICD-10-CM

## 2017-08-14 DIAGNOSIS — H9313 Tinnitus, bilateral: Secondary | ICD-10-CM

## 2017-08-14 DIAGNOSIS — R140 Abdominal distension (gaseous): Secondary | ICD-10-CM | POA: Diagnosis not present

## 2017-08-14 DIAGNOSIS — H04129 Dry eye syndrome of unspecified lacrimal gland: Secondary | ICD-10-CM

## 2017-08-14 DIAGNOSIS — E782 Mixed hyperlipidemia: Secondary | ICD-10-CM

## 2017-08-14 HISTORY — DX: Gastroparesis: K31.84

## 2017-08-14 HISTORY — DX: Dry eye syndrome of unspecified lacrimal gland: H04.129

## 2017-08-14 LAB — HEMOGLOBIN A1C
Est. average glucose Bld gHb Est-mCnc: 143 mg/dL
HEMOGLOBIN A1C: 6.6 % — AB (ref 4.8–5.6)

## 2017-08-14 LAB — LIPID PANEL
CHOL/HDL RATIO: 3.3 ratio (ref 0.0–5.0)
Cholesterol, Total: 136 mg/dL (ref 100–199)
HDL: 41 mg/dL (ref >39)
LDL CALC: 81 mg/dL (ref 0–99)
Triglycerides: 68 mg/dL (ref 0–149)
VLDL Cholesterol Cal: 14 mg/dL (ref 5–40)

## 2017-08-14 LAB — COMPREHENSIVE METABOLIC PANEL
ALBUMIN: 4.7 g/dL (ref 3.6–4.8)
ALK PHOS: 90 IU/L (ref 39–117)
ALT: 22 IU/L (ref 0–44)
AST: 22 IU/L (ref 0–40)
Albumin/Globulin Ratio: 1.7 (ref 1.2–2.2)
BUN / CREAT RATIO: 11 (ref 10–24)
BUN: 11 mg/dL (ref 8–27)
Bilirubin Total: 0.4 mg/dL (ref 0.0–1.2)
CALCIUM: 9.5 mg/dL (ref 8.6–10.2)
CO2: 19 mmol/L — ABNORMAL LOW (ref 20–29)
Chloride: 104 mmol/L (ref 96–106)
Creatinine, Ser: 1.02 mg/dL (ref 0.76–1.27)
GFR calc non Af Amer: 78 mL/min/{1.73_m2} (ref >59)
GFR, EST AFRICAN AMERICAN: 90 mL/min/{1.73_m2} (ref >59)
GLUCOSE: 110 mg/dL — AB (ref 65–99)
Globulin, Total: 2.7 g/dL (ref 1.5–4.5)
Potassium: 4.7 mmol/L (ref 3.5–5.2)
Sodium: 140 mmol/L (ref 134–144)
TOTAL PROTEIN: 7.4 g/dL (ref 6.0–8.5)

## 2017-08-14 MED ORDER — DIAZEPAM 10 MG PO TABS: 10.0000 mg | ORAL_TABLET | Freq: Every evening | ORAL | 0 refills | Status: DC | PRN

## 2017-08-14 NOTE — Patient Instructions (Addendum)
Keeping a food diary is an excellent way to get an idea of what we're eating and aiding in noticing what foods we can limit on. Exercise options like water aerobics can be a great way to increase activity to 150 minutes/week without putting strain on your joints.  Try using natural tears twice each day (or more). I like Systane brand drops.  Try using OTC simethicone (Mylanta Gas or Beano) to reduce the abdominal bloating and fullness. Try reducing the carbonated beverages. If your diabetes is not controlled, that could also be causing slow transit in your GI tract and cause these symptoms.    IF you received an x-ray today, you will receive an invoice from [facility]. Please contact [facility] at [phone] with questions or concerns regarding your invoice.   IF you received labwork today, you will receive an invoice from [place]. Please contact [facility] at [phone] with questions or concerns regarding your invoice.   Our billing staff will not be able to assist you with questions regarding bills from these companies.  You will be contacted with the lab results as soon as they are available. The fastest way to get your results is to activate your My Chart account. Instructions are located on the last page of this paperwork. If you have not heard from [place] regarding the results in 2 weeks, please contact this office.

## 2017-08-14 NOTE — Progress Notes (Signed)
Patient ID: Craig Roberson, male    DOB: [birth year], 62 y.o.   MRN: [id number]  PCP: [name], PA-C  Chief Complaint  Patient presents with  . Diabetes    Pt states glucose has come down a lot since medication increase. Pt states sugar are around 94-98 before eating and around 115 after eating.  Marland Kitchen Hyperlipidemia  . Follow-up  . Medication Refill    Diazepam 10 MG    Subjective:   Presents for evaluation of diabetes, hyperlipidemia, tinnitus.  Hasn't been able to get the [place] meter, due to cost. He notes improved glucose readings since last visit, but then admits that he isn't checking, as he doesn't have the strips for the meter he was using before he requested the [place]. Increased bloating, fullness. Eyes feel like sand in them, 6-12 months. Sees ophthalmology regularly, for macular degeneration, but hasn't mentioned this. Quit tobacco and has been gaining a lot of weight. Seeing urology for erectile dysfunction. Sildenafil isn't adequate. Occasionally itchy mole on his back.   Review of Systems Constitutional: Positive for unexpected weight change. Negative for activity change, appetite change, chills, diaphoresis, fatigue and fever.  HENT: Positive for tinnitus. Negative for congestion, dental problem, drooling, ear discharge, ear pain, facial swelling, hearing loss, mouth sores, nosebleeds, postnasal drip, rhinorrhea, sinus pressure, sinus pain, sneezing, sore throat, trouble swallowing and voice change.   Eyes: Negative.        Sensation of sand in eye.  Respiratory: Negative.   Cardiovascular: Negative.        No palpitations or feelings of flutter today.  Gastrointestinal: Positive for abdominal distention (Feeling full and bloated.). Negative for abdominal pain, anal bleeding, blood in stool, constipation, diarrhea and nausea.  Genitourinary: Negative.   Musculoskeletal: Negative.   Skin: Negative.        Occasional itching in mid back.    Neurological: Negative.    Depression screen [facility] 2/9 08/14/2017 02/16/2017 10/24/2016 09/27/2016 07/25/2016  Decreased Interest 0 0 0 0 0  Down, Depressed, Hopeless 0 0 0 0 0  PHQ - 2 Score 0 0 0 0 0  Altered sleeping - - - - -  Tired, decreased energy - - - - -  Change in appetite - - - - -  Feeling bad or failure about yourself  - - - - -  Trouble concentrating - - - - -  Moving slowly or fidgety/restless - - - - -  Suicidal thoughts - - - - -  PHQ-9 Score - - - - -    Patient Active Problem List   Diagnosis Date Noted  . AF (atrial fibrillation) ([place]) 09/28/2016  . OSA (obstructive sleep apnea) 07/27/2015  . Snoring 05/19/2015  . Excessive daytime sleepiness 05/19/2015  . PAF (paroxysmal atrial fibrillation) ([place]) 04/15/2015  . Family history of premature CAD 04/06/2015  . Heart palpitations 04/06/2015  . Diabetes mellitus type 2, uncomplicated ([place]) 79/48/0165  . Degenerative disc disease, lumbar 09/15/2014  . Obesity (BMI 30-39.9) 09/02/2013  . DJD (degenerative joint disease), multiple sites 02/18/2013  . Diverticulosis of sigmoid colon 08/20/2012  . AR (allergic rhinitis) 03/19/2012  . Mixed hyperlipidemia 03/19/2012  . Cardiac murmur   . ED (erectile dysfunction)   . Tinnitus of both ears   . Macular degeneration   . Hernia      Prior to Admission medications   Medication Sig Start Date End Date Taking? Authorizing Provider  atorvastatin (LIPITOR) 80 MG tablet TAKE 1 TABLET (  80 MG TOTAL) BY MOUTH DAILY. 07/02/17  Yes Oak Dorey, PA-C  azelastine (ASTELIN) 0.1 % nasal spray Place 1 spray into both nostrils 2 (two) times daily as needed for rhinitis. Use in each nostril as directed   Yes [provider]  cholecalciferol (VITAMIN D) 1000 UNITS tablet Take 1,000 Units by mouth daily.   Yes [provider]  Continuous Blood Gluc Sensor ([place]) MISC See admin instructions. 05/04/17  Yes [provider]  diazepam (VALIUM)  10 MG tablet Take 1 tablet (10 mg total) by mouth every 6 (six) hours as needed. 07/28/17  Yes Shaddai Shapley, PA-C  ezetimibe (ZETIA) 10 MG tablet TAKE 1 TABLET (10 MG TOTAL) BY MOUTH DAILY. 07/02/17  Yes Garritt Molyneux, PA-C  flecainide (TAMBOCOR) 50 MG tablet TAKE 1 TABLET (50 MG TOTAL) BY MOUTH 2 (TWO) TIMES DAILY. 05/29/17  Yes Simmons, Brittainy M, PA-C  fluticasone (FLONASE) 50 MCG/ACT nasal spray PLACE 2 SPRAYS INTO BOTH NOSTRILS DAILY. 07/03/17  Yes Clotee Schlicker, PA-C  glucose monitoring kit (FREESTYLE) monitoring kit 1 each by Does not apply route as needed for other. 01/16/17  Yes Tiras Bianchini, PA-C  metFORMIN (GLUCOPHAGE) 500 MG tablet Take 2 tablets (1,000 mg total) by mouth 2 (two) times daily with a meal. 06/14/17  Yes Yanelly Cantrelle, PA-C  metoprolol tartrate (LOPRESSOR) 25 MG tablet TAKE 1 TABLET BY MOUTH TWICE A DAY 08/13/17  Yes Camnitz, Will Hassell Done, MD  Omega-3 Fatty Acids (FISH OIL) 1200 MG CAPS Take 1,200 mg by mouth daily. Reported on 10/08/2015   Yes [provider]  Saw Palmetto 450 MG CAPS Take 1 capsule by mouth daily.   Yes [provider]  [facility] injection TO BE ADMINISTERED BY PHARMACIST FOR IMMUNIZATION 07/25/17  Yes [provider]  sildenafil (REVATIO) 20 MG tablet TAKE 2-5 TABLETS BY MOUTH 1 HOUR BEFORE INTERCOURSE 04/30/17  Yes [provider]     Allergies  Allergen Reactions  . Codeine Other (See Comments)    Dizziness; faint  . Hydrocodone-Acetaminophen     Unknown per pt   . Methocarbamol Other (See Comments)    HA, tinnitus  . Morphine And Related Other (See Comments)    Severe Headache  . Nsaids     BRBPR       Objective:  Physical Exam  Constitutional: He is oriented to person, place, and time. He appears well-developed and well-nourished. He is active and cooperative. No distress.  BP 110/74 (BP Location: Left Arm, Patient Position: Sitting, Cuff Size: Large)   Pulse 63   Temp 97.8 F (36.6 C) (Oral)    Resp 18   Ht 5' 9.5" (1.765 m)   Wt 246 lb 12.8 oz (111.9 kg)   SpO2 95%   BMI 35.92 kg/m   HENT:  Head: Normocephalic and atraumatic.  Right Ear: Hearing normal.  Left Ear: Hearing normal.  Eyes: Conjunctivae are normal. No scleral icterus.  Neck: Normal range of motion. Neck supple. No thyromegaly present.  Cardiovascular: Normal rate, regular rhythm and normal heart sounds.  Pulses:      Radial pulses are 2+ on the right side, and 2+ on the left side.  Pulmonary/Chest: Effort normal and breath sounds normal.  Lymphadenopathy:       Head (right side): No tonsillar, no preauricular, no posterior auricular and no occipital adenopathy present.       Head (left side): No tonsillar, no preauricular, no posterior auricular and no occipital adenopathy present.  He has no cervical adenopathy.       Right: No supraclavicular adenopathy present.       Left: No supraclavicular adenopathy present.  Neurological: He is alert and oriented to person, place, and time. No sensory deficit.  Skin: Skin is warm, dry and intact. Lesion (hyperkeratotic, hyperpigmented, stuck-on appearing plaque mid back, consistent with SK) noted. No rash noted. No cyanosis or erythema. Nails show no clubbing.  Psychiatric: He has a normal mood and affect. His speech is normal and behavior is normal.           Assessment & Plan:   Problem List Items Addressed This Visit    Mixed hyperlipidemia (Chronic)    Await labs. Adjust regimen as indicated by results. Goal LDL <70. Already on atorvastatin 80, Zetia 10 and OTC fish oil. Consider change from OTC fish oil to [place].      Relevant Orders   Comprehensive metabolic panel (Completed)   Lipid panel (Completed)   Tinnitus of both ears    Unchanged.      Relevant Medications   diazepam (VALIUM) 10 MG tablet   Obesity (BMI 30-39.9)    Increase exercise to 150 minutes/week. Low/no impact activity recommended. Suggested aquatic exercise. Reduce calories.  Discussed reading food nutrition labels, healthy vs healthier choices.      Diabetes mellitus type 2, uncomplicated ([place]) - Primary    Unclear control. He hasn't been checking home glucose recently. Advised him to either get the supplies for the previous meter, or decide to get the new one, either way, resume checking home glucose daily.  Await labs. Adjust regimen as indicated by results. Goal A1C <7%.      Relevant Orders   Comprehensive metabolic panel (Completed)   Hemoglobin A1c (Completed)   Microalbumin / creatinine urine ratio (Completed)   PAF (paroxysmal atrial fibrillation) (HCC)    Normal rhythm on exam today. Continue follow-up per cardiology.       Other Visit Diagnoses    Abdominal bloating       Trial of simethicone OTC.   Need for shingles vaccine       can obtain at his pharmacy, though national shortage may delay.   Relevant Medications   SHINGRIX injection       Return in about 3 months (around 11/11/2017) for re-evalaution of diabetes, cholesterol, blood pressure.   Fara Chute, PA-C [facility] at [place]

## 2017-08-14 NOTE — Progress Notes (Signed)
Subjective:    Patient ID: Craig Roberson, male    DOB: [birth year], 62 y.o.   MRN: [id number]  HPI  Chief Complaint  Patient presents with  . Diabetes    Pt states glucose has come down a lot since medication increase. Pt states sugar are around 94-98 before eating and around 115 after eating.  Marland Kitchen Hyperlipidemia  . Follow-up  . Medication Refill    Diazepam 10 MG   Additionally, patient has complaints of being "bloated" for long periods of time after eating (he first noticed this around 3 months ago,) having a hard time losing weight after quitting chewing tabacco two years ago, and a feeling of "sand in his eye" that has persisted for the past 6 months - 1 year.  Patient Active Problem List   Diagnosis Date Noted  . AF (atrial fibrillation) ([place]) 09/28/2016  . OSA (obstructive sleep apnea) 07/27/2015  . Snoring 05/19/2015  . Excessive daytime sleepiness 05/19/2015  . PAF (paroxysmal atrial fibrillation) ([place]) 04/15/2015  . Family history of premature CAD 04/06/2015  . Heart palpitations 04/06/2015  . Diabetes mellitus type 2, uncomplicated ([place]) 65/99/3570  . Degenerative disc disease, lumbar 09/15/2014  . Obesity (BMI 30-39.9) 09/02/2013  . DJD (degenerative joint disease), multiple sites 02/18/2013  . Diverticulosis of sigmoid colon 08/20/2012  . AR (allergic rhinitis) 03/19/2012  . Mixed hyperlipidemia 03/19/2012  . Cardiac murmur   . ED (erectile dysfunction)   . Tinnitus of both ears   . Macular degeneration   . Hernia    Allergies  Allergen Reactions  . Codeine Other (See Comments)    Dizziness; faint  . Hydrocodone-Acetaminophen     Unknown per pt   . Methocarbamol Other (See Comments)    HA, tinnitus  . Morphine And Related Other (See Comments)    Severe Headache  . Nsaids     BRBPR   Prior to Admission medications   Medication Sig Start Date End Date Taking? Authorizing Provider  atorvastatin (LIPITOR) 80 MG tablet TAKE 1 TABLET (80 MG TOTAL) BY  MOUTH DAILY. 07/02/17  Yes Jeffery, Chelle, PA-C  azelastine (ASTELIN) 0.1 % nasal spray Place 1 spray into both nostrils 2 (two) times daily as needed for rhinitis. Use in each nostril as directed   Yes [provider]  cholecalciferol (VITAMIN D) 1000 UNITS tablet Take 1,000 Units by mouth daily.   Yes [provider]  diazepam (VALIUM) 10 MG tablet Take 1 tablet (10 mg total) by mouth every 6 (six) hours as needed. 07/28/17  Yes Jeffery, Chelle, PA-C  ezetimibe (ZETIA) 10 MG tablet TAKE 1 TABLET (10 MG TOTAL) BY MOUTH DAILY. 07/02/17  Yes Jeffery, Chelle, PA-C  flecainide (TAMBOCOR) 50 MG tablet TAKE 1 TABLET (50 MG TOTAL) BY MOUTH 2 (TWO) TIMES DAILY. 05/29/17  Yes Simmons, Brittainy M, PA-C  fluticasone (FLONASE) 50 MCG/ACT nasal spray PLACE 2 SPRAYS INTO BOTH NOSTRILS DAILY. 07/03/17  Yes Jeffery, Chelle, PA-C  glucose monitoring kit (FREESTYLE) monitoring kit 1 each by Does not apply route as needed for other. 01/16/17  Yes Jeffery, Chelle, PA-C  metFORMIN (GLUCOPHAGE) 500 MG tablet Take 2 tablets (1,000 mg total) by mouth 2 (two) times daily with a meal. 06/14/17  Yes Jeffery, Chelle, PA-C  metoprolol tartrate (LOPRESSOR) 25 MG tablet TAKE 1 TABLET BY MOUTH TWICE A DAY 08/13/17  Yes Camnitz, Will Hassell Done, MD  Omega-3 Fatty Acids (FISH OIL) 1200 MG CAPS Take 1,200 mg by mouth daily. Reported on 10/08/2015  Yes [provider]  Saw Palmetto 450 MG CAPS Take 1 capsule by mouth daily.   Yes [provider]  sildenafil (REVATIO) 20 MG tablet TAKE 2-5 TABLETS BY MOUTH 1 HOUR BEFORE INTERCOURSE 04/30/17  Yes [provider]  Continuous Blood Gluc Sensor ([place]) MISC See admin instructions. 05/04/17  No [provider]  [facility] injection TO BE ADMINISTERED BY PHARMACIST FOR IMMUNIZATION 07/25/17  No [provider]   Past Medical History:  Diagnosis Date  . Allergy   . Arthritis    bilateral thumbs  . BCC (basal cell  carcinoma of skin) 04/2006   LEFT low back  . Cardiac murmur 1996   normal ECHO  . Diabetes mellitus without complication ([place])   . Diverticulosis   . Dry eye 08/14/2017  . ED (erectile dysfunction) 2006  . Gastroparesis 08/14/2017  . H/O ETOH abuse    Quit 2006  . Hernia    RIGHT inguinal  . Hyperlipidemia   . Macular degeneration   . PAF (paroxysmal atrial fibrillation) ([place]) 04/15/2015   During exercise myoview  . Tinnitus of both ears   . Ulcer    Social History   Socioeconomic History  . Marital status: Married    Spouse name: [name]  . Number of children: 2  . Years of education: 8  . Highest education level: Not on file  Social Needs  . Financial resource strain: Not on file  . Food insecurity - worry: Not on file  . Food insecurity - inability: Not on file  . Transportation needs - medical: Not on file  . Transportation needs - non-medical: Not on file  Occupational History  . Occupation: Retired    Fish farm manager: RETIRED    Comment: [employer]  Tobacco Use  . Smoking status: Former Research scientist (life sciences)  . Smokeless tobacco: Former Systems developer    Types: Chew  . Tobacco comment: some days  Substance and Sexual Activity  . Alcohol use: No    Alcohol/week: 0.0 oz  . Drug use: No  . Sexual activity: Not Currently    Partners: Female    Comment: family stress; his back pain have reduced the frequency  Other Topics Concern  . Not on file  Social History Narrative   Married to 3rd wife.   Family History  Problem Relation Age of Onset  . Heart disease Brother 72       AMI, normal weight, normal cholesterol  . Heart disease Father 44  . Cancer Father        Prostate  . Stroke Father   . Heart attack Father   . Stroke Son 34  . Hyperlipidemia Mother   . Atrial fibrillation Brother   . Vascular Disease Brother        carotid artery disease  . Hypertension Brother    Past Surgical History:  Procedure Laterality Date  . ATRIAL FIBRILLATION ABLATION N/A  09/28/2016   Procedure: Atrial Fibrillation Ablation;  Surgeon: Will Meredith Leeds, MD;  Location: [facility];  Service: Cardiovascular;  Laterality: N/A;  . GANGLION CYST EXCISION  1970's  . LUMBAR FUSION     L3-4; Dr. Ellene Route  . SPINE SURGERY    . THUMB FUSION     BILATERAL  . VASECTOMY  1976    Review of Systems  Constitutional: Positive for unexpected weight change. Negative for activity change, appetite change, chills, diaphoresis, fatigue and fever.  HENT: Positive for tinnitus. Negative for congestion, dental problem, drooling,  ear discharge, ear pain, facial swelling, hearing loss, mouth sores, nosebleeds, postnasal drip, rhinorrhea, sinus pressure, sinus pain, sneezing, sore throat, trouble swallowing and voice change.   Eyes: Negative.        Sensation of sand in eye.  Respiratory: Negative.   Cardiovascular: Negative.        No palpitations or feelings of flutter today.  Gastrointestinal: Positive for abdominal distention (Feeling full and bloated.). Negative for abdominal pain, anal bleeding, blood in stool, constipation, diarrhea and nausea.  Genitourinary: Negative.   Musculoskeletal: Negative.   Skin: Negative.        Occasional itching in mid back.  Neurological: Negative.        Objective:   Physical Exam  Constitutional: He is oriented to person, place, and time. He appears well-developed and well-nourished.  BP 110/74 (BP Location: Left Arm, Patient Position: Sitting, Cuff Size: Large)   Pulse 63   Temp 97.8 F (36.6 C) (Oral)   Resp 18   Ht 5' 9.5" (1.765 m)   Wt 246 lb 12.8 oz (111.9 kg)   SpO2 95%   BMI 35.92 kg/m   HENT:  Head: Normocephalic and atraumatic.  Right Ear: External ear normal.  Left Ear: External ear normal.  Nose: Nose normal.  Mouth/Throat: Oropharynx is clear and moist.  Eyes: Conjunctivae and EOM are normal. Pupils are equal, round, and reactive to light.  Neck: Normal range of motion. Neck supple.  Cardiovascular: Normal  rate, regular rhythm, normal heart sounds and intact distal pulses.  No murmur heard. Pulmonary/Chest: Effort normal and breath sounds normal.  Abdominal: Soft. Bowel sounds are normal. He exhibits distension. He exhibits no mass. There is no tenderness. There is no rebound and no guarding.  Musculoskeletal: Normal range of motion.  Neurological: He is alert and oriented to person, place, and time. He has normal reflexes.  Skin: Skin is warm and dry.    Wt Readings from Last 3 Encounters:  08/14/17 246 lb 12.8 oz (111.9 kg)  05/15/17 250 lb (113.4 kg)  04/04/17 251 lb 9.6 oz (114.1 kg)       Assessment & Plan:  1. Type 2 diabetes mellitus without complication, without long-term current use of insulin (HCC)  - Comprehensive metabolic panel - Hemoglobin A1c - Microalbumin / creatinine urine ratio -Patient counseled on keeping a food journal to get an idea about what he's eating and potential areas of change. Patient also counseled on the benefits of 150 min/week of aerobic exercise, specifically water aerobics, which would take pressure off his joints while enabling activity.   2. Mixed hyperlipidemia  - Comprehensive metabolic panel - Lipid panel  3. Obesity (BMI 30-39.9)  - Patient has expressed interest in losing weight. His weight has continued to climb since quitting chewing tabacco two years ago. Patient was counseled on documenting food intake and incorporating water aerobics into his routine as outlined above.  Return in about 3 months (around 11/11/2017) for re-evalaution of diabetes, cholesterol, blood pressure.

## 2017-08-15 LAB — MICROALBUMIN / CREATININE URINE RATIO
Creatinine, Urine: 81.9 mg/dL
Microalbumin, Urine: <3 ug/mL

## 2017-08-22 NOTE — Assessment & Plan Note (Signed)
Await labs. Adjust regimen as indicated by results. Goal LDL <70. Already on atorvastatin 80, Zetia 10 and OTC fish oil. Consider change from OTC fish oil to Lovaza.

## 2017-08-22 NOTE — Assessment & Plan Note (Signed)
Normal rhythm on exam today. Continue follow-up per cardiology.

## 2017-08-22 NOTE — Assessment & Plan Note (Signed)
Unclear control. He hasn't been checking home glucose recently. Advised him to either get the supplies for the previous meter, or decide to get the new one, either way, resume checking home glucose daily.  Await labs. Adjust regimen as indicated by results. Goal A1C <7%.

## 2017-08-22 NOTE — Assessment & Plan Note (Signed)
Unchanged

## 2017-08-22 NOTE — Assessment & Plan Note (Signed)
Increase exercise to 150 minutes/week. Low/no impact activity recommended. Suggested aquatic exercise. Reduce calories. Discussed reading food nutrition labels, healthy vs healthier choices.

## 2017-09-03 ENCOUNTER — Other Ambulatory Visit: Payer: Self-pay | Admitting: Physician Assistant

## 2017-09-03 DIAGNOSIS — H9313 Tinnitus, bilateral: Secondary | ICD-10-CM

## 2017-09-04 MED ORDER — DIAZEPAM 10 MG PO TABS: 10.0000 mg | ORAL_TABLET | Freq: Every evening | ORAL | 0 refills | Status: DC | PRN

## 2017-09-04 NOTE — Telephone Encounter (Signed)
Patient is requesting a refill of the following medications: Requested Prescriptions   Pending Prescriptions Disp Refills  . diazepam (VALIUM) 10 MG tablet 30 tablet 0    Sig: Take 1 tablet (10 mg total) by mouth at bedtime as needed (tinnitus).    Date of patient request: 09/02/17 Last office visit: 08/14/17 Date of last refill: 08/14/17 Last refill amount: #30 0RF Follow up time period per chart: 11/13/17

## 2017-09-16 DIAGNOSIS — H00015 Hordeolum externum left lower eyelid: Secondary | ICD-10-CM | POA: Diagnosis not present

## 2017-09-16 DIAGNOSIS — R590 Localized enlarged lymph nodes: Secondary | ICD-10-CM | POA: Diagnosis not present

## 2017-10-01 ENCOUNTER — Encounter: Payer: Self-pay | Admitting: Physician Assistant

## 2017-10-16 ENCOUNTER — Other Ambulatory Visit: Payer: Self-pay | Admitting: Physician Assistant

## 2017-10-16 DIAGNOSIS — H9313 Tinnitus, bilateral: Secondary | ICD-10-CM

## 2017-10-17 MED ORDER — DIAZEPAM 10 MG PO TABS: 10.0000 mg | ORAL_TABLET | Freq: Every evening | ORAL | 0 refills | Status: DC | PRN

## 2017-10-19 DIAGNOSIS — T1582XA Foreign body in other and multiple parts of external eye, left eye, initial encounter: Secondary | ICD-10-CM | POA: Diagnosis not present

## 2017-11-13 ENCOUNTER — Ambulatory Visit: Payer: 59 | Admitting: Physician Assistant

## 2017-11-14 ENCOUNTER — Other Ambulatory Visit: Payer: Self-pay | Admitting: Physician Assistant

## 2017-11-14 DIAGNOSIS — H9313 Tinnitus, bilateral: Secondary | ICD-10-CM

## 2017-11-14 MED ORDER — DIAZEPAM 10 MG PO TABS: 10.0000 mg | ORAL_TABLET | Freq: Every evening | ORAL | 0 refills | Status: DC | PRN

## 2017-11-14 NOTE — Telephone Encounter (Signed)
Rx sent electronically.  Meds ordered this encounter  Medications  . diazepam (VALIUM) 10 MG tablet    Sig: Take 1 tablet (10 mg total) by mouth at bedtime as needed (tinnitus).    Dispense:  30 tablet    Refill:  0   Patient no showed for his visit with me yesterday. Please advise him that I need to see him back before I can provide additional refills, and that he'll need to do that before I leave.  Otherwise, he'll need to see me at [facility] or establish with one of my colleagues here.

## 2017-11-17 ENCOUNTER — Other Ambulatory Visit: Payer: Self-pay | Admitting: Physician Assistant

## 2017-11-17 ENCOUNTER — Other Ambulatory Visit: Payer: Self-pay | Admitting: Cardiology

## 2017-11-17 DIAGNOSIS — R0981 Nasal congestion: Secondary | ICD-10-CM

## 2017-11-20 DIAGNOSIS — E782 Mixed hyperlipidemia: Secondary | ICD-10-CM | POA: Diagnosis not present

## 2017-11-20 DIAGNOSIS — I10 Essential (primary) hypertension: Secondary | ICD-10-CM | POA: Diagnosis not present

## 2017-11-20 DIAGNOSIS — I480 Paroxysmal atrial fibrillation: Secondary | ICD-10-CM | POA: Diagnosis not present

## 2017-11-20 DIAGNOSIS — E119 Type 2 diabetes mellitus without complications: Secondary | ICD-10-CM | POA: Diagnosis not present

## 2017-11-22 ENCOUNTER — Encounter: Payer: Self-pay | Admitting: Cardiology

## 2017-11-22 ENCOUNTER — Ambulatory Visit: Payer: 59 | Admitting: Cardiology

## 2017-11-22 VITALS — BP 110/74 | HR 55 | Ht 69.5 in | Wt 249.2 lb

## 2017-11-22 DIAGNOSIS — I480 Paroxysmal atrial fibrillation: Secondary | ICD-10-CM | POA: Diagnosis not present

## 2017-11-22 DIAGNOSIS — E785 Hyperlipidemia, unspecified: Secondary | ICD-10-CM

## 2017-11-22 DIAGNOSIS — G4733 Obstructive sleep apnea (adult) (pediatric): Secondary | ICD-10-CM | POA: Diagnosis not present

## 2017-11-22 NOTE — Patient Instructions (Signed)
Medication Instructions:  Your physician recommends that you continue on your current medications as directed. Please refer to the Current Medication list given to you today.  Labwork: None ordered     *We will only notify you of abnormal results, otherwise continue current treatment plan.  Testing/Procedures: None ordered  Follow-Up: Your physician wants you to follow-up in: 6 months  with Dr. Camnitz.  You will receive a reminder letter in the mail two months in advance. If you don't receive a letter, please call our office to schedule the follow-up appointment.   * If you need a refill on your cardiac medications before your next appointment, please call your pharmacy.   *Please note that any paperwork needing to be filled out by the provider will need to be addressed at the front desk prior to seeing the provider. Please note that any FMLA, disability or other documents regarding health condition is subject to a $25.00 charge that must be received prior to completion of paperwork in the form of a money order or check.  Thank you for choosing [facility]!!   Lacosta Hargan, RN [phone]      

## 2017-11-22 NOTE — Progress Notes (Signed)
Electrophysiology Office Note   Date:  11/22/2017   ID:  Craig Roberson, DOB 1956-01-05, MRN [id number]  PCP:  [name], PA-C  Cardiologist:  Craig Roberson Primary Electrophysiologist:  Craig Bettes Meredith Leeds, MD    Chief Complaint  Patient presents with  . Paroxysmal atrial fibrillation     History of Present Illness: Craig Roberson is a 62 y.o. male who presents today for electrophysiology evaluation.   He has a history of paroxsymal atrial fibrillation/flutter mild OSA, HLD and DM. An episode of A. fib with RVR in December after heavy caffeine use. He was started on Xarelto metoprolol at the time. When he was seen back in January, he was found to be in sinus rhythm with a rate of 63. He was continuing to have palpitations most days. He was started on flecainide 50 mg. he had an atrial fibrillation ablation on 09/28/16. He is continued on his flecainide since that time.   Today, denies symptoms of palpitations, chest pain, shortness of breath, orthopnea, PND, lower extremity edema, claudication, dizziness, presyncope, syncope, bleeding, or neurologic sequela. The patient is tolerating medications without difficulties. He continues to have occasional palpitations that have lasted up to 10 seconds. he feels that his AF is under good control.   Past Medical History:  Diagnosis Date  . Allergy   . Arthritis    bilateral thumbs  . BCC (basal cell carcinoma of skin) 04/2006   LEFT low back  . Cardiac murmur 1996   normal ECHO  . Diabetes mellitus without complication ([place])   . Diverticulosis   . Dry eye 08/14/2017  . ED (erectile dysfunction) 2006  . Gastroparesis 08/14/2017  . H/O ETOH abuse    Quit 2006  . Hernia    RIGHT inguinal  . Hyperlipidemia   . Macular degeneration   . PAF (paroxysmal atrial fibrillation) ([place]) 04/15/2015   During exercise myoview  . Tinnitus of both ears   . Ulcer    Past Surgical History:  Procedure Laterality Date  . ATRIAL  FIBRILLATION ABLATION N/A 09/28/2016   Procedure: Atrial Fibrillation Ablation;  Surgeon: Aamiyah Derrick Meredith Leeds, MD;  Location: [facility];  Service: Cardiovascular;  Laterality: N/A;  . GANGLION CYST EXCISION  1970's  . LUMBAR FUSION     L3-4; Dr. Ellene Route  . SPINE SURGERY    . THUMB FUSION     BILATERAL  . VASECTOMY  1976     Current Outpatient Medications  Medication Sig Dispense Refill  . atorvastatin (LIPITOR) 80 MG tablet TAKE 1 TABLET (80 MG TOTAL) BY MOUTH DAILY. 90 tablet 3  . azelastine (ASTELIN) 0.1 % nasal spray Place 1 spray into both nostrils 2 (two) times daily as needed for rhinitis. Use in each nostril as directed    . cholecalciferol (VITAMIN D) 1000 UNITS tablet Take 1,000 Units by mouth daily.    . Continuous Blood Gluc Sensor ([place]) MISC See admin instructions.  3  . diazepam (VALIUM) 10 MG tablet Take 1 tablet (10 mg total) by mouth at bedtime as needed (tinnitus). 30 tablet 0  . ezetimibe (ZETIA) 10 MG tablet TAKE 1 TABLET (10 MG TOTAL) BY MOUTH DAILY. 90 tablet 3  . flecainide (TAMBOCOR) 50 MG tablet TAKE 1 TABLET (50 MG TOTAL) BY MOUTH 2 (TWO) TIMES DAILY. 180 tablet 3  . fluticasone (FLONASE) 50 MCG/ACT nasal spray SPRAY 2 SPRAYS INTO EACH NOSTRIL EVERY DAY 16 g 2  . glucose monitoring kit (FREESTYLE) monitoring kit  1 each by Does not apply route as needed for other. 1 each 1  . metFORMIN (GLUCOPHAGE) 500 MG tablet Take 2 tablets (1,000 mg total) by mouth 2 (two) times daily with a meal. 360 tablet 3  . metoprolol tartrate (LOPRESSOR) 25 MG tablet Take 1 tablet (25 mg total) by mouth 2 (two) times daily. Please keep upcoming appt for future refills. Thank you 180 tablet 1  . Omega-3 Fatty Acids (FISH OIL) 1200 MG CAPS Take 1,200 mg by mouth daily. Reported on 10/08/2015    . Saw Palmetto 450 MG CAPS Take 1 capsule by mouth daily.    Marland Kitchen SHINGRIX injection TO BE ADMINISTERED BY PHARMACIST FOR IMMUNIZATION  0  . sildenafil (REVATIO) 20 MG  tablet TAKE 2-5 TABLETS BY MOUTH 1 HOUR BEFORE INTERCOURSE  11   No current facility-administered medications for this visit.     Allergies:   Codeine; Hydrocodone-acetaminophen; Methocarbamol; Morphine and related; and Nsaids   Social History:  The patient  reports that he has quit smoking. He has quit using smokeless tobacco. His smokeless tobacco use included chew. He reports that he does not drink alcohol or use drugs.   Family History:  The patient's family history includes Atrial fibrillation in his brother; Cancer in his father; Heart attack in his father; Heart disease (age of onset: 33) in his father; Heart disease (age of onset: 30) in his brother; Hyperlipidemia in his mother; Hypertension in his brother; Stroke in his father; Stroke (age of onset: 87) in his son; Vascular Disease in his brother.   ROS:  Please see the history of present illness.   Otherwise, review of systems is positive for none.   All other systems are reviewed and negative.   PHYSICAL EXAM: VS:  BP 110/74   Pulse (!) 55   Ht 5' 9.5" (1.765 m)   Wt 249 lb 3.2 oz (113 kg)   BMI 36.27 kg/m  , BMI Body mass index is 36.27 kg/m. GEN: Well nourished, well developed, in no acute distress  HEENT: normal  Neck: no JVD, carotid bruits, or masses Cardiac: RRR; no murmurs, rubs, or gallops,no edema  Respiratory:  clear to auscultation bilaterally, normal work of breathing GI: soft, nontender, nondistended, + BS MS: no deformity or atrophy  Skin: warm and dry Neuro:  Strength and sensation are intact Psych: euthymic mood, full affect  EKG:  EKG is ordered today. Personal review of the ekg ordered shows SR, rate 55   Recent Labs: 05/15/2017: Hemoglobin 14.6; Platelets 321 08/14/2017: ALT 22; BUN 11; Creatinine, Ser 1.02; Potassium 4.7; Sodium 140    Lipid Panel     Component Value Date/Time   CHOL 136 08/14/2017 0843   TRIG 68 08/14/2017 0843   HDL 41 08/14/2017 0843   CHOLHDL 3.3 08/14/2017 0843    CHOLHDL 4.1 02/22/2016 1237   VLDL 25 02/22/2016 1237   LDLCALC 81 08/14/2017 0843     Wt Readings from Last 3 Encounters:  11/22/17 249 lb 3.2 oz (113 kg)  08/14/17 246 lb 12.8 oz (111.9 kg)  05/15/17 250 lb (113.4 kg)      Other studies Reviewed: Additional studies/ records that were reviewed today include: TTE 04/22/15  Review of the above records today demonstrates:  - Left ventricle: The cavity size was normal. There was mild focal   basal hypertrophy of the septum. Systolic function was normal.   The estimated ejection fraction was in the range of 55% to 60%.   Wall motion  was normal; there were no regional wall motion   abnormalities. Left ventricular diastolic function parameters   were normal. - Right ventricle: The cavity size was normal. Wall thickness was   normal. Systolic function was normal. - Atrial septum: No defect or patent foramen ovale was identified. - Tricuspid valve: There was no regurgitation. - Pulmonic valve: There was trivial regurgitation. - Inferior vena cava: The vessel was normal in size. The   respirophasic diameter changes were in the normal range (>= 50%),   consistent with normal central venous pressure.   ASSESSMENT AND PLAN:  1.  Proximal atrial fibrillation/atrial flutter: Ablation 09/28/16. On flecainide and metoprolol. Minimal symptoms from AF. No changes.  This patients CHA2DS2-VASc Score and unadjusted Ischemic Stroke Rate (% per year) is equal to 0.6 % stroke rate/year from a score of 1  Above score calculated as 1 point each if present [CHF, HTN, DM, Vascular=MI/PAD/Aortic Plaque, Age if 65-74, or Male] Above score calculated as 2 points each if present [Age > 75, or Stroke/TIA/TE]  2. Hyperlipidemia: continue atorvastatin  3. OSA: stressed CPAP compliance   Current medicines are reviewed at length with the patient today.   The patient does not have concerns regarding his medicines.  The following changes were made today:   none  Labs/ tests ordered today include:  Orders Placed This Encounter  Procedures  . EKG 12-Lead     Disposition:   FU with Ernie Sagrero 6 months  Signed, Miyeko Mahlum Meredith Leeds, MD  11/22/2017 9:37 AM     [facility] [address] [place] [address] [phone] (office) [phone] (fax)

## 2018-01-31 DIAGNOSIS — L821 Other seborrheic keratosis: Secondary | ICD-10-CM | POA: Diagnosis not present

## 2018-01-31 DIAGNOSIS — C44612 Basal cell carcinoma of skin of right upper limb, including shoulder: Secondary | ICD-10-CM | POA: Diagnosis not present

## 2018-01-31 DIAGNOSIS — L570 Actinic keratosis: Secondary | ICD-10-CM | POA: Diagnosis not present

## 2018-01-31 DIAGNOSIS — C4441 Basal cell carcinoma of skin of scalp and neck: Secondary | ICD-10-CM | POA: Diagnosis not present

## 2018-01-31 DIAGNOSIS — C44519 Basal cell carcinoma of skin of other part of trunk: Secondary | ICD-10-CM | POA: Diagnosis not present

## 2018-01-31 DIAGNOSIS — L918 Other hypertrophic disorders of the skin: Secondary | ICD-10-CM | POA: Diagnosis not present

## 2018-01-31 DIAGNOSIS — D1801 Hemangioma of skin and subcutaneous tissue: Secondary | ICD-10-CM | POA: Diagnosis not present

## 2018-02-07 DIAGNOSIS — Z23 Encounter for immunization: Secondary | ICD-10-CM | POA: Diagnosis not present

## 2018-02-11 DIAGNOSIS — C4441 Basal cell carcinoma of skin of scalp and neck: Secondary | ICD-10-CM | POA: Diagnosis not present

## 2018-02-11 DIAGNOSIS — C44612 Basal cell carcinoma of skin of right upper limb, including shoulder: Secondary | ICD-10-CM | POA: Diagnosis not present

## 2018-02-11 DIAGNOSIS — C44519 Basal cell carcinoma of skin of other part of trunk: Secondary | ICD-10-CM | POA: Diagnosis not present

## 2018-02-20 DIAGNOSIS — H2513 Age-related nuclear cataract, bilateral: Secondary | ICD-10-CM | POA: Diagnosis not present

## 2018-02-20 DIAGNOSIS — H353110 Nonexudative age-related macular degeneration, right eye, stage unspecified: Secondary | ICD-10-CM | POA: Diagnosis not present

## 2018-04-30 DIAGNOSIS — I10 Essential (primary) hypertension: Secondary | ICD-10-CM | POA: Diagnosis not present

## 2018-04-30 DIAGNOSIS — H9313 Tinnitus, bilateral: Secondary | ICD-10-CM | POA: Diagnosis not present

## 2018-04-30 DIAGNOSIS — E119 Type 2 diabetes mellitus without complications: Secondary | ICD-10-CM | POA: Diagnosis not present

## 2018-05-12 ENCOUNTER — Other Ambulatory Visit: Payer: Self-pay | Admitting: Cardiology

## 2018-05-31 ENCOUNTER — Other Ambulatory Visit: Payer: Self-pay | Admitting: Cardiology

## 2018-05-31 DIAGNOSIS — I480 Paroxysmal atrial fibrillation: Secondary | ICD-10-CM

## 2018-08-22 DIAGNOSIS — D1801 Hemangioma of skin and subcutaneous tissue: Secondary | ICD-10-CM | POA: Diagnosis not present

## 2018-08-22 DIAGNOSIS — D0461 Carcinoma in situ of skin of right upper limb, including shoulder: Secondary | ICD-10-CM | POA: Diagnosis not present

## 2018-08-22 DIAGNOSIS — Z85828 Personal history of other malignant neoplasm of skin: Secondary | ICD-10-CM | POA: Diagnosis not present

## 2018-08-22 DIAGNOSIS — L821 Other seborrheic keratosis: Secondary | ICD-10-CM | POA: Diagnosis not present

## 2018-08-22 DIAGNOSIS — D2261 Melanocytic nevi of right upper limb, including shoulder: Secondary | ICD-10-CM | POA: Diagnosis not present

## 2018-08-22 DIAGNOSIS — L814 Other melanin hyperpigmentation: Secondary | ICD-10-CM | POA: Diagnosis not present

## 2018-08-22 DIAGNOSIS — L570 Actinic keratosis: Secondary | ICD-10-CM | POA: Diagnosis not present

## 2018-08-22 DIAGNOSIS — D2262 Melanocytic nevi of left upper limb, including shoulder: Secondary | ICD-10-CM | POA: Diagnosis not present

## 2018-08-22 DIAGNOSIS — D225 Melanocytic nevi of trunk: Secondary | ICD-10-CM | POA: Diagnosis not present

## 2018-08-22 DIAGNOSIS — D2371 Other benign neoplasm of skin of right lower limb, including hip: Secondary | ICD-10-CM | POA: Diagnosis not present

## 2018-08-26 DIAGNOSIS — M545 Low back pain: Secondary | ICD-10-CM | POA: Diagnosis not present

## 2018-09-12 ENCOUNTER — Encounter: Payer: Self-pay | Admitting: *Deleted

## 2018-09-20 ENCOUNTER — Ambulatory Visit: Payer: 59 | Admitting: Cardiology

## 2018-11-13 ENCOUNTER — Other Ambulatory Visit: Payer: Self-pay | Admitting: Cardiology

## 2018-11-13 MED ORDER — METOPROLOL TARTRATE 25 MG PO TABS: ORAL_TABLET | ORAL | 0 refills | Status: DC

## 2018-12-16 ENCOUNTER — Other Ambulatory Visit: Payer: Self-pay | Admitting: Cardiology

## 2018-12-16 DIAGNOSIS — I480 Paroxysmal atrial fibrillation: Secondary | ICD-10-CM

## 2018-12-19 ENCOUNTER — Other Ambulatory Visit: Payer: Self-pay | Admitting: Cardiology

## 2018-12-19 DIAGNOSIS — I480 Paroxysmal atrial fibrillation: Secondary | ICD-10-CM

## 2019-01-17 ENCOUNTER — Other Ambulatory Visit: Payer: Self-pay | Admitting: Cardiology

## 2019-01-17 DIAGNOSIS — I480 Paroxysmal atrial fibrillation: Secondary | ICD-10-CM

## 2019-01-20 ENCOUNTER — Telehealth: Payer: Self-pay | Admitting: Cardiology

## 2019-01-20 NOTE — Telephone Encounter (Signed)

## 2019-01-21 ENCOUNTER — Other Ambulatory Visit: Payer: Self-pay

## 2019-01-21 ENCOUNTER — Ambulatory Visit (INDEPENDENT_AMBULATORY_CARE_PROVIDER_SITE_OTHER): Payer: 59 | Admitting: Cardiology

## 2019-01-21 ENCOUNTER — Encounter: Payer: Self-pay | Admitting: Cardiology

## 2019-01-21 VITALS — BP 124/82 | HR 59 | Ht 72.0 in | Wt 245.0 lb

## 2019-01-21 DIAGNOSIS — I480 Paroxysmal atrial fibrillation: Secondary | ICD-10-CM

## 2019-01-21 MED ORDER — METOPROLOL TARTRATE 25 MG PO TABS: ORAL_TABLET | ORAL | 3 refills | Status: DC

## 2019-01-21 MED ORDER — FLECAINIDE ACETATE 50 MG PO TABS: 50.0000 mg | ORAL_TABLET | Freq: Two times a day (BID) | ORAL | 11 refills | Status: DC

## 2019-01-21 NOTE — Progress Notes (Signed)
Electrophysiology Office Note   Date:  01/21/2019   ID:  Craig Roberson, DOB 1956/04/19, MRN [id number]  PCP:  [facility] @ [place]  Cardiologist:  [name] Primary Electrophysiologist:  Burk Hoctor Meredith Leeds, MD    No chief complaint on file.    History of Present Illness: Craig Roberson is a 63 y.o. male who presents today for electrophysiology evaluation.   He has a history of paroxsymal atrial fibrillation/flutter mild OSA, HLD and DM. An episode of A. fib with RVR in December after heavy caffeine use. He was started on Xarelto metoprolol at the time. When he was seen back in January, he was found to be in sinus rhythm with a rate of 63. He was continuing to have palpitations most days. He was started on flecainide 50 mg. he had an atrial fibrillation ablation on 09/28/16. He is continued on his flecainide since that time.   Today, denies symptoms of palpitations, chest pain, shortness of breath, orthopnea, PND, lower extremity edema, claudication, dizziness, presyncope, syncope, bleeding, or neurologic sequela. The patient is tolerating medications without difficulties.  Overall he is doing well.  He notes up to 5 seconds of palpitations, but they are all short-lived and rare.  Otherwise he is able to do all his daily activities without restriction.  Past Medical History:  Diagnosis Date  . Allergy   . Arthritis    bilateral thumbs  . BCC (basal cell carcinoma of skin) 04/2006   LEFT low back  . Cardiac murmur 1996   normal ECHO  . Diabetes mellitus without complication ([place])   . Diverticulosis   . Dry eye 08/14/2017  . ED (erectile dysfunction) 2006  . Gastroparesis 08/14/2017  . H/O ETOH abuse    Quit 2006  . Hernia    RIGHT inguinal  . Hyperlipidemia   . Macular degeneration   . PAF (paroxysmal atrial fibrillation) ([place]) 04/15/2015   During exercise myoview  . Tinnitus of both ears   . Ulcer    Past Surgical History:  Procedure  Laterality Date  . ATRIAL FIBRILLATION ABLATION N/A 09/28/2016   Procedure: Atrial Fibrillation Ablation;  Surgeon: Nikkia Devoss Meredith Leeds, MD;  Location: [facility];  Service: Cardiovascular;  Laterality: N/A;  . GANGLION CYST EXCISION  1970's  . LUMBAR FUSION     L3-4; Dr. Ellene Route  . SPINE SURGERY    . THUMB FUSION     BILATERAL  . VASECTOMY  1976     Current Outpatient Medications  Medication Sig Dispense Refill  . atorvastatin (LIPITOR) 80 MG tablet TAKE 1 TABLET (80 MG TOTAL) BY MOUTH DAILY. 90 tablet 3  . azelastine (ASTELIN) 0.1 % nasal spray Place 1 spray into both nostrils 2 (two) times daily as needed for rhinitis. Use in each nostril as directed    . cholecalciferol (VITAMIN D) 1000 UNITS tablet Take 1,000 Units by mouth daily.    . Continuous Blood Gluc Sensor ([place]) MISC See admin instructions.  3  . diazepam (VALIUM) 10 MG tablet Take 1 tablet (10 mg total) by mouth at bedtime as needed (tinnitus). 30 tablet 0  . ezetimibe (ZETIA) 10 MG tablet TAKE 1 TABLET (10 MG TOTAL) BY MOUTH DAILY. 90 tablet 3  . flecainide (TAMBOCOR) 50 MG tablet Take 1 tablet (50 mg total) by mouth 2 (two) times daily. Patient must keep 01/21/19 appointment for further refills 60 tablet 0  . glucose monitoring kit (FREESTYLE) monitoring kit 1 each by  Does not apply route as needed for other. 1 each 1  . metFORMIN (GLUCOPHAGE) 500 MG tablet Take 2 tablets (1,000 mg total) by mouth 2 (two) times daily with a meal. 360 tablet 3  . metoprolol tartrate (LOPRESSOR) 25 MG tablet TAKE 1 TABLET (25 MG TOTAL) BY MOUTH 2 (TWO) TIMES DAILY. Please make overdue appt with Dr Curt Bears before anymore refills. 1st attempt 60 tablet 0  . Omega-3 Fatty Acids (FISH OIL) 1200 MG CAPS Take 1,200 mg by mouth daily. Reported on 10/08/2015    . Saw Palmetto 450 MG CAPS Take 1 capsule by mouth daily.    Marland Kitchen SHINGRIX injection TO BE ADMINISTERED BY PHARMACIST FOR IMMUNIZATION  0  . sildenafil (REVATIO) 20  MG tablet TAKE 2-5 TABLETS BY MOUTH 1 HOUR BEFORE INTERCOURSE  11   No current facility-administered medications for this visit.     Allergies:   Codeine, Hydrocodone-acetaminophen, Methocarbamol, Morphine and related, and Nsaids   Social History:  The patient  reports that he has quit smoking. He has quit using smokeless tobacco.  His smokeless tobacco use included chew. He reports that he does not drink alcohol or use drugs.   Family History:  The patient's family history includes Atrial fibrillation in his brother; Cancer in his father; Heart attack in his father; Heart disease (age of onset: 31) in his father; Heart disease (age of onset: 65) in his brother; Hyperlipidemia in his mother; Hypertension in his brother; Stroke in his father; Stroke (age of onset: 67) in his son; Vascular Disease in his brother.   ROS:  Please see the history of present illness.   Otherwise, review of systems is positive for none.   All other systems are reviewed and negative.   PHYSICAL EXAM: VS:  BP 124/82   Pulse (!) 59   Ht 6' (1.829 m)   Wt 245 lb (111.1 kg)   SpO2 94%   BMI 33.23 kg/m  , BMI Body mass index is 33.23 kg/m. GEN: Well nourished, well developed, in no acute distress  HEENT: normal  Neck: no JVD, carotid bruits, or masses Cardiac: RRR; no murmurs, rubs, or gallops,no edema  Respiratory:  clear to auscultation bilaterally, normal work of breathing GI: soft, nontender, nondistended, + BS MS: no deformity or atrophy  Skin: warm and dry Neuro:  Strength and sensation are intact Psych: euthymic mood, full affect  EKG:  EKG is ordered today. Personal review of the ekg ordered shows SR, rate 59    Recent Labs: No results found for requested labs within last 8760 hours.    Lipid Panel     Component Value Date/Time   CHOL 136 08/14/2017 0843   TRIG 68 08/14/2017 0843   HDL 41 08/14/2017 0843   CHOLHDL 3.3 08/14/2017 0843   CHOLHDL 4.1 02/22/2016 1237   VLDL 25 02/22/2016 1237    LDLCALC 81 08/14/2017 0843     Wt Readings from Last 3 Encounters:  01/21/19 245 lb (111.1 kg)  11/22/17 249 lb 3.2 oz (113 kg)  08/14/17 246 lb 12.8 oz (111.9 kg)      Other studies Reviewed: Additional studies/ records that were reviewed today include: TTE 04/22/15  Review of the above records today demonstrates:  - Left ventricle: The cavity size was normal. There was mild focal   basal hypertrophy of the septum. Systolic function was normal.   The estimated ejection fraction was in the range of 55% to 60%.   Wall motion was normal; there were  no regional wall motion   abnormalities. Left ventricular diastolic function parameters   were normal. - Right ventricle: The cavity size was normal. Wall thickness was   normal. Systolic function was normal. - Atrial septum: No defect or patent foramen ovale was identified. - Tricuspid valve: There was no regurgitation. - Pulmonic valve: There was trivial regurgitation. - Inferior vena cava: The vessel was normal in size. The   respirophasic diameter changes were in the normal range (>= 50%),   consistent with normal central venous pressure.   ASSESSMENT AND PLAN:  1.  Proximal atrial fibrillation/atrial flutter: Status post ablation 09/28/2016.  Currently on flecainide and metoprolol.  Remains in sinus rhythm.  No changes.  This patients CHA2DS2-VASc Score and unadjusted Ischemic Stroke Rate (% per year) is equal to 0.6 % stroke rate/year from a score of 1  Above score calculated as 1 point each if present [CHF, HTN, DM, Vascular=MI/PAD/Aortic Plaque, Age if 65-74, or Male] Above score calculated as 2 points each if present [Age > 75, or Stroke/TIA/TE]   2. Hyperlipidemia: Continue atorvastatin  3. OSA: Stressed CPAP compliance   Current medicines are reviewed at length with the patient today.   The patient does not have concerns regarding his medicines.  The following changes were made today: None  Labs/ tests ordered  today include:  Orders Placed This Encounter  Procedures  . EKG 12-Lead     Disposition:   FU with Zaden Sako 12 months  Signed, Alphonsa Brickle Meredith Leeds, MD  01/21/2019 8:27 AM     [facility] [address] [place] [address] [phone] (office) [phone] (fax)

## 2019-01-21 NOTE — Addendum Note (Signed)
Addended by: Stanton Kidney on: 01/21/2019 08:39 AM   Modules accepted: Orders

## 2019-01-21 NOTE — Patient Instructions (Signed)
Medication Instructions:  Your physician recommends that you continue on your current medications as directed. Please refer to the Current Medication list given to you today.  * If you need a refill on your cardiac medications before your next appointment, please call your pharmacy.   Labwork: None ordered  Testing/Procedures: None ordered  Follow-Up: Your physician wants you to follow-up in: 1 year with Dr. Camnitz.  You will receive a reminder letter in the mail two months in advance. If you don't receive a letter, please call our office to schedule the follow-up appointment.  Thank you for choosing [facility]!!   Kenyon Eichelberger, RN [phone]        

## 2019-02-04 DIAGNOSIS — L570 Actinic keratosis: Secondary | ICD-10-CM | POA: Diagnosis not present

## 2019-02-04 DIAGNOSIS — D225 Melanocytic nevi of trunk: Secondary | ICD-10-CM | POA: Diagnosis not present

## 2019-02-04 DIAGNOSIS — L820 Inflamed seborrheic keratosis: Secondary | ICD-10-CM | POA: Diagnosis not present

## 2019-02-04 DIAGNOSIS — D1801 Hemangioma of skin and subcutaneous tissue: Secondary | ICD-10-CM | POA: Diagnosis not present

## 2019-02-04 DIAGNOSIS — L821 Other seborrheic keratosis: Secondary | ICD-10-CM | POA: Diagnosis not present

## 2019-02-04 DIAGNOSIS — Z85828 Personal history of other malignant neoplasm of skin: Secondary | ICD-10-CM | POA: Diagnosis not present

## 2019-02-04 DIAGNOSIS — L814 Other melanin hyperpigmentation: Secondary | ICD-10-CM | POA: Diagnosis not present

## 2019-02-07 DIAGNOSIS — E1169 Type 2 diabetes mellitus with other specified complication: Secondary | ICD-10-CM | POA: Diagnosis not present

## 2019-02-07 DIAGNOSIS — I10 Essential (primary) hypertension: Secondary | ICD-10-CM | POA: Diagnosis not present

## 2019-02-07 DIAGNOSIS — Z1389 Encounter for screening for other disorder: Secondary | ICD-10-CM | POA: Diagnosis not present

## 2019-02-07 DIAGNOSIS — E782 Mixed hyperlipidemia: Secondary | ICD-10-CM | POA: Diagnosis not present

## 2019-02-07 DIAGNOSIS — G4733 Obstructive sleep apnea (adult) (pediatric): Secondary | ICD-10-CM | POA: Diagnosis not present

## 2019-02-07 DIAGNOSIS — I480 Paroxysmal atrial fibrillation: Secondary | ICD-10-CM | POA: Diagnosis not present

## 2019-02-07 DIAGNOSIS — Z0000 Encounter for general adult medical examination without abnormal findings: Secondary | ICD-10-CM | POA: Diagnosis not present

## 2019-02-07 DIAGNOSIS — H9313 Tinnitus, bilateral: Secondary | ICD-10-CM | POA: Diagnosis not present

## 2019-02-07 DIAGNOSIS — Z125 Encounter for screening for malignant neoplasm of prostate: Secondary | ICD-10-CM | POA: Diagnosis not present

## 2019-02-14 DIAGNOSIS — S40861A Insect bite (nonvenomous) of right upper arm, initial encounter: Secondary | ICD-10-CM | POA: Diagnosis not present

## 2019-04-04 DIAGNOSIS — Z20828 Contact with and (suspected) exposure to other viral communicable diseases: Secondary | ICD-10-CM | POA: Diagnosis not present

## 2019-04-23 DIAGNOSIS — E119 Type 2 diabetes mellitus without complications: Secondary | ICD-10-CM | POA: Diagnosis not present

## 2019-04-23 DIAGNOSIS — H3531 Nonexudative age-related macular degeneration: Secondary | ICD-10-CM | POA: Diagnosis not present

## 2019-04-23 DIAGNOSIS — H2513 Age-related nuclear cataract, bilateral: Secondary | ICD-10-CM | POA: Diagnosis not present

## 2019-05-01 DIAGNOSIS — L989 Disorder of the skin and subcutaneous tissue, unspecified: Secondary | ICD-10-CM | POA: Diagnosis not present

## 2019-08-07 DIAGNOSIS — D1801 Hemangioma of skin and subcutaneous tissue: Secondary | ICD-10-CM | POA: Diagnosis not present

## 2019-08-07 DIAGNOSIS — Z85828 Personal history of other malignant neoplasm of skin: Secondary | ICD-10-CM | POA: Diagnosis not present

## 2019-08-07 DIAGNOSIS — D2262 Melanocytic nevi of left upper limb, including shoulder: Secondary | ICD-10-CM | POA: Diagnosis not present

## 2019-08-07 DIAGNOSIS — L821 Other seborrheic keratosis: Secondary | ICD-10-CM | POA: Diagnosis not present

## 2019-08-07 DIAGNOSIS — D225 Melanocytic nevi of trunk: Secondary | ICD-10-CM | POA: Diagnosis not present

## 2019-08-07 DIAGNOSIS — D2261 Melanocytic nevi of right upper limb, including shoulder: Secondary | ICD-10-CM | POA: Diagnosis not present

## 2019-08-07 DIAGNOSIS — L570 Actinic keratosis: Secondary | ICD-10-CM | POA: Diagnosis not present

## 2019-08-07 DIAGNOSIS — L820 Inflamed seborrheic keratosis: Secondary | ICD-10-CM | POA: Diagnosis not present

## 2019-08-07 DIAGNOSIS — L814 Other melanin hyperpigmentation: Secondary | ICD-10-CM | POA: Diagnosis not present

## 2019-08-12 DIAGNOSIS — E1169 Type 2 diabetes mellitus with other specified complication: Secondary | ICD-10-CM | POA: Diagnosis not present

## 2019-08-12 DIAGNOSIS — E119 Type 2 diabetes mellitus without complications: Secondary | ICD-10-CM | POA: Diagnosis not present

## 2019-08-12 DIAGNOSIS — I10 Essential (primary) hypertension: Secondary | ICD-10-CM | POA: Diagnosis not present

## 2019-08-12 DIAGNOSIS — I480 Paroxysmal atrial fibrillation: Secondary | ICD-10-CM | POA: Diagnosis not present

## 2019-08-12 DIAGNOSIS — E782 Mixed hyperlipidemia: Secondary | ICD-10-CM | POA: Diagnosis not present

## 2019-08-13 ENCOUNTER — Telehealth: Payer: Self-pay | Admitting: Cardiology

## 2019-08-13 NOTE — Telephone Encounter (Signed)
Patient states that he was started on lisinopril 2.5 mg daily. He also was prescribed sidenafil 20 mg a while back but has been nervous to take it.

## 2019-08-13 NOTE — Telephone Encounter (Signed)
New message:     Patient is calling concerning some medications that he is now taking and would like to know if they are ok to take. Please call patient.

## 2019-08-22 ENCOUNTER — Ambulatory Visit: Payer: 59 | Admitting: Cardiology

## 2019-10-14 DIAGNOSIS — N529 Male erectile dysfunction, unspecified: Secondary | ICD-10-CM | POA: Diagnosis not present

## 2019-10-14 DIAGNOSIS — G4733 Obstructive sleep apnea (adult) (pediatric): Secondary | ICD-10-CM | POA: Diagnosis not present

## 2019-10-14 DIAGNOSIS — E782 Mixed hyperlipidemia: Secondary | ICD-10-CM | POA: Diagnosis not present

## 2019-10-14 DIAGNOSIS — Z7984 Long term (current) use of oral hypoglycemic drugs: Secondary | ICD-10-CM | POA: Diagnosis not present

## 2019-10-14 DIAGNOSIS — E1169 Type 2 diabetes mellitus with other specified complication: Secondary | ICD-10-CM | POA: Diagnosis not present

## 2019-10-14 DIAGNOSIS — I10 Essential (primary) hypertension: Secondary | ICD-10-CM | POA: Diagnosis not present

## 2019-11-16 DIAGNOSIS — I4891 Unspecified atrial fibrillation: Secondary | ICD-10-CM | POA: Diagnosis not present

## 2019-11-16 DIAGNOSIS — I519 Heart disease, unspecified: Secondary | ICD-10-CM | POA: Diagnosis not present

## 2019-11-16 DIAGNOSIS — T50901S Poisoning by unspecified drugs, medicaments and biological substances, accidental (unintentional), sequela: Secondary | ICD-10-CM | POA: Diagnosis not present

## 2019-12-16 DIAGNOSIS — E119 Type 2 diabetes mellitus without complications: Secondary | ICD-10-CM | POA: Diagnosis not present

## 2019-12-16 DIAGNOSIS — E1169 Type 2 diabetes mellitus with other specified complication: Secondary | ICD-10-CM | POA: Diagnosis not present

## 2019-12-16 DIAGNOSIS — I480 Paroxysmal atrial fibrillation: Secondary | ICD-10-CM | POA: Diagnosis not present

## 2019-12-16 DIAGNOSIS — I10 Essential (primary) hypertension: Secondary | ICD-10-CM | POA: Diagnosis not present

## 2019-12-16 DIAGNOSIS — E782 Mixed hyperlipidemia: Secondary | ICD-10-CM | POA: Diagnosis not present

## 2019-12-26 DIAGNOSIS — I480 Paroxysmal atrial fibrillation: Secondary | ICD-10-CM | POA: Diagnosis not present

## 2019-12-26 DIAGNOSIS — E1169 Type 2 diabetes mellitus with other specified complication: Secondary | ICD-10-CM | POA: Diagnosis not present

## 2019-12-26 DIAGNOSIS — I10 Essential (primary) hypertension: Secondary | ICD-10-CM | POA: Diagnosis not present

## 2019-12-26 DIAGNOSIS — E119 Type 2 diabetes mellitus without complications: Secondary | ICD-10-CM | POA: Diagnosis not present

## 2019-12-26 DIAGNOSIS — E782 Mixed hyperlipidemia: Secondary | ICD-10-CM | POA: Diagnosis not present

## 2020-01-09 ENCOUNTER — Other Ambulatory Visit: Payer: Self-pay | Admitting: Cardiology

## 2020-01-09 DIAGNOSIS — I480 Paroxysmal atrial fibrillation: Secondary | ICD-10-CM

## 2020-01-18 DIAGNOSIS — R0789 Other chest pain: Secondary | ICD-10-CM | POA: Diagnosis not present

## 2020-01-18 DIAGNOSIS — S29011A Strain of muscle and tendon of front wall of thorax, initial encounter: Secondary | ICD-10-CM | POA: Diagnosis not present

## 2020-01-26 ENCOUNTER — Other Ambulatory Visit: Payer: Self-pay

## 2020-01-26 ENCOUNTER — Encounter: Payer: Self-pay | Admitting: Cardiology

## 2020-01-26 ENCOUNTER — Ambulatory Visit: Payer: 59 | Admitting: Cardiology

## 2020-01-26 DIAGNOSIS — I480 Paroxysmal atrial fibrillation: Secondary | ICD-10-CM | POA: Diagnosis not present

## 2020-01-26 NOTE — Progress Notes (Signed)
Electrophysiology Office Note   Date:  01/26/2020   ID:  Craig Roberson, DOB 03/05/1956, MRN [id number]  PCP:  Cra[facility] @ [place]  Cardiologist:  Craig Roberson Primary Electrophysiologist:  Craig Marter Meredith Leeds, MD    No chief complaint on file.    History of Present Illness: Craig Roberson is a 64 y.o. male who presents today for electrophysiology evaluation.   He has a history of paroxsymal atrial fibrillation/flutter mild OSA, HLD and DM. An episode of A. fib with RVR in December after heavy caffeine use. He was started on Xarelto metoprolol at the time. When he was seen back in January, he was found to be in sinus rhythm with a rate of 63. He was continuing to have palpitations most days. He was started on flecainide 50 mg. he had an atrial fibrillation ablation on 09/28/16. He is continued on his flecainide since that time.   Today, denies symptoms of palpitations, chest pain, shortness of breath, orthopnea, PND, lower extremity edema, claudication, dizziness, presyncope, syncope, bleeding, or neurologic sequela. The patient is tolerating medications without difficulties.  He overall is doing well.  He had no chest pain or shortness of breath.  Approximately 9 days ago, he was working in the yard and strained the muscles in the left side of his chest.  He feels they are improving.  He was diagnosed with a serratus anterior strain.  Past Medical History:  Diagnosis Date  . Allergy   . Arthritis    bilateral thumbs  . BCC (basal cell carcinoma of skin) 04/2006   LEFT low back  . Cardiac murmur 1996   normal ECHO  . Diabetes mellitus without complication ([place])   . Diverticulosis   . Dry eye 08/14/2017  . ED (erectile dysfunction) 2006  . Gastroparesis 08/14/2017  . H/O ETOH abuse    Quit 2006  . Hernia    RIGHT inguinal  . Hyperlipidemia   . Macular degeneration   . PAF (paroxysmal atrial fibrillation) ([place]) 04/15/2015   During exercise myoview  .  Tinnitus of both ears   . Ulcer    Past Surgical History:  Procedure Laterality Date  . ATRIAL FIBRILLATION ABLATION N/A 09/28/2016   Procedure: Atrial Fibrillation Ablation;  Surgeon: Craig Roadcap Meredith Leeds, MD;  Location: [facility];  Service: Cardiovascular;  Laterality: N/A;  . GANGLION CYST EXCISION  1970's  . LUMBAR FUSION     L3-4; Dr. Ellene Roberson  . SPINE SURGERY    . THUMB FUSION     BILATERAL  . VASECTOMY  1976     Current Outpatient Medications  Medication Sig Dispense Refill  . atorvastatin (LIPITOR) 80 MG tablet TAKE 1 TABLET (80 MG TOTAL) BY MOUTH DAILY. 90 tablet 3  . azelastine (ASTELIN) 0.1 % nasal spray Place 1 spray into both nostrils 2 (two) times daily as needed for rhinitis. Use in each nostril as directed    . cholecalciferol (VITAMIN D) 1000 UNITS tablet Take 1,000 Units by mouth daily.    . Continuous Blood Gluc Sensor ([place]) MISC See admin instructions.  3  . diazepam (VALIUM) 10 MG tablet Take 1 tablet (10 mg total) by mouth at bedtime as needed (tinnitus). 30 tablet 0  . ezetimibe (ZETIA) 10 MG tablet TAKE 1 TABLET (10 MG TOTAL) BY MOUTH DAILY. 90 tablet 3  . glucose monitoring kit (FREESTYLE) monitoring kit 1 each by Does not apply Roberson as needed for other. 1 each 1  .  lisinopril (ZESTRIL) 2.5 MG tablet Take 2.5 mg by mouth daily.    . metFORMIN (GLUCOPHAGE) 500 MG tablet Take 2 tablets (1,000 mg total) by mouth 2 (two) times daily with a meal. 360 tablet 3  . metoprolol tartrate (LOPRESSOR) 25 MG tablet TAKE 1 TABLET (25 MG TOTAL) BY MOUTH 2 (TWO) TIMES DAILY. Please make overdue appt with Dr Craig Roberson before anymore refills. 1st attempt 180 tablet 3  . Omega-3 Fatty Acids (FISH OIL) 1200 MG CAPS Take 1,200 mg by mouth daily. Reported on 10/08/2015    . Saw Palmetto 450 MG CAPS Take 1 capsule by mouth daily.    Marland Kitchen SHINGRIX injection TO BE ADMINISTERED BY PHARMACIST FOR IMMUNIZATION  0  . sildenafil (REVATIO) 20 MG tablet TAKE 2-5  TABLETS BY MOUTH 1 HOUR BEFORE INTERCOURSE  11   No current facility-administered medications for this visit.    Allergies:   Codeine, Hydrocodone-acetaminophen, Methocarbamol, Morphine and related, and Nsaids   Social History:  The patient  reports that he has quit smoking. He has quit using smokeless tobacco.  His smokeless tobacco use included chew. He reports that he does not drink alcohol and does not use drugs.   Family History:  The patient's family history includes Atrial fibrillation in his brother; Cancer in his father; Heart attack in his father; Heart disease (age of onset: 64) in his father; Heart disease (age of onset: 34) in his brother; Hyperlipidemia in his mother; Hypertension in his brother; Stroke in his father; Stroke (age of onset: 102) in his son; Vascular Disease in his brother.   ROS:  Please see the history of present illness.   Otherwise, review of systems is positive for none.   All other systems are reviewed and negative.   PHYSICAL EXAM: VS:  BP 126/74   Pulse 60   Ht 6' (1.829 m)   Wt (!) 238 lb (108 kg)   SpO2 97%   BMI 32.28 kg/m  , BMI Body mass index is 32.28 kg/m. GEN: Well nourished, well developed, in no acute distress  HEENT: normal  Neck: no JVD, carotid bruits, or masses Cardiac: RRR; no murmurs, rubs, or gallops,no edema  Respiratory:  clear to auscultation bilaterally, normal work of breathing GI: soft, nontender, nondistended, + BS MS: no deformity or atrophy  Skin: warm and dry Neuro:  Strength and sensation are intact Psych: euthymic mood, full affect  EKG:  EKG is ordered today. Personal review of the ekg ordered shows sinus rhythm  Recent Labs: No results found for requested labs within last 8760 hours.    Lipid Panel     Component Value Date/Time   CHOL 136 08/14/2017 0843   TRIG 68 08/14/2017 0843   HDL 41 08/14/2017 0843   CHOLHDL 3.3 08/14/2017 0843   CHOLHDL 4.1 02/22/2016 1237   VLDL 25 02/22/2016 1237   LDLCALC 81  08/14/2017 0843     Wt Readings from Last 3 Encounters:  01/26/20 (!) 238 lb (108 kg)  01/21/19 245 lb (111.1 kg)  11/22/17 249 lb 3.2 oz (113 kg)      Other studies Reviewed: Additional studies/ records that were reviewed today include: TTE 04/22/15  Review of the above records today demonstrates:  - Left ventricle: The cavity size was normal. There was mild focal   basal hypertrophy of the septum. Systolic function was normal.   The estimated ejection fraction was in the range of 55% to 60%.   Wall motion was normal; there were no  regional wall motion   abnormalities. Left ventricular diastolic function parameters   were normal. - Right ventricle: The cavity size was normal. Wall thickness was   normal. Systolic function was normal. - Atrial septum: No defect or patent foramen ovale was identified. - Tricuspid valve: There was no regurgitation. - Pulmonic valve: There was trivial regurgitation. - Inferior vena cava: The vessel was normal in size. The   respirophasic diameter changes were in the normal range (>= 50%),   consistent with normal central venous pressure.   ASSESSMENT AND PLAN:  1.  Proximal atrial fibrillation/atrial flutter: Status post ablation 09/28/2016.  Currently on flecainide and metoprolol.  Remains in sinus rhythm.  CHA2DS2-VASc of 2.  He has not had much in the way of atrial fibrillation since his ablation in 2018.  To that, we Hanae Waiters plan to stop his flecainide today.  Call us back if he starts to have more frequent palpitations.   2. Hyperlipidemia: Continue atorvastatin  3. OSA: CPAP compliance encouraged   Current medicines are reviewed at length with the patient today.   The patient does not have concerns regarding his medicines.  The following changes were made today: Stop flecainide  Labs/ tests ordered today include:  Orders Placed This Encounter  Procedures  . EKG 12-Lead     Disposition:   FU with Brilynn Biasi 12 months  Signed, Elif Yonts  Meredith Leeds, MD  01/26/2020 9:31 AM     [facility] [address] [place] [address] [phone] (office) [phone] (fax)

## 2020-01-26 NOTE — Patient Instructions (Signed)
Medication Instructions:   ** Stop Flecainide  *If you need a refill on your cardiac medications before your next appointment, please call your pharmacy*   Lab Work: None ordered.  If you have labs (blood work) drawn today and your tests are completely normal, you will receive your results only by: Marland Kitchen MyChart Message (if you have MyChart) OR . A paper copy in the mail If you have any lab test that is abnormal or we need to change your treatment, we will call you to review the results.   Testing/Procedures: None ordered.    Follow-Up: At [facility], you and your health needs are our priority.  As part of our continuing mission to provide you with exceptional heart care, we have created designated Provider Care Teams.  These Care Teams include your primary Cardiologist (physician) and Advanced Practice Providers (APPs -  Physician Assistants and Nurse Practitioners) who all work together to provide you with the care you need, when you need it.  We recommend signing up for the patient portal called "MyChart".  Sign up information is provided on this After Visit Summary.  MyChart is used to connect with patients for Virtual Visits (Telemedicine).  Patients are able to view lab/test results, encounter notes, upcoming appointments, etc.  Non-urgent messages can be sent to your provider as well.   To learn more about what you can do with MyChart, go to [email or web address].    Your next appointment:   12 month(s)  The format for your next appointment:   In Person  Provider:   Allegra Lai, MD

## 2020-01-28 DIAGNOSIS — M546 Pain in thoracic spine: Secondary | ICD-10-CM | POA: Diagnosis not present

## 2020-01-28 DIAGNOSIS — M62830 Muscle spasm of back: Secondary | ICD-10-CM | POA: Diagnosis not present

## 2020-02-04 DIAGNOSIS — L99 Other disorders of skin and subcutaneous tissue in diseases classified elsewhere: Secondary | ICD-10-CM | POA: Diagnosis not present

## 2020-02-04 DIAGNOSIS — D225 Melanocytic nevi of trunk: Secondary | ICD-10-CM | POA: Diagnosis not present

## 2020-02-04 DIAGNOSIS — Z85828 Personal history of other malignant neoplasm of skin: Secondary | ICD-10-CM | POA: Diagnosis not present

## 2020-02-04 DIAGNOSIS — L570 Actinic keratosis: Secondary | ICD-10-CM | POA: Diagnosis not present

## 2020-02-04 DIAGNOSIS — L820 Inflamed seborrheic keratosis: Secondary | ICD-10-CM | POA: Diagnosis not present

## 2020-02-04 DIAGNOSIS — D1801 Hemangioma of skin and subcutaneous tissue: Secondary | ICD-10-CM | POA: Diagnosis not present

## 2020-02-04 DIAGNOSIS — L814 Other melanin hyperpigmentation: Secondary | ICD-10-CM | POA: Diagnosis not present

## 2020-02-04 DIAGNOSIS — D485 Neoplasm of uncertain behavior of skin: Secondary | ICD-10-CM | POA: Diagnosis not present

## 2020-02-04 DIAGNOSIS — E854 Organ-limited amyloidosis: Secondary | ICD-10-CM | POA: Diagnosis not present

## 2020-02-04 DIAGNOSIS — L821 Other seborrheic keratosis: Secondary | ICD-10-CM | POA: Diagnosis not present

## 2020-02-10 DIAGNOSIS — E782 Mixed hyperlipidemia: Secondary | ICD-10-CM | POA: Diagnosis not present

## 2020-02-10 DIAGNOSIS — C4490 Unspecified malignant neoplasm of skin, unspecified: Secondary | ICD-10-CM | POA: Diagnosis not present

## 2020-02-10 DIAGNOSIS — E1169 Type 2 diabetes mellitus with other specified complication: Secondary | ICD-10-CM | POA: Diagnosis not present

## 2020-02-10 DIAGNOSIS — Z0000 Encounter for general adult medical examination without abnormal findings: Secondary | ICD-10-CM | POA: Diagnosis not present

## 2020-02-10 DIAGNOSIS — N529 Male erectile dysfunction, unspecified: Secondary | ICD-10-CM | POA: Diagnosis not present

## 2020-02-10 DIAGNOSIS — Z125 Encounter for screening for malignant neoplasm of prostate: Secondary | ICD-10-CM | POA: Diagnosis not present

## 2020-02-10 DIAGNOSIS — F5101 Primary insomnia: Secondary | ICD-10-CM | POA: Diagnosis not present

## 2020-02-10 DIAGNOSIS — I10 Essential (primary) hypertension: Secondary | ICD-10-CM | POA: Diagnosis not present

## 2020-02-10 DIAGNOSIS — I480 Paroxysmal atrial fibrillation: Secondary | ICD-10-CM | POA: Diagnosis not present

## 2020-02-11 ENCOUNTER — Other Ambulatory Visit: Payer: Self-pay | Admitting: Cardiology

## 2020-02-12 ENCOUNTER — Telehealth: Payer: Self-pay | Admitting: Hematology

## 2020-02-12 ENCOUNTER — Other Ambulatory Visit: Payer: Self-pay | Admitting: Cardiology

## 2020-02-12 DIAGNOSIS — I480 Paroxysmal atrial fibrillation: Secondary | ICD-10-CM

## 2020-02-12 NOTE — Telephone Encounter (Signed)
Received a new pt referral from Dr. Elvera Lennox from [facility] for amyloidosis. Craig Roberson has been cld and scheduled to see Dr. Irene Limbo on 8/25 at 11am. Pt aware to arrive 15 minutes early.

## 2020-02-18 ENCOUNTER — Other Ambulatory Visit: Payer: Self-pay

## 2020-02-18 ENCOUNTER — Inpatient Hospital Stay: Payer: Medicare HMO | Attending: Hematology | Admitting: Hematology

## 2020-02-18 ENCOUNTER — Inpatient Hospital Stay: Payer: Medicare HMO

## 2020-02-18 VITALS — BP 138/61 | HR 75 | Temp 97.8°F | Resp 18 | Ht 72.0 in | Wt 236.4 lb

## 2020-02-18 DIAGNOSIS — E854 Organ-limited amyloidosis: Secondary | ICD-10-CM

## 2020-02-18 DIAGNOSIS — Z79899 Other long term (current) drug therapy: Secondary | ICD-10-CM | POA: Insufficient documentation

## 2020-02-18 DIAGNOSIS — E785 Hyperlipidemia, unspecified: Secondary | ICD-10-CM | POA: Diagnosis not present

## 2020-02-18 DIAGNOSIS — E119 Type 2 diabetes mellitus without complications: Secondary | ICD-10-CM | POA: Diagnosis not present

## 2020-02-18 DIAGNOSIS — Z7984 Long term (current) use of oral hypoglycemic drugs: Secondary | ICD-10-CM

## 2020-02-18 DIAGNOSIS — I480 Paroxysmal atrial fibrillation: Secondary | ICD-10-CM | POA: Insufficient documentation

## 2020-02-18 DIAGNOSIS — L99 Other disorders of skin and subcutaneous tissue in diseases classified elsewhere: Secondary | ICD-10-CM

## 2020-02-18 DIAGNOSIS — Z87891 Personal history of nicotine dependence: Secondary | ICD-10-CM | POA: Diagnosis not present

## 2020-02-18 DIAGNOSIS — E859 Amyloidosis, unspecified: Secondary | ICD-10-CM

## 2020-02-18 LAB — CMP (CANCER CENTER ONLY)
ALT: 24 U/L (ref 0–44)
AST: 22 U/L (ref 15–41)
Albumin: 4.3 g/dL (ref 3.5–5.0)
Alkaline Phosphatase: 71 U/L (ref 38–126)
Anion gap: 7 (ref 5–15)
BUN: 14 mg/dL (ref 8–23)
CO2: 25 mmol/L (ref 22–32)
Calcium: 10.2 mg/dL (ref 8.9–10.3)
Chloride: 106 mmol/L (ref 98–111)
Creatinine: 0.88 mg/dL (ref 0.61–1.24)
GFR, Est AFR Am: >60 mL/min (ref >60)
GFR, Estimated: >60 mL/min (ref >60)
Glucose, Bld: 104 mg/dL — ABNORMAL HIGH (ref 70–99)
Potassium: 4.1 mmol/L (ref 3.5–5.1)
Sodium: 138 mmol/L (ref 135–145)
Total Bilirubin: 0.6 mg/dL (ref 0.3–1.2)
Total Protein: 7.4 g/dL (ref 6.5–8.1)

## 2020-02-18 LAB — CBC WITH DIFFERENTIAL/PLATELET
Abs Immature Granulocytes: 0.01 10*3/uL (ref 0.00–0.07)
Basophils Absolute: 0 10*3/uL (ref 0.0–0.1)
Basophils Relative: 1 %
Eosinophils Absolute: 0.2 10*3/uL (ref 0.0–0.5)
Eosinophils Relative: 4 %
HCT: 40.7 % (ref 39.0–52.0)
Hemoglobin: 13.9 g/dL (ref 13.0–17.0)
Immature Granulocytes: 0 %
Lymphocytes Relative: 28 %
Lymphs Abs: 1.7 10*3/uL (ref 0.7–4.0)
MCH: 32.1 pg (ref 26.0–34.0)
MCHC: 34.2 g/dL (ref 30.0–36.0)
MCV: 94 fL (ref 80.0–100.0)
Monocytes Absolute: 0.9 10*3/uL (ref 0.1–1.0)
Monocytes Relative: 14 %
Neutro Abs: 3.3 10*3/uL (ref 1.7–7.7)
Neutrophils Relative %: 53 %
Platelets: 365 10*3/uL (ref 150–400)
RBC: 4.33 MIL/uL (ref 4.22–5.81)
RDW: 12.7 % (ref 11.5–15.5)
WBC: 6.2 10*3/uL (ref 4.0–10.5)
nRBC: 0 % (ref 0.0–0.2)

## 2020-02-18 LAB — SEDIMENTATION RATE: Sed Rate: 9 mm/hr (ref 0–16)

## 2020-02-18 NOTE — Progress Notes (Signed)
HEMATOLOGY/ONCOLOGY CONSULTATION NOTE  Date of Service: 02/18/2020  Patient Care Team: [facility] as PCP - General (Family Medicine) Constance Haw, MD as PCP - Electrophysiology (Cardiology) Kristeen Miss, MD as Attending Physician (Neurosurgery) Roseanne Kaufman, MD as Attending Physician (Orthopedic Surgery) Laurence Spates, MD (Inactive) as Attending Physician (Gastroenterology) Sueanne Margarita, MD as Consulting Physician (Cardiology) Katy Apo, MD as Consulting Physician (Ophthalmology)  CHIEF COMPLAINTS/PURPOSE OF CONSULTATION:  Amyloidosis  HISTORY OF PRESENTING ILLNESS:   Craig Roberson is a wonderful 63 y.o. male who has been referred to [place] by Dr. Elvera Lennox for evaluation and management of amyloidosis. Pt is accompanied today by his wife. The pt reports that he is doing well overall.   The pt reports he had a blister along the left side of his lower lip. The blister appeared to be filled with fluid and lasted for 5 months. He went to see his Dermatologist, who drained the skin and sent the results to pathology. Pt has no other areas of skin that appear similar to the the blister. He has had multiple SCC and BCC and visits his Dermatologist every 6 months. His last skin cancer lesion was found around a year ago.   For a few months pt had localized right abdominal pain that was never explained. It disappeared with out any changes or intervention. He has not noticed any increase in the size of his tongue or vocal changes.   He has had a Cardiac ablation for his Atrial fibrillation and was on Flecainide for a time. He had an EKG earlier this month that was okay, but does not remember the last time he received an ECHO. Pt is currently taking Metformin for his Diabetes. Pt has arthritis and has never been diagnosed with RA. He has had a bilateral thumb fusion. He has Diverticulosis, but does not remember being diagnosed with Gastroparesis.  Pt has macular degeneration.   Of note prior to the patient's visit today, pt has had Left inferior medial angle of the mouth shave completed on 02/04/2020 with results revealing " Cutaneous Amyloidosis, Nodular Type".   On review of systems, pt denies abdominal pain, vocal changes, new skin lesions and any other symptoms.   On PMHx the pt reports Arthritis, BCC, SCC, Diabetes mellitus, Diverticulosis, Macular degeneration, PAF, Atrial Fibrillation Ablation, Thumb Fusion b/l. On Social Hx the pt reports that he used chewing tobacco for 50 years. Pt also used to be heavy drinker but quit in 2005. On Family Hx the pt reports that his father passed from Prostate cancer.   MEDICAL HISTORY:  Past Medical History:  Diagnosis Date  . Allergy   . Arthritis    bilateral thumbs  . BCC (basal cell carcinoma of skin) 04/2006   LEFT low back  . Cardiac murmur 1996   normal ECHO  . Diabetes mellitus without complication ([place])   . Diverticulosis   . Dry eye 08/14/2017  . ED (erectile dysfunction) 2006  . Gastroparesis 08/14/2017  . H/O ETOH abuse    Quit 2006  . Hernia    RIGHT inguinal  . Hyperlipidemia   . Macular degeneration   . PAF (paroxysmal atrial fibrillation) ([place]) 04/15/2015   During exercise myoview  . Tinnitus of both ears   . Ulcer     SURGICAL HISTORY: Past Surgical History:  Procedure Laterality Date  . ATRIAL FIBRILLATION ABLATION N/A 09/28/2016   Procedure: Atrial Fibrillation Ablation;  Surgeon: Will Meredith Leeds, MD;  Location:  [facility];  Service: Cardiovascular;  Laterality: N/A;  . GANGLION CYST EXCISION  1970's  . LUMBAR FUSION     L3-4; Dr. Ellene Route  . SPINE SURGERY    . THUMB FUSION     BILATERAL  . VASECTOMY  1976    SOCIAL HISTORY: Social History   Socioeconomic History  . Marital status: Married    Spouse name: Craig Roberson  . Number of children: 2  . Years of education: 8  . Highest education level: Not on file  Occupational History  .  Occupation: Retired    Fish farm manager: RETIRED    Comment: [employer]  Tobacco Use  . Smoking status: Former Research scientist (life sciences)  . Smokeless tobacco: Former Systems developer    Types: Chew  . Tobacco comment: some days  Vaping Use  . Vaping Use: Never used  Substance and Sexual Activity  . Alcohol use: No    Alcohol/week: 0.0 standard drinks  . Drug use: No  . Sexual activity: Not Currently    Partners: Female    Comment: family stress; his back pain have reduced the frequency  Other Topics Concern  . Not on file  Social History Narrative   Married to 3rd wife.   Social Determinants of Health   Financial Resource Strain:   . Difficulty of Paying Living Expenses: Not on file  Food Insecurity:   . Worried About Charity fundraiser in the Last Year: Not on file  . Ran Out of Food in the Last Year: Not on file  Transportation Needs:   . Lack of Transportation (Medical): Not on file  . Lack of Transportation (Non-Medical): Not on file  Physical Activity:   . Days of Exercise per Week: Not on file  . Minutes of Exercise per Session: Not on file  Stress:   . Feeling of Stress : Not on file  Social Connections:   . Frequency of Communication with Friends and Family: Not on file  . Frequency of Social Gatherings with Friends and Family: Not on file  . Attends Religious Services: Not on file  . Active Member of Clubs or Organizations: Not on file  . Attends Archivist Meetings: Not on file  . Marital Status: Not on file  Intimate Partner Violence:   . Fear of Current or Ex-Partner: Not on file  . Emotionally Abused: Not on file  . Physically Abused: Not on file  . Sexually Abused: Not on file    FAMILY HISTORY: Family History  Problem Relation Age of Onset  . Heart disease Brother 28       AMI, normal weight, normal cholesterol  . Heart disease Father 36  . Cancer Father        Prostate  . Stroke Father   . Heart attack Father   . Stroke Son 37  . Hyperlipidemia Mother     . Atrial fibrillation Brother   . Vascular Disease Brother        carotid artery disease  . Hypertension Brother     ALLERGIES:  is allergic to codeine, hydrocodone-acetaminophen, methocarbamol, morphine and related, and nsaids.  MEDICATIONS:  Current Outpatient Medications  Medication Sig Dispense Refill  . atorvastatin (LIPITOR) 80 MG tablet TAKE 1 TABLET (80 MG TOTAL) BY MOUTH DAILY. 90 tablet 3  . azelastine (ASTELIN) 0.1 % nasal spray Place 1 spray into both nostrils 2 (two) times daily as needed for rhinitis. Use in each nostril as directed    . cholecalciferol (VITAMIN D)  1000 UNITS tablet Take 1,000 Units by mouth daily.    . Continuous Blood Gluc Sensor ([place]) MISC See admin instructions.  3  . diazepam (VALIUM) 10 MG tablet Take 1 tablet (10 mg total) by mouth at bedtime as needed (tinnitus). 30 tablet 0  . ezetimibe (ZETIA) 10 MG tablet TAKE 1 TABLET (10 MG TOTAL) BY MOUTH DAILY. 90 tablet 3  . glucose monitoring kit (FREESTYLE) monitoring kit 1 each by Does not apply route as needed for other. 1 each 1  . lisinopril (ZESTRIL) 2.5 MG tablet Take 2.5 mg by mouth daily.    . metFORMIN (GLUCOPHAGE) 500 MG tablet Take 2 tablets (1,000 mg total) by mouth 2 (two) times daily with a meal. 360 tablet 3  . metoprolol tartrate (LOPRESSOR) 25 MG tablet TAKE 1 TABLET TWO TIMES DAILY. 180 tablet 3  . Omega-3 Fatty Acids (FISH OIL) 1200 MG CAPS Take 1,200 mg by mouth daily. Reported on 10/08/2015    . Saw Palmetto 450 MG CAPS Take 1 capsule by mouth daily.    Marland Kitchen SHINGRIX injection TO BE ADMINISTERED BY PHARMACIST FOR IMMUNIZATION  0  . sildenafil (REVATIO) 20 MG tablet TAKE 2-5 TABLETS BY MOUTH 1 HOUR BEFORE INTERCOURSE  11   No current facility-administered medications for this visit.    REVIEW OF SYSTEMS:    10 Point review of Systems was done is negative except as noted above.  PHYSICAL EXAMINATION: ECOG PERFORMANCE STATUS: 0 - Asymptomatic  . Vitals:    02/18/20 1132  BP: 138/61  Pulse: 75  Resp: 18  Temp: 97.8 F (36.6 C)  SpO2: 98%   Filed Weights   02/18/20 1132  Weight: 236 lb 6.4 oz (107.2 kg)   .Body mass index is 32.06 kg/m.  GENERAL:alert, in no acute distress and comfortable SKIN: no acute rashes, no significant lesions EYES: conjunctiva are pink and non-injected, sclera anicteric OROPHARYNX: MMM, no exudates, no oropharyngeal erythema or ulceration NECK: supple, no JVD LYMPH:  no palpable lymphadenopathy in the cervical, axillary or inguinal regions LUNGS: clear to auscultation b/l with normal respiratory effort HEART: regular rate & rhythm ABDOMEN:  normoactive bowel sounds , non tender, not distended. Extremity: no pedal edema PSYCH: alert & oriented x 3 with fluent speech NEURO: no focal motor/sensory deficits  LABORATORY DATA:  I have reviewed the data as listed  . CBC Latest Ref Rng & Units 02/18/2020 05/15/2017 09/15/2016  WBC 4.0 - 10.5 K/uL 6.2 7.3 8.4  Hemoglobin 13.0 - 17.0 g/dL 13.9 14.6 14.8  Hematocrit 39 - 52 % 40.7 42.4 43.0  Platelets 150 - 400 K/uL 365 321 338    . CMP Latest Ref Rng & Units 02/18/2020 08/14/2017 05/15/2017  Glucose 70 - 99 mg/dL 104(H) 110(H) 110(H)  BUN 8 - 23 mg/dL _0 Creatinine 0.61 - 1.24 mg/dL 0.88 1.02 0.99  Sodium 135 - 145 mmol/L 138 140 143  Potassium 3.5 - 5.1 mmol/L 4.1 4.7 5.0  Chloride 98 - 111 mmol/L 106 104 103  CO2 22 - 32 mmol/L 25 19(L) 27  Calcium 8.9 - 10.3 mg/dL 10.2 9.5 9.3  Total Protein 6.5 - 8.1 g/dL 7.4 7.4 6.8  Total Bilirubin 0.3 - 1.2 mg/dL 0.6 0.4 0.5  Alkaline Phos 38 - 126 U/L 71 90 97  AST 15 - 41 U/L _1 ALT 0 - 44 U/L _2 RADIOGRAPHIC STUDIES: I have personally reviewed the radiological images as  listed and agreed with the findings in the report. No results found.  ASSESSMENT & PLAN:   64 yo with   1) Cutaneous Amyloidosis - likely primary cutaneous nodular type. R/o Secondary Amyloidosis of skini with  systemic amyloidosis  PLAN: -Discussed 02/04/2020 Left inferior medial angle of the mouth shave which revealed " Cutaneous Amyloidosis, Nodular Type". -Advised pt that cutaneous amyloid could be secondary to chronic inflammation.  -Advised pt that it is most likely that his cutaneous amyloid is a localized process and is not involving any greater organ systems.  -Advised pt that it is less likely that this is a part of a broader process that can deposit amyloid in other organs (Light Chain Amyloidosis). -Advised pt that there can be genetic pre-disposition to Amyloidosis.  -Recommend pt use Vaseline or a low-dose steroid, like Hydrocortisone, on his previously blistered area. -Advised pt that w/o would include ECHO, 24-hr UPEP, and blood tests. -Will get labs today  -Will get ECHO in 1 week -Will see back In 3 weeks via phone   FOLLOW UP: Labs today ECHO in 1 week Phone visit with Dr Irene Limbo in 3 weeks  . Orders Placed This Encounter  Procedures  . CBC with Differential/Platelet    Standing Status:   Future    Number of Occurrences:   1    Standing Expiration Date:   02/17/2021  . CMP ([facility] only)    Standing Status:   Future    Number of Occurrences:   1    Standing Expiration Date:   02/17/2021  . Multiple Myeloma Panel (SPEP&IFE w/QIG)    Standing Status:   Future    Number of Occurrences:   1    Standing Expiration Date:   02/17/2021  . Kappa/lambda light chains    Standing Status:   Future    Number of Occurrences:   1    Standing Expiration Date:   02/17/2021  . Sedimentation rate    Standing Status:   Future    Number of Occurrences:   1    Standing Expiration Date:   02/17/2021  . 24-Hr Ur UPEP/UIFE/Light Chains/TP    Standing Status:   Future    Number of Occurrences:   1    Standing Expiration Date:   02/17/2021  . ECHOCARDIOGRAM COMPLETE    Standing Status:   Future    Standing Expiration Date:   02/17/2021    Order Specific Question:   Where should this test  be performed    Answer:   [facility]    Order Specific Question:   Perflutren DEFINITY (image enhancing agent) should be administered unless hypersensitivity or allergy exist    Answer:   Administer Perflutren    Order Specific Question:   Reason for exam-Echo    Answer:   Cardiomyopathy-Unspecified  425.9 / I42.9    Order Specific Question:   Other Comments    Answer:   patient with cutaneous Amyloidosis to r/o systemic cardiac involvement    All of the patients questions were answered with apparent satisfaction. The patient knows to call the clinic with any problems, questions or concerns.  I spent 40 mins counseling the patient face to face. The total time spent in the appointment was 60 minutes and more than 50% was on counseling and direct patient cares.    Sullivan Lone MD [place] [facility] Hematology/Oncology Physician [facility]  (Office):       [phone] (Work cell):  [phone] (Fax):           [phone]  02/18/2020 4:54 PM  I, Yevette Edwards, am acting as a scribe for Dr. Sullivan Lone.   .I have reviewed the above documentation for accuracy and completeness, and I agree with the above. Brunetta Genera MD

## 2020-02-19 LAB — MULTIPLE MYELOMA PANEL, SERUM
Albumin SerPl Elph-Mcnc: 4 g/dL (ref 2.9–4.4)
Albumin/Glob SerPl: 1.3 (ref 0.7–1.7)
Alpha 1: 0.2 g/dL (ref 0.0–0.4)
Alpha2 Glob SerPl Elph-Mcnc: 0.8 g/dL (ref 0.4–1.0)
B-Globulin SerPl Elph-Mcnc: 1.1 g/dL (ref 0.7–1.3)
Gamma Glob SerPl Elph-Mcnc: 1.2 g/dL (ref 0.4–1.8)
Globulin, Total: 3.3 g/dL (ref 2.2–3.9)
IgA: 333 mg/dL (ref 61–437)
IgG (Immunoglobin G), Serum: 962 mg/dL (ref 603–1613)
IgM (Immunoglobulin M), Srm: 36 mg/dL (ref 20–172)
Total Protein ELP: 7.3 g/dL (ref 6.0–8.5)

## 2020-02-19 LAB — KAPPA/LAMBDA LIGHT CHAINS
Kappa free light chain: 28.2 mg/L — ABNORMAL HIGH (ref 3.3–19.4)
Kappa, lambda light chain ratio: 1.52 (ref 0.26–1.65)
Lambda free light chains: 18.6 mg/L (ref 5.7–26.3)

## 2020-02-20 ENCOUNTER — Telehealth: Payer: Self-pay | Admitting: *Deleted

## 2020-02-20 NOTE — Telephone Encounter (Signed)
Called pt and left vm of date and time of ECHO 9/2 at [place] if changes to schedule needs to be made to call [facility] and provided phone number

## 2020-02-23 DIAGNOSIS — Z7984 Long term (current) use of oral hypoglycemic drugs: Secondary | ICD-10-CM | POA: Diagnosis not present

## 2020-02-23 DIAGNOSIS — Z87891 Personal history of nicotine dependence: Secondary | ICD-10-CM | POA: Diagnosis not present

## 2020-02-23 DIAGNOSIS — E785 Hyperlipidemia, unspecified: Secondary | ICD-10-CM | POA: Diagnosis not present

## 2020-02-23 DIAGNOSIS — E119 Type 2 diabetes mellitus without complications: Secondary | ICD-10-CM | POA: Diagnosis not present

## 2020-02-23 DIAGNOSIS — E859 Amyloidosis, unspecified: Secondary | ICD-10-CM | POA: Diagnosis not present

## 2020-02-23 DIAGNOSIS — I480 Paroxysmal atrial fibrillation: Secondary | ICD-10-CM | POA: Diagnosis not present

## 2020-02-23 DIAGNOSIS — Z79899 Other long term (current) drug therapy: Secondary | ICD-10-CM | POA: Diagnosis not present

## 2020-02-25 LAB — UPEP/UIFE/LIGHT CHAINS/TP, 24-HR UR
% BETA, Urine: 0 %
ALPHA 1 URINE: 0 %
Albumin, U: 100 %
Alpha 2, Urine: 0 %
Free Kappa Lt Chains,Ur: 16.07 mg/L (ref 0.63–113.79)
Free Kappa/Lambda Ratio: 11.32 (ref 1.03–31.76)
Free Lambda Lt Chains,Ur: 1.42 mg/L (ref 0.47–11.77)
GAMMA GLOBULIN URINE: 0 %
Total Protein, Urine-Ur/day: 143 mg/24 hr (ref 30–150)
Total Protein, Urine: 11 mg/dL
Total Volume: 1300

## 2020-02-26 ENCOUNTER — Other Ambulatory Visit: Payer: Self-pay

## 2020-02-26 ENCOUNTER — Ambulatory Visit (HOSPITAL_COMMUNITY)
Admission: RE | Admit: 2020-02-26 | Discharge: 2020-02-26 | Disposition: A | Discharge status: Home / Self Care | Payer: Medicare HMO | Source: Ambulatory Visit | Attending: Hematology | Admitting: Hematology

## 2020-02-26 DIAGNOSIS — I429 Cardiomyopathy, unspecified: Secondary | ICD-10-CM | POA: Insufficient documentation

## 2020-02-26 DIAGNOSIS — I428 Other cardiomyopathies: Secondary | ICD-10-CM | POA: Diagnosis not present

## 2020-02-26 DIAGNOSIS — E854 Organ-limited amyloidosis: Secondary | ICD-10-CM | POA: Diagnosis not present

## 2020-02-26 DIAGNOSIS — L99 Other disorders of skin and subcutaneous tissue in diseases classified elsewhere: Secondary | ICD-10-CM | POA: Diagnosis not present

## 2020-02-26 LAB — ECHOCARDIOGRAM COMPLETE
Area-P 1/2: 2.73 cm2
S' Lateral: 3 cm

## 2020-02-26 NOTE — Progress Notes (Signed)
Echocardiogram 2D Echocardiogram has been performed.  Oneal Deputy Wendie Diskin 02/26/2020, 10:40 AM

## 2020-02-27 ENCOUNTER — Telehealth: Payer: Self-pay | Admitting: Hematology

## 2020-02-27 NOTE — Telephone Encounter (Signed)
Release: [id number] Faxed medical records to [facility] @ fax#[phone]

## 2020-03-04 DIAGNOSIS — E859 Amyloidosis, unspecified: Secondary | ICD-10-CM | POA: Diagnosis not present

## 2020-03-04 DIAGNOSIS — Z1211 Encounter for screening for malignant neoplasm of colon: Secondary | ICD-10-CM | POA: Diagnosis not present

## 2020-03-05 ENCOUNTER — Telehealth: Payer: Self-pay | Admitting: Hematology

## 2020-03-05 NOTE — Telephone Encounter (Signed)
Called patient to confirm upcoming appointment on 9/21. Patient is aware of appointment and that it is a phone visit.

## 2020-03-15 ENCOUNTER — Telehealth: Payer: Self-pay | Admitting: Hematology

## 2020-03-15 NOTE — Telephone Encounter (Signed)
Rescheduled per Joellen Jersey. Called and spoke with pt, confirmed new appt time for 9/21

## 2020-03-16 ENCOUNTER — Inpatient Hospital Stay: Payer: Medicare HMO | Attending: Hematology | Admitting: Hematology

## 2020-03-16 ENCOUNTER — Inpatient Hospital Stay: Payer: Medicare HMO | Admitting: Hematology

## 2020-03-16 DIAGNOSIS — L99 Other disorders of skin and subcutaneous tissue in diseases classified elsewhere: Secondary | ICD-10-CM

## 2020-03-16 DIAGNOSIS — E854 Organ-limited amyloidosis: Secondary | ICD-10-CM

## 2020-03-16 NOTE — Progress Notes (Signed)
HEMATOLOGY/ONCOLOGY CLINIC NOTE  Date of Service: 03/16/2020  Patient Care Team: [facility] as PCP - General (Family Medicine) Constance Haw, MD as PCP - Electrophysiology (Cardiology) Kristeen Miss, MD as Attending Physician (Neurosurgery) Roseanne Kaufman, MD as Attending Physician (Orthopedic Surgery) Laurence Spates, MD (Inactive) as Attending Physician (Gastroenterology) Sueanne Margarita, MD as Consulting Physician (Cardiology) Katy Apo, MD as Consulting Physician (Ophthalmology)  CHIEF COMPLAINTS/PURPOSE OF CONSULTATION:  Amyloidosis  HISTORY OF PRESENTING ILLNESS:   Craig Roberson is a wonderful 64 y.o. male who has been referred to [place] by Dr. Elvera Lennox for evaluation and management of amyloidosis. Pt is accompanied today by his wife. The pt reports that he is doing well overall.   The pt reports he had a blister along the left side of his lower lip. The blister appeared to be filled with fluid and lasted for 5 months. He went to see his Dermatologist, who drained the skin and sent the results to pathology. Pt has no other areas of skin that appear similar to the the blister. He has had multiple SCC and BCC and visits his Dermatologist every 6 months. His last skin cancer lesion was found around a year ago.   For a few months pt had localized right abdominal pain that was never explained. It disappeared with out any changes or intervention. He has not noticed any increase in the size of his tongue or vocal changes.   He has had a Cardiac ablation for his Atrial fibrillation and was on Flecainide for a time. He had an EKG earlier this month that was okay, but does not remember the last time he received an ECHO. Pt is currently taking Metformin for his Diabetes. Pt has arthritis and has never been diagnosed with RA. He has had a bilateral thumb fusion. He has Diverticulosis, but does not remember being diagnosed with Gastroparesis. Pt has  macular degeneration.   Of note prior to the patient's visit today, pt has had Left inferior medial angle of the mouth shave completed on 02/04/2020 with results revealing " Cutaneous Amyloidosis, Nodular Type".   On review of systems, pt denies abdominal pain, vocal changes, new skin lesions and any other symptoms.   On PMHx the pt reports Arthritis, BCC, SCC, Diabetes mellitus, Diverticulosis, Macular degeneration, PAF, Atrial Fibrillation Ablation, Thumb Fusion b/l. On Social Hx the pt reports that he used chewing tobacco for 50 years. Pt also used to be heavy drinker but quit in 2005. On Family Hx the pt reports that his father passed from Prostate cancer.   INTERVAL HISTORY: I connected with  Craig Roberson on 03/16/20 by telephone and verified that I am speaking with the correct person using two identifiers.   I discussed the limitations of evaluation and management by telemedicine. The patient expressed understanding and agreed to proceed.  Other persons participating in the visit and their role in the encounter:     -Yevette Edwards, Medical Scribe  Patient's location: Home Provider's location: [place] [facility] [place] is a wonderful 64 y.o. male who is here for evaluation and management of amyloidosis. The patient's last visit with [place] was on 02/18/2020. The pt reports that he is doing well overall.  The pt reports that he is well and has no new concerns or symptoms.   Of note since the patient's last visit, pt has had ECHO ([id number]) completed on 02/26/2020 with results revealing "1. Left ventricular ejection fraction, by  estimation, is 60 to 65%. The left ventricle has normal function. The left ventricle has no regional wall motion abnormalities. There is mild left ventricular hypertrophy. Left ventricular diastolic parameters are indeterminate. The average left ventricular global longitudinal strain is -18.6 %. The global longitudinal strain is normal. 2.  Right ventricular systolic function is normal. The right ventricular size is normal. 3. The mitral valve is normal in structure. No evidence of mitral valve regurgitation. No evidence of mitral stenosis. 4. The aortic valve is normal in structure. Aortic valve regurgitation is not visualized. No aortic stenosis is present. 5. The inferior vena cava is normal in size with greater than 50% respiratory variability, suggesting right atrial pressure of 3 mmHg."  Lab results (02/18/20) of CBC w/diff and CMP is as follows: all values are WNL except for Glucose at 104. 02/18/2020 Sed Rate at 9 02/18/2020 MMP showed all values are WNL  02/18/2020 24-Hr Ur UPEP shows all values are WNL 02/18/2020 K/L light chains is as follows: Kappa free light chain at 28.2, Lamda free light chains at 18.6, K/L light chain ratio at 1.52  On review of systems, pt denies any other symptoms.   MEDICAL HISTORY:  Past Medical History:  Diagnosis Date  . Allergy   . Arthritis    bilateral thumbs  . BCC (basal cell carcinoma of skin) 04/2006   LEFT low back  . Cardiac murmur 1996   normal ECHO  . Diabetes mellitus without complication ([place])   . Diverticulosis   . Dry eye 08/14/2017  . ED (erectile dysfunction) 2006  . Gastroparesis 08/14/2017  . H/O ETOH abuse    Quit 2006  . Hernia    RIGHT inguinal  . Hyperlipidemia   . Macular degeneration   . PAF (paroxysmal atrial fibrillation) ([place]) 04/15/2015   During exercise myoview  . Tinnitus of both ears   . Ulcer     SURGICAL HISTORY: Past Surgical History:  Procedure Laterality Date  . ATRIAL FIBRILLATION ABLATION N/A 09/28/2016   Procedure: Atrial Fibrillation Ablation;  Surgeon: Will Meredith Leeds, MD;  Location: [facility];  Service: Cardiovascular;  Laterality: N/A;  . GANGLION CYST EXCISION  1970's  . LUMBAR FUSION     L3-4; Dr. Ellene Route  . SPINE SURGERY    . THUMB FUSION     BILATERAL  . VASECTOMY  1976    SOCIAL HISTORY: Social History    Socioeconomic History  . Marital status: Married    Spouse name: Craig Roberson  . Number of children: 2  . Years of education: 8  . Highest education level: Not on file  Occupational History  . Occupation: Retired    Fish farm manager: RETIRED    Comment: [employer]  Tobacco Use  . Smoking status: Former Research scientist (life sciences)  . Smokeless tobacco: Former Systems developer    Types: Chew  . Tobacco comment: some days  Vaping Use  . Vaping Use: Never used  Substance and Sexual Activity  . Alcohol use: No    Alcohol/week: 0.0 standard drinks  . Drug use: No  . Sexual activity: Not Currently    Partners: Female    Comment: family stress; his back pain have reduced the frequency  Other Topics Concern  . Not on file  Social History Narrative   Married to 3rd wife.   Social Determinants of Health   Financial Resource Strain:   . Difficulty of Paying Living Expenses: Not on file  Food Insecurity:   . Worried About Charity fundraiser  in the Last Year: Not on file  . Ran Out of Food in the Last Year: Not on file  Transportation Needs:   . Lack of Transportation (Medical): Not on file  . Lack of Transportation (Non-Medical): Not on file  Physical Activity:   . Days of Exercise per Week: Not on file  . Minutes of Exercise per Session: Not on file  Stress:   . Feeling of Stress : Not on file  Social Connections:   . Frequency of Communication with Friends and Family: Not on file  . Frequency of Social Gatherings with Friends and Family: Not on file  . Attends Religious Services: Not on file  . Active Member of Clubs or Organizations: Not on file  . Attends Archivist Meetings: Not on file  . Marital Status: Not on file  Intimate Partner Violence:   . Fear of Current or Ex-Partner: Not on file  . Emotionally Abused: Not on file  . Physically Abused: Not on file  . Sexually Abused: Not on file    FAMILY HISTORY: Family History  Problem Relation Age of Onset  . Heart disease Brother 56        AMI, normal weight, normal cholesterol  . Heart disease Father 18  . Cancer Father        Prostate  . Stroke Father   . Heart attack Father   . Stroke Son 69  . Hyperlipidemia Mother   . Atrial fibrillation Brother   . Vascular Disease Brother        carotid artery disease  . Hypertension Brother     ALLERGIES:  is allergic to codeine, hydrocodone-acetaminophen, methocarbamol, morphine and related, and nsaids.  MEDICATIONS:  Current Outpatient Medications  Medication Sig Dispense Refill  . atorvastatin (LIPITOR) 80 MG tablet TAKE 1 TABLET (80 MG TOTAL) BY MOUTH DAILY. 90 tablet 3  . azelastine (ASTELIN) 0.1 % nasal spray Place 1 spray into both nostrils 2 (two) times daily as needed for rhinitis. Use in each nostril as directed    . cholecalciferol (VITAMIN D) 1000 UNITS tablet Take 1,000 Units by mouth daily.    . Continuous Blood Gluc Sensor ([place]) MISC See admin instructions.  3  . diazepam (VALIUM) 10 MG tablet Take 1 tablet (10 mg total) by mouth at bedtime as needed (tinnitus). 30 tablet 0  . ezetimibe (ZETIA) 10 MG tablet TAKE 1 TABLET (10 MG TOTAL) BY MOUTH DAILY. 90 tablet 3  . glucose monitoring kit (FREESTYLE) monitoring kit 1 each by Does not apply route as needed for other. 1 each 1  . lisinopril (ZESTRIL) 2.5 MG tablet Take 2.5 mg by mouth daily.    . metFORMIN (GLUCOPHAGE) 500 MG tablet Take 2 tablets (1,000 mg total) by mouth 2 (two) times daily with a meal. 360 tablet 3  . metoprolol tartrate (LOPRESSOR) 25 MG tablet TAKE 1 TABLET TWO TIMES DAILY. 180 tablet 3  . Omega-3 Fatty Acids (FISH OIL) 1200 MG CAPS Take 1,200 mg by mouth daily. Reported on 10/08/2015    . Saw Palmetto 450 MG CAPS Take 1 capsule by mouth daily.    Marland Kitchen SHINGRIX injection TO BE ADMINISTERED BY PHARMACIST FOR IMMUNIZATION  0  . sildenafil (REVATIO) 20 MG tablet TAKE 2-5 TABLETS BY MOUTH 1 HOUR BEFORE INTERCOURSE  11   No current facility-administered medications for  this visit.    REVIEW OF SYSTEMS:   A 10+ POINT REVIEW OF SYSTEMS WAS OBTAINED including neurology, dermatology,  psychiatry, cardiac, respiratory, lymph, extremities, GI, GU, Musculoskeletal, constitutional, breasts, reproductive, HEENT.  All pertinent positives are noted in the HPI.  All others are negative.   PHYSICAL EXAMINATION: ECOG PERFORMANCE STATUS: 0 - Asymptomatic  .Telehealth visit  LABORATORY DATA:  I have reviewed the data as listed  . CBC Latest Ref Rng & Units 02/18/2020 05/15/2017 09/15/2016  WBC 4.0 - 10.5 K/uL 6.2 7.3 8.4  Hemoglobin 13.0 - 17.0 g/dL 13.9 14.6 14.8  Hematocrit 39 - 52 % 40.7 42.4 43.0  Platelets 150 - 400 K/uL 365 321 338    . CMP Latest Ref Rng & Units 02/18/2020 08/14/2017 05/15/2017  Glucose 70 - 99 mg/dL 104(H) 110(H) 110(H)  BUN 8 - 23 mg/dL '14 11 9  ' Creatinine 0.61 - 1.24 mg/dL 0.88 1.02 0.99  Sodium 135 - 145 mmol/L 138 140 143  Potassium 3.5 - 5.1 mmol/L 4.1 4.7 5.0  Chloride 98 - 111 mmol/L 106 104 103  CO2 22 - 32 mmol/L 25 19(L) 27  Calcium 8.9 - 10.3 mg/dL 10.2 9.5 9.3  Total Protein 6.5 - 8.1 g/dL 7.4 7.4 6.8  Total Bilirubin 0.3 - 1.2 mg/dL 0.6 0.4 0.5  Alkaline Phos 38 - 126 U/L 71 90 97  AST 15 - 41 U/L '22 22 21  ' ALT 0 - 44 U/L '24 22 19     ' RADIOGRAPHIC STUDIES: I have personally reviewed the radiological images as listed and agreed with the findings in the report. ECHOCARDIOGRAM COMPLETE  Result Date: 02/26/2020    ECHOCARDIOGRAM REPORT   Patient Name:   ASHRITH SAGAN Date of Exam: 02/26/2020 Medical Rec #:  [id number]           Height:       72.0 in Accession #:    [id number]          Weight:       236.4 lb Date of Birth:  04-02-1956          BSA:          2.287 m Patient Age:    36 years            BP:           116/71 mmHg Patient Gender: M                   HR:           60 bpm. Exam Location:  Outpatient Procedure: 2D Echo, Color Doppler, Cardiac Doppler and Strain Analysis Indications:    I42.9 Cardiomyopathy  (unspecified), rule out cardiac Amyloidosis  History:        Patient has prior history of Echocardiogram examinations, most                 recent 02/25/2016. Arrythmias:Atrial Fibrillation; Risk                 Factors:Diabetes, Dyslipidemia, Cutaneous Amyloidosis and Sleep                 Apnea.  Sonographer:    Raquel Sarna Senior RDCS Referring Phys: [id number] [place]  1. Left ventricular ejection fraction, by estimation, is 60 to 65%. The left ventricle has normal function. The left ventricle has no regional wall motion abnormalities. There is mild left ventricular hypertrophy. Left ventricular diastolic parameters are indeterminate. The average left ventricular global longitudinal strain is -18.6 %. The global longitudinal strain is normal.  2. Right ventricular systolic function is normal. The right ventricular  size is normal.  3. The mitral valve is normal in structure. No evidence of mitral valve regurgitation. No evidence of mitral stenosis.  4. The aortic valve is normal in structure. Aortic valve regurgitation is not visualized. No aortic stenosis is present.  5. The inferior vena cava is normal in size with greater than 50% respiratory variability, suggesting right atrial pressure of 3 mmHg. FINDINGS  Left Ventricle: Left ventricular ejection fraction, by estimation, is 60 to 65%. The left ventricle has normal function. The left ventricle has no regional wall motion abnormalities. The average left ventricular global longitudinal strain is -18.6 %. The global longitudinal strain is normal. The left ventricular internal cavity size was normal in size. There is mild left ventricular hypertrophy. Left ventricular diastolic parameters are indeterminate. Right Ventricle: The right ventricular size is normal. No increase in right ventricular wall thickness. Right ventricular systolic function is normal. Left Atrium: Left atrial size was normal in size. Right Atrium: Right atrial size was normal in  size. Pericardium: There is no evidence of pericardial effusion. Mitral Valve: The mitral valve is normal in structure. Normal mobility of the mitral valve leaflets. No evidence of mitral valve regurgitation. No evidence of mitral valve stenosis. Tricuspid Valve: The tricuspid valve is normal in structure. Tricuspid valve regurgitation is trivial. No evidence of tricuspid stenosis. Aortic Valve: The aortic valve is normal in structure. Aortic valve regurgitation is not visualized. No aortic stenosis is present. Pulmonic Valve: The pulmonic valve was normal in structure. Pulmonic valve regurgitation is not visualized. No evidence of pulmonic stenosis. Aorta: The aortic root is normal in size and structure. Venous: The inferior vena cava is normal in size with greater than 50% respiratory variability, suggesting right atrial pressure of 3 mmHg. IAS/Shunts: No atrial level shunt detected by color flow Doppler.  LEFT VENTRICLE PLAX 2D LVIDd:         4.70 cm  Diastology LVIDs:         3.00 cm  LV e' lateral:   9.46 cm/s LV PW:         1.20 cm  LV E/e' lateral: 8.4 LV IVS:        1.20 cm  LV e' medial:    8.81 cm/s LVOT diam:     1.90 cm  LV E/e' medial:  9.0 LV SV:         65 LV SV Index:   29       2D Longitudinal Strain LVOT Area:     2.84 cm 2D Strain GLS Avg:     -18.6 %  RIGHT VENTRICLE RV S prime:     12.40 cm/s TAPSE (M-mode): 2.2 cm LEFT ATRIUM             Index       RIGHT ATRIUM           Index LA diam:        3.10 cm 1.36 cm/m  RA Area:     20.50 cm LA Vol (A2C):   72.2 ml 31.56 ml/m RA Volume:   57.60 ml  25.18 ml/m LA Vol (A4C):   64.1 ml 28.02 ml/m LA Biplane Vol: 72.1 ml 31.52 ml/m  AORTIC VALVE LVOT Vmax:   97.90 cm/s LVOT Vmean:  72.300 cm/s LVOT VTI:    0.231 m  AORTA Ao Root diam: 3.60 cm Ao Asc diam:  3.00 cm MITRAL VALVE MV Area (PHT): 2.73 cm    SHUNTS MV Decel Time: 278 msec  Systemic VTI:  0.23 m MV E velocity: 79.70 cm/s  Systemic Diam: 1.90 cm MV A velocity: 43.30 cm/s MV E/A ratio:   1.84 Candee Furbish MD Electronically signed by Candee Furbish MD Signature Date/Time: 02/26/2020/1:02:03 PM    Final     ASSESSMENT & PLAN:   64 yo with   1) Cutaneous Amyloidosis - likely primary cutaneous nodular type. R/o Secondary Amyloidosis of skini with systemic amyloidosis  PLAN: -Discussed pt labwork, 02/18/20; blood counts and chemistries are nml, no M Protein is observed, K/L light chains are nml, Sed Rate is nml, 24 hr urine shows no elevation in K/L light chains or any M Spike.  -Discussed 02/26/2020 ECHO ([id number]) revealed no abnormal protein deposit in the heart. -No evidence of systemic amyloidosis. Pt likely has localized cutaneous amyloid secondary to chronic inflammation.  -Recommend pt repeat labwork with PCP in 6 months.  -Will see back as needed   FOLLOW UP: RTC with Dr Irene Limbo as needed   The total time spent in the appt was 20 minutes and more than 50% was on counseling and direct patient cares.  All of the patient's questions were answered with apparent satisfaction. The patient knows to call the clinic with any problems, questions or concerns.    Sullivan Lone MD [place] AAHIVMS [facility] Hematology/Oncology Physician [facility]  (Office):       [phone] (Work cell):  [phone] (Fax):           [phone]  03/16/2020 4:37 PM  I, Yevette Edwards, am acting as a scribe for Dr. Sullivan Lone.   .I have reviewed the above documentation for accuracy and completeness, and I agree with the above. Brunetta Genera MD

## 2020-04-27 DIAGNOSIS — E782 Mixed hyperlipidemia: Secondary | ICD-10-CM | POA: Diagnosis not present

## 2020-04-27 DIAGNOSIS — I10 Essential (primary) hypertension: Secondary | ICD-10-CM | POA: Diagnosis not present

## 2020-04-27 DIAGNOSIS — I480 Paroxysmal atrial fibrillation: Secondary | ICD-10-CM | POA: Diagnosis not present

## 2020-04-27 DIAGNOSIS — E1169 Type 2 diabetes mellitus with other specified complication: Secondary | ICD-10-CM | POA: Diagnosis not present

## 2020-05-03 DIAGNOSIS — E119 Type 2 diabetes mellitus without complications: Secondary | ICD-10-CM | POA: Diagnosis not present

## 2020-05-03 DIAGNOSIS — H2513 Age-related nuclear cataract, bilateral: Secondary | ICD-10-CM | POA: Diagnosis not present

## 2020-05-03 DIAGNOSIS — H353111 Nonexudative age-related macular degeneration, right eye, early dry stage: Secondary | ICD-10-CM | POA: Diagnosis not present

## 2020-05-03 DIAGNOSIS — H524 Presbyopia: Secondary | ICD-10-CM | POA: Diagnosis not present

## 2020-05-07 DIAGNOSIS — Z1159 Encounter for screening for other viral diseases: Secondary | ICD-10-CM | POA: Diagnosis not present

## 2020-05-12 DIAGNOSIS — Z1211 Encounter for screening for malignant neoplasm of colon: Secondary | ICD-10-CM | POA: Diagnosis not present

## 2020-05-12 DIAGNOSIS — K648 Other hemorrhoids: Secondary | ICD-10-CM | POA: Diagnosis not present

## 2020-05-12 DIAGNOSIS — D128 Benign neoplasm of rectum: Secondary | ICD-10-CM | POA: Diagnosis not present

## 2020-05-12 DIAGNOSIS — K5730 Diverticulosis of large intestine without perforation or abscess without bleeding: Secondary | ICD-10-CM | POA: Diagnosis not present

## 2020-05-14 DIAGNOSIS — D128 Benign neoplasm of rectum: Secondary | ICD-10-CM | POA: Diagnosis not present

## 2020-07-26 DIAGNOSIS — I480 Paroxysmal atrial fibrillation: Secondary | ICD-10-CM | POA: Diagnosis not present

## 2020-07-26 DIAGNOSIS — E782 Mixed hyperlipidemia: Secondary | ICD-10-CM | POA: Diagnosis not present

## 2020-07-26 DIAGNOSIS — E1169 Type 2 diabetes mellitus with other specified complication: Secondary | ICD-10-CM | POA: Diagnosis not present

## 2020-07-26 DIAGNOSIS — I10 Essential (primary) hypertension: Secondary | ICD-10-CM | POA: Diagnosis not present

## 2020-08-10 DIAGNOSIS — L821 Other seborrheic keratosis: Secondary | ICD-10-CM | POA: Diagnosis not present

## 2020-08-10 DIAGNOSIS — D1801 Hemangioma of skin and subcutaneous tissue: Secondary | ICD-10-CM | POA: Diagnosis not present

## 2020-08-10 DIAGNOSIS — Z85828 Personal history of other malignant neoplasm of skin: Secondary | ICD-10-CM | POA: Diagnosis not present

## 2020-08-10 DIAGNOSIS — L720 Epidermal cyst: Secondary | ICD-10-CM | POA: Diagnosis not present

## 2020-08-10 DIAGNOSIS — D0461 Carcinoma in situ of skin of right upper limb, including shoulder: Secondary | ICD-10-CM | POA: Diagnosis not present

## 2020-08-10 DIAGNOSIS — L570 Actinic keratosis: Secondary | ICD-10-CM | POA: Diagnosis not present

## 2020-08-10 DIAGNOSIS — D225 Melanocytic nevi of trunk: Secondary | ICD-10-CM | POA: Diagnosis not present

## 2020-08-25 DIAGNOSIS — F5101 Primary insomnia: Secondary | ICD-10-CM | POA: Diagnosis not present

## 2020-08-25 DIAGNOSIS — E782 Mixed hyperlipidemia: Secondary | ICD-10-CM | POA: Diagnosis not present

## 2020-08-25 DIAGNOSIS — G4733 Obstructive sleep apnea (adult) (pediatric): Secondary | ICD-10-CM | POA: Diagnosis not present

## 2020-08-25 DIAGNOSIS — Z7984 Long term (current) use of oral hypoglycemic drugs: Secondary | ICD-10-CM | POA: Diagnosis not present

## 2020-08-25 DIAGNOSIS — E859 Amyloidosis, unspecified: Secondary | ICD-10-CM | POA: Diagnosis not present

## 2020-08-25 DIAGNOSIS — I10 Essential (primary) hypertension: Secondary | ICD-10-CM | POA: Diagnosis not present

## 2020-08-25 DIAGNOSIS — E1169 Type 2 diabetes mellitus with other specified complication: Secondary | ICD-10-CM | POA: Diagnosis not present

## 2020-08-25 DIAGNOSIS — N529 Male erectile dysfunction, unspecified: Secondary | ICD-10-CM | POA: Diagnosis not present

## 2020-08-27 DIAGNOSIS — I480 Paroxysmal atrial fibrillation: Secondary | ICD-10-CM | POA: Diagnosis not present

## 2020-08-27 DIAGNOSIS — E119 Type 2 diabetes mellitus without complications: Secondary | ICD-10-CM | POA: Diagnosis not present

## 2020-08-27 DIAGNOSIS — I10 Essential (primary) hypertension: Secondary | ICD-10-CM | POA: Diagnosis not present

## 2020-08-27 DIAGNOSIS — E782 Mixed hyperlipidemia: Secondary | ICD-10-CM | POA: Diagnosis not present

## 2020-08-27 DIAGNOSIS — E1169 Type 2 diabetes mellitus with other specified complication: Secondary | ICD-10-CM | POA: Diagnosis not present

## 2020-09-01 DIAGNOSIS — B354 Tinea corporis: Secondary | ICD-10-CM | POA: Diagnosis not present

## 2020-09-01 DIAGNOSIS — R21 Rash and other nonspecific skin eruption: Secondary | ICD-10-CM | POA: Diagnosis not present

## 2020-09-29 DIAGNOSIS — I480 Paroxysmal atrial fibrillation: Secondary | ICD-10-CM | POA: Diagnosis not present

## 2020-09-29 DIAGNOSIS — E782 Mixed hyperlipidemia: Secondary | ICD-10-CM | POA: Diagnosis not present

## 2020-09-29 DIAGNOSIS — E119 Type 2 diabetes mellitus without complications: Secondary | ICD-10-CM | POA: Diagnosis not present

## 2020-09-29 DIAGNOSIS — I10 Essential (primary) hypertension: Secondary | ICD-10-CM | POA: Diagnosis not present

## 2020-09-29 DIAGNOSIS — E1169 Type 2 diabetes mellitus with other specified complication: Secondary | ICD-10-CM | POA: Diagnosis not present

## 2020-10-28 DIAGNOSIS — I10 Essential (primary) hypertension: Secondary | ICD-10-CM | POA: Diagnosis not present

## 2020-10-28 DIAGNOSIS — E1169 Type 2 diabetes mellitus with other specified complication: Secondary | ICD-10-CM | POA: Diagnosis not present

## 2020-10-28 DIAGNOSIS — E119 Type 2 diabetes mellitus without complications: Secondary | ICD-10-CM | POA: Diagnosis not present

## 2020-10-28 DIAGNOSIS — E782 Mixed hyperlipidemia: Secondary | ICD-10-CM | POA: Diagnosis not present

## 2020-10-28 DIAGNOSIS — I480 Paroxysmal atrial fibrillation: Secondary | ICD-10-CM | POA: Diagnosis not present

## 2021-01-14 ENCOUNTER — Other Ambulatory Visit: Payer: Self-pay | Admitting: Cardiology

## 2021-01-24 NOTE — Progress Notes (Signed)
PCP:  [facility] Primary Cardiologist: None Electrophysiologist: Will Meredith Leeds, MD   Craig Roberson is a 65 y.o. male seen today for Will Meredith Leeds, MD for routine electrophysiology followup.  Since last being seen in our clinic the patient reports doing very well. He has brief intermittent palpitations a couple a times a week after caffeine intake. None sustained. Seconds at most. he denies chest pain, dyspnea, PND, orthopnea, nausea, vomiting, dizziness, syncope, edema, weight gain, or early satiety.  Past Medical History:  Diagnosis Date   Allergy    Arthritis    bilateral thumbs   BCC (basal cell carcinoma of skin) 04/2006   LEFT low back   Cardiac murmur 1996   normal ECHO   Diabetes mellitus without complication ([place])    Diverticulosis    Dry eye 08/14/2017   ED (erectile dysfunction) 2006   Gastroparesis 08/14/2017   H/O ETOH abuse    Quit 2006   Hernia    RIGHT inguinal   Hyperlipidemia    Macular degeneration    PAF (paroxysmal atrial fibrillation) ([place]) 04/15/2015   During exercise myoview   Tinnitus of both ears    Ulcer    Past Surgical History:  Procedure Laterality Date   ATRIAL FIBRILLATION ABLATION N/A 09/28/2016   Procedure: Atrial Fibrillation Ablation;  Surgeon: Will Meredith Leeds, MD;  Location: [facility];  Service: Cardiovascular;  Laterality: N/A;   GANGLION CYST EXCISION  1970's   LUMBAR FUSION     L3-4; Dr. Ellene Route   SPINE SURGERY     THUMB FUSION     BILATERAL   VASECTOMY  1976    Current Outpatient Medications  Medication Sig Dispense Refill   atorvastatin (LIPITOR) 80 MG tablet TAKE 1 TABLET (80 MG TOTAL) BY MOUTH DAILY. 90 tablet 3   azelastine (ASTELIN) 0.1 % nasal spray Place 1 spray into both nostrils 2 (two) times daily as needed for rhinitis. Use in each nostril as directed     cholecalciferol (VITAMIN D) 1000 UNITS tablet Take 1,000 Units by mouth daily.     Continuous Blood  Gluc Sensor ([place]) MISC See admin instructions.  3   diazepam (VALIUM) 10 MG tablet Take 1 tablet (10 mg total) by mouth at bedtime as needed (tinnitus). 30 tablet 0   ezetimibe (ZETIA) 10 MG tablet TAKE 1 TABLET (10 MG TOTAL) BY MOUTH DAILY. 90 tablet 3   glucose monitoring kit (FREESTYLE) monitoring kit 1 each by Does not apply route as needed for other. 1 each 1   lisinopril (ZESTRIL) 2.5 MG tablet Take 2.5 mg by mouth daily.     metFORMIN (GLUCOPHAGE) 500 MG tablet Take 2 tablets (1,000 mg total) by mouth 2 (two) times daily with a meal. 360 tablet 3   metoprolol tartrate (LOPRESSOR) 25 MG tablet TAKE 1 TABLET TWICE DAILY 180 tablet 3   Omega-3 Fatty Acids (FISH OIL) 1200 MG CAPS Take 1,200 mg by mouth daily. Reported on 10/08/2015     Saw Palmetto 450 MG CAPS Take 1 capsule by mouth daily.     SHINGRIX injection TO BE ADMINISTERED BY PHARMACIST FOR IMMUNIZATION  0   sildenafil (REVATIO) 20 MG tablet TAKE 2-5 TABLETS BY MOUTH 1 HOUR BEFORE INTERCOURSE  11   No current facility-administered medications for this visit.    Allergies  Allergen Reactions   Codeine Other (See Comments)    Dizziness; faint   Hydrocodone-Acetaminophen  Unknown per pt    Methocarbamol Other (See Comments)    HA, tinnitus   Morphine And Related Other (See Comments)    Severe Headache   Nsaids     BRBPR    Social History   Socioeconomic History   Marital status: Married    Spouse name: [name]   Number of children: 2   Years of education: 8   Highest education level: Not on file  Occupational History   Occupation: Retired    Fish farm manager: RETIRED    Comment: [employer]  Tobacco Use   Smoking status: Former   Smokeless tobacco: Former    Types: Chew   Tobacco comments:    some days  Vaping Use   Vaping Use: Never used  Substance and Sexual Activity   Alcohol use: No    Alcohol/week: 0.0 standard drinks   Drug use: No   Sexual activity: Not Currently     Partners: Female    Comment: family stress; his back pain have reduced the frequency  Other Topics Concern   Not on file  Social History Narrative   Married to 3rd wife.   Social Determinants of Health   Financial Resource Strain: Not on file  Food Insecurity: Not on file  Transportation Needs: Not on file  Physical Activity: Not on file  Stress: Not on file  Social Connections: Not on file  Intimate Partner Violence: Not on file     Review of Systems: General: No chills, fever, night sweats or weight changes  Cardiovascular:  No chest pain, dyspnea on exertion, edema, orthopnea, palpitations, paroxysmal nocturnal dyspnea Dermatological: No rash, lesions or masses Respiratory: No cough, dyspnea Urologic: No hematuria, dysuria Abdominal: No nausea, vomiting, diarrhea, bright red blood per rectum, melena, or hematemesis Neurologic: No visual changes, weakness, changes in mental status All other systems reviewed and are otherwise negative except as noted above.  Physical Exam: Vitals:   01/25/21 0758  BP: 128/68  Pulse: (!) 59  SpO2: 96%  Weight: 236 lb (107 kg)  Height: 5' 9.5" (1.765 m)    GEN- The patient is well appearing, alert and oriented x 3 today.   HEENT: normocephalic, atraumatic; sclera clear, conjunctiva pink; hearing intact; oropharynx clear; neck supple, no JVP Lymph- no cervical lymphadenopathy Lungs- Clear to ausculation bilaterally, normal work of breathing.  No wheezes, rales, rhonchi Heart- Regular rate and rhythm, no murmurs, rubs or gallops, PMI not laterally displaced GI- soft, non-tender, non-distended, bowel sounds present, no hepatosplenomegaly Extremities- no clubbing, cyanosis, or edema; DP/PT/radial pulses 2+ bilaterally MS- no significant deformity or atrophy Skin- warm and dry, no rash or lesion Psych- euthymic mood, full affect Neuro- strength and sensation are intact  EKG is ordered. Personal review of EKG from today shows Sinus  bradycardia at 59 bpm, QRS 90 ms, PR interval 176 ms.  Additional studies reviewed include: Previous EP office notes  Assessment and Plan:  1.  Proximal atrial fibrillation/atrial flutter: Status post ablation 09/28/2016.   EKG today shows NSR Previously taken off of flecainide with no breakthrough. Understands to call back if feels like having more.  CHA2DS2-VASc of at least 3. He has not felt any sustained palpitations.  Suspect his brief caffeine associated palpitations are more likely PVCs/PACs based on patients description of tele during colonoscopy. (Quick beat with a short pause after).  Discussed potential use of loop recorder to follow any burden and the need for resumption of [place] as he gets older. He will let us know  if palpitations increase or he has any symptoms reminiscent of when he had AF.     2. Hyperlipidemia: Continue atorvastatin   3. OSA: Encouraged nightly CPAP.   Labs today. RTC 6 months.   Shirley Friar, PA-C  01/25/21 8:02 AM

## 2021-01-25 ENCOUNTER — Ambulatory Visit: Payer: Medicare HMO | Admitting: Student

## 2021-01-25 ENCOUNTER — Other Ambulatory Visit: Payer: Self-pay

## 2021-01-25 ENCOUNTER — Encounter: Payer: Self-pay | Admitting: Student

## 2021-01-25 VITALS — BP 128/68 | HR 59 | Ht 69.5 in | Wt 236.0 lb

## 2021-01-25 DIAGNOSIS — G4733 Obstructive sleep apnea (adult) (pediatric): Secondary | ICD-10-CM | POA: Diagnosis not present

## 2021-01-25 DIAGNOSIS — I480 Paroxysmal atrial fibrillation: Secondary | ICD-10-CM

## 2021-01-25 DIAGNOSIS — E782 Mixed hyperlipidemia: Secondary | ICD-10-CM | POA: Diagnosis not present

## 2021-01-25 LAB — BASIC METABOLIC PANEL
BUN/Creatinine Ratio: 13 (ref 10–24)
BUN: 12 mg/dL (ref 8–27)
CO2: 20 mmol/L (ref 20–29)
Calcium: 9.4 mg/dL (ref 8.6–10.2)
Chloride: 102 mmol/L (ref 96–106)
Creatinine, Ser: 0.94 mg/dL (ref 0.76–1.27)
Glucose: 106 mg/dL — ABNORMAL HIGH (ref 65–99)
Potassium: 4.4 mmol/L (ref 3.5–5.2)
Sodium: 141 mmol/L (ref 134–144)
eGFR: 89 mL/min/{1.73_m2} (ref >59)

## 2021-01-25 NOTE — Patient Instructions (Signed)
Medication Instructions:  Your physician recommends that you continue on your current medications as directed. Please refer to the Current Medication list given to you today.  *If you need a refill on your cardiac medications before your next appointment, please call your pharmacy*   Lab Work: TODAY: BMET  If you have labs (blood work) drawn today and your tests are completely normal, you will receive your results only by: [place] (if you have MyChart) OR A paper copy in the mail If you have any lab test that is abnormal or we need to change your treatment, we will call you to review the results.  Follow-Up: At [facility], you and your health needs are our priority.  As part of our continuing mission to provide you with exceptional heart care, we have created designated Provider Care Teams.  These Care Teams include your primary Cardiologist (physician) and Advanced Practice Providers (APPs -  Physician Assistants and Nurse Practitioners) who all work together to provide you with the care you need, when you need it.    Your next appointment:   6 month(s)  The format for your next appointment:   In Person  Provider:   You may see Will Meredith Leeds, MD or one of the following Advanced Practice Providers on your designated Care Team:   Tommye Standard, [place] Legrand Como "[facility]" [place], [place]

## 2021-02-07 DIAGNOSIS — L814 Other melanin hyperpigmentation: Secondary | ICD-10-CM | POA: Diagnosis not present

## 2021-02-07 DIAGNOSIS — C44311 Basal cell carcinoma of skin of nose: Secondary | ICD-10-CM | POA: Diagnosis not present

## 2021-02-07 DIAGNOSIS — L821 Other seborrheic keratosis: Secondary | ICD-10-CM | POA: Diagnosis not present

## 2021-02-07 DIAGNOSIS — L570 Actinic keratosis: Secondary | ICD-10-CM | POA: Diagnosis not present

## 2021-02-07 DIAGNOSIS — C44319 Basal cell carcinoma of skin of other parts of face: Secondary | ICD-10-CM | POA: Diagnosis not present

## 2021-02-07 DIAGNOSIS — Z85828 Personal history of other malignant neoplasm of skin: Secondary | ICD-10-CM | POA: Diagnosis not present

## 2021-02-16 DIAGNOSIS — Z6833 Body mass index (BMI) 33.0-33.9, adult: Secondary | ICD-10-CM | POA: Diagnosis not present

## 2021-02-16 DIAGNOSIS — Z0000 Encounter for general adult medical examination without abnormal findings: Secondary | ICD-10-CM | POA: Diagnosis not present

## 2021-02-16 DIAGNOSIS — I480 Paroxysmal atrial fibrillation: Secondary | ICD-10-CM | POA: Diagnosis not present

## 2021-02-16 DIAGNOSIS — F5101 Primary insomnia: Secondary | ICD-10-CM | POA: Diagnosis not present

## 2021-02-16 DIAGNOSIS — Z125 Encounter for screening for malignant neoplasm of prostate: Secondary | ICD-10-CM | POA: Diagnosis not present

## 2021-02-16 DIAGNOSIS — E1169 Type 2 diabetes mellitus with other specified complication: Secondary | ICD-10-CM | POA: Diagnosis not present

## 2021-02-16 DIAGNOSIS — I10 Essential (primary) hypertension: Secondary | ICD-10-CM | POA: Diagnosis not present

## 2021-02-16 DIAGNOSIS — E854 Organ-limited amyloidosis: Secondary | ICD-10-CM | POA: Diagnosis not present

## 2021-02-16 DIAGNOSIS — N529 Male erectile dysfunction, unspecified: Secondary | ICD-10-CM | POA: Diagnosis not present

## 2021-02-16 DIAGNOSIS — E782 Mixed hyperlipidemia: Secondary | ICD-10-CM | POA: Diagnosis not present

## 2021-03-09 DIAGNOSIS — I10 Essential (primary) hypertension: Secondary | ICD-10-CM | POA: Diagnosis not present

## 2021-03-09 DIAGNOSIS — E782 Mixed hyperlipidemia: Secondary | ICD-10-CM | POA: Diagnosis not present

## 2021-03-09 DIAGNOSIS — I480 Paroxysmal atrial fibrillation: Secondary | ICD-10-CM | POA: Diagnosis not present

## 2021-03-09 DIAGNOSIS — E1169 Type 2 diabetes mellitus with other specified complication: Secondary | ICD-10-CM | POA: Diagnosis not present

## 2021-06-24 DIAGNOSIS — E1169 Type 2 diabetes mellitus with other specified complication: Secondary | ICD-10-CM | POA: Diagnosis not present

## 2021-06-24 DIAGNOSIS — I480 Paroxysmal atrial fibrillation: Secondary | ICD-10-CM | POA: Diagnosis not present

## 2021-06-24 DIAGNOSIS — I10 Essential (primary) hypertension: Secondary | ICD-10-CM | POA: Diagnosis not present

## 2021-06-24 DIAGNOSIS — E782 Mixed hyperlipidemia: Secondary | ICD-10-CM | POA: Diagnosis not present

## 2021-08-08 DIAGNOSIS — H353131 Nonexudative age-related macular degeneration, bilateral, early dry stage: Secondary | ICD-10-CM | POA: Diagnosis not present

## 2021-08-08 DIAGNOSIS — H2513 Age-related nuclear cataract, bilateral: Secondary | ICD-10-CM | POA: Diagnosis not present

## 2021-08-08 DIAGNOSIS — H52203 Unspecified astigmatism, bilateral: Secondary | ICD-10-CM | POA: Diagnosis not present

## 2021-08-08 DIAGNOSIS — E119 Type 2 diabetes mellitus without complications: Secondary | ICD-10-CM | POA: Diagnosis not present

## 2021-08-10 DIAGNOSIS — I499 Cardiac arrhythmia, unspecified: Secondary | ICD-10-CM | POA: Diagnosis not present

## 2021-08-19 DIAGNOSIS — D2371 Other benign neoplasm of skin of right lower limb, including hip: Secondary | ICD-10-CM | POA: Diagnosis not present

## 2021-08-19 DIAGNOSIS — L821 Other seborrheic keratosis: Secondary | ICD-10-CM | POA: Diagnosis not present

## 2021-08-19 DIAGNOSIS — Z85828 Personal history of other malignant neoplasm of skin: Secondary | ICD-10-CM | POA: Diagnosis not present

## 2021-08-19 DIAGNOSIS — E8589 Other amyloidosis: Secondary | ICD-10-CM | POA: Diagnosis not present

## 2021-08-19 DIAGNOSIS — L820 Inflamed seborrheic keratosis: Secondary | ICD-10-CM | POA: Diagnosis not present

## 2021-08-19 DIAGNOSIS — D1801 Hemangioma of skin and subcutaneous tissue: Secondary | ICD-10-CM | POA: Diagnosis not present

## 2021-08-19 DIAGNOSIS — L814 Other melanin hyperpigmentation: Secondary | ICD-10-CM | POA: Diagnosis not present

## 2021-08-19 DIAGNOSIS — D225 Melanocytic nevi of trunk: Secondary | ICD-10-CM | POA: Diagnosis not present

## 2021-08-19 DIAGNOSIS — L570 Actinic keratosis: Secondary | ICD-10-CM | POA: Diagnosis not present

## 2021-08-23 ENCOUNTER — Encounter: Payer: Self-pay | Admitting: Cardiology

## 2021-08-23 ENCOUNTER — Ambulatory Visit: Payer: Medicare HMO | Admitting: Cardiology

## 2021-08-23 ENCOUNTER — Other Ambulatory Visit: Payer: Self-pay

## 2021-08-23 VITALS — BP 132/72 | HR 74 | Ht 69.5 in | Wt 227.4 lb

## 2021-08-23 DIAGNOSIS — I480 Paroxysmal atrial fibrillation: Secondary | ICD-10-CM

## 2021-08-23 NOTE — Patient Instructions (Signed)
Medication Instructions:  Your physician recommends that you continue on your current medications as directed. Please refer to the Current Medication list given to you today.  *If you need a refill on your cardiac medications before your next appointment, please call your pharmacy*   Lab Work: None ordered   Testing/Procedures: None ordered   Follow-Up: At [facility], you and your health needs are our priority.  As part of our continuing mission to provide you with exceptional heart care, we have created designated Provider Care Teams.  These Care Teams include your primary Cardiologist (physician) and Advanced Practice Providers (APPs -  Physician Assistants and Nurse Practitioners) who all work together to provide you with the care you need, when you need it.  Your next appointment:   6 month(s)  The format for your next appointment:   In Person  Provider:   You will see one of the following Advanced Practice Providers on your designated Care Team:   Renee Ursuy, PA-C Michael "Andy" Tillery, PA-C   Thank you for choosing [facility]!!   Emaan Gary, RN [phone]         

## 2021-08-23 NOTE — Progress Notes (Signed)
Electrophysiology Office Note   Date:  08/23/2021   ID:  Craig Roberson, DOB Jul 24, 1955, MRN [id number]  PCP:  [facility] [place]  Cardiologist:  [name] Primary Electrophysiologist:  Chealsey Miyamoto Meredith Leeds, MD    No chief complaint on file.    History of Present Illness: Craig Roberson is a 66 y.o. male who presents today for electrophysiology evaluation.     He has a history significant for paroxysmal atrial fibrillation/flutter, OSA, hyperlipidemia, diabetes.  He is status post atrial fibrillation ablation 09/28/2016.  He was initially on flecainide, but this has since been stopped.  Today, denies symptoms of palpitations, chest pain, shortness of breath, orthopnea, PND, lower extremity edema, claudication, dizziness, presyncope, syncope, bleeding, or neurologic sequela. The patient is tolerating medications without difficulties.  Not feel that he has had any more episodes of atrial fibrillation.  He is overall happy with his control.  If you weeks ago, he did have palpitations that felt like extra beats.  He has not had this since that time.  He has adjusted his caffeine intake, and when he decrease his caffeine he felt like he was having extra beats.  Past Medical History:  Diagnosis Date   Allergy    Arthritis    bilateral thumbs   BCC (basal cell carcinoma of skin) 04/2006   LEFT low back   Cardiac murmur 1996   normal ECHO   Diabetes mellitus without complication ([place])    Diverticulosis    Dry eye 08/14/2017   ED (erectile dysfunction) 2006   Gastroparesis 08/14/2017   H/O ETOH abuse    Quit 2006   Hernia    RIGHT inguinal   Hyperlipidemia    Macular degeneration    PAF (paroxysmal atrial fibrillation) ([place]) 04/15/2015   During exercise myoview   Tinnitus of both ears    Ulcer    Past Surgical History:  Procedure Laterality Date   ATRIAL FIBRILLATION ABLATION N/A 09/28/2016   Procedure: Atrial Fibrillation Ablation;  Surgeon: Tearra Ouk  Meredith Leeds, MD;  Location: [place] [facility];  Service: Cardiovascular;  Laterality: N/A;   GANGLION CYST EXCISION  1970's   LUMBAR FUSION     L3-4; Dr. Ellene Route   SPINE SURGERY     THUMB FUSION     BILATERAL   VASECTOMY  1976     Current Outpatient Medications  Medication Sig Dispense Refill   atorvastatin (LIPITOR) 80 MG tablet TAKE 1 TABLET (80 MG TOTAL) BY MOUTH DAILY. 90 tablet 3   azelastine (ASTELIN) 0.1 % nasal spray Place 1 spray into both nostrils 2 (two) times daily as needed for rhinitis. Use in each nostril as directed     cholecalciferol (VITAMIN D) 1000 UNITS tablet Take 1,000 Units by mouth daily.     Continuous Blood Gluc Sensor ([place]) MISC See admin instructions.  3   ezetimibe (ZETIA) 10 MG tablet TAKE 1 TABLET (10 MG TOTAL) BY MOUTH DAILY. 90 tablet 3   glucose monitoring kit (FREESTYLE) monitoring kit 1 each by Does not apply route as needed for other. 1 each 1   lisinopril (ZESTRIL) 2.5 MG tablet Take 2.5 mg by mouth daily.     metFORMIN (GLUCOPHAGE) 500 MG tablet Take 2 tablets (1,000 mg total) by mouth 2 (two) times daily with a meal. 360 tablet 3   metoprolol tartrate (LOPRESSOR) 25 MG tablet TAKE 1 TABLET TWICE DAILY 180 tablet 3   Omega-3 Fatty Acids (FISH OIL) 1200 MG  CAPS Take 1,200 mg by mouth daily. Reported on 10/08/2015     Saw Palmetto 450 MG CAPS Take 1 capsule by mouth daily.     SHINGRIX injection TO BE ADMINISTERED BY PHARMACIST FOR IMMUNIZATION  0   sildenafil (REVATIO) 20 MG tablet TAKE 2-5 TABLETS BY MOUTH 1 HOUR BEFORE INTERCOURSE  11   No current facility-administered medications for this visit.    Allergies:   Codeine, Hydrocodone-acetaminophen, Methocarbamol, Morphine and related, and Nsaids   Social History:  The patient  reports that he has quit smoking. He has never been exposed to tobacco smoke. He has quit using smokeless tobacco.  His smokeless tobacco use included chew. He reports that he does not drink  alcohol and does not use drugs.   Family History:  The patient's family history includes Atrial fibrillation in his brother; Cancer in his father; Heart attack in his father; Heart disease (age of onset: 69) in his father; Heart disease (age of onset: 50) in his brother; Hyperlipidemia in his mother; Hypertension in his brother; Stroke in his father; Stroke (age of onset: 70) in his son; Vascular Disease in his brother.   ROS:  Please see the history of present illness.   Otherwise, review of systems is positive for none.   All other systems are reviewed and negative.   PHYSICAL EXAM: VS:  BP 132/72    Pulse 74    Ht 5' 9.5" (1.765 m)    Wt 227 lb 6.4 oz (103.1 kg)    SpO2 95%    BMI 33.10 kg/m  , BMI Body mass index is 33.1 kg/m. GEN: Well nourished, well developed, in no acute distress  HEENT: normal  Neck: no JVD, carotid bruits, or masses Cardiac: RRR; no murmurs, rubs, or gallops,no edema  Respiratory:  clear to auscultation bilaterally, normal work of breathing GI: soft, nontender, nondistended, + BS MS: no deformity or atrophy  Skin: warm and dry Neuro:  Strength and sensation are intact Psych: euthymic mood, full affect  EKG:  EKG is not ordered today. Personal review of the ekg ordered 08/10/21 shows sinus rhythm, rate 80  Recent Labs: 01/25/2021: BUN 12; Creatinine, Ser 0.94; Potassium 4.4; Sodium 141    Lipid Panel     Component Value Date/Time   CHOL 136 08/14/2017 0843   TRIG 68 08/14/2017 0843   HDL 41 08/14/2017 0843   CHOLHDL 3.3 08/14/2017 0843   CHOLHDL 4.1 02/22/2016 1237   VLDL 25 02/22/2016 1237   LDLCALC 81 08/14/2017 0843     Wt Readings from Last 3 Encounters:  08/23/21 227 lb 6.4 oz (103.1 kg)  01/25/21 236 lb (107 kg)  02/18/20 236 lb 6.4 oz (107.2 kg)      Other studies Reviewed: Additional studies/ records that were reviewed today include: TTE 04/22/15  Review of the above records today demonstrates:  - Left ventricle: The cavity size was  normal. There was mild focal   basal hypertrophy of the septum. Systolic function was normal.   The estimated ejection fraction was in the range of 55% to 60%.   Wall motion was normal; there were no regional wall motion   abnormalities. Left ventricular diastolic function parameters   were normal. - Right ventricle: The cavity size was normal. Wall thickness was   normal. Systolic function was normal. - Atrial septum: No defect or patent foramen ovale was identified. - Tricuspid valve: There was no regurgitation. - Pulmonic valve: There was trivial regurgitation. - Inferior vena cava:  The vessel was normal in size. The   respirophasic diameter changes were in the normal range (>= 50%),   consistent with normal central venous pressure.   ASSESSMENT AND PLAN:  1.  Paroxysmal atrial fibrillation/flutter: Status post ablation 09/28/2016.  CHA2DS2-VASc of 2.  Not anticoagulated as he is status post ablation in 2018.  Fortunately he has had no further episodes of atrial fibrillation.  He did have palpitations.  He Cledis Sohn get a cardia mobile to further monitor this.  2.  Hyperlipidemia: Continue atorvastatin  3.  Obstructive sleep apnea: CPAP compliance encouraged   Current medicines are reviewed at length with the patient today.   The patient does not have concerns regarding his medicines.  The following changes were made today: None  Labs/ tests ordered today include:  No orders of the defined types were placed in this encounter.    Disposition:   FU with Chantal Worthey 6 months  Signed, Dayanira Giovannetti Meredith Leeds, MD  08/23/2021 10:02 AM     [facility] [place] [facility] [address] [place] [address] [phone] (office) [phone] (fax)

## 2021-08-29 DIAGNOSIS — R21 Rash and other nonspecific skin eruption: Secondary | ICD-10-CM | POA: Diagnosis not present

## 2021-12-08 ENCOUNTER — Ambulatory Visit: Payer: Medicare HMO | Admitting: Podiatry

## 2021-12-20 ENCOUNTER — Encounter: Payer: Self-pay | Admitting: Podiatry

## 2021-12-20 ENCOUNTER — Ambulatory Visit: Payer: Medicare HMO | Admitting: Podiatry

## 2021-12-20 ENCOUNTER — Ambulatory Visit (INDEPENDENT_AMBULATORY_CARE_PROVIDER_SITE_OTHER): Payer: Medicare HMO

## 2021-12-20 DIAGNOSIS — I499 Cardiac arrhythmia, unspecified: Secondary | ICD-10-CM | POA: Insufficient documentation

## 2021-12-20 DIAGNOSIS — M722 Plantar fascial fibromatosis: Secondary | ICD-10-CM | POA: Diagnosis not present

## 2021-12-20 DIAGNOSIS — M7662 Achilles tendinitis, left leg: Secondary | ICD-10-CM | POA: Diagnosis not present

## 2021-12-20 DIAGNOSIS — C4490 Unspecified malignant neoplasm of skin, unspecified: Secondary | ICD-10-CM | POA: Insufficient documentation

## 2021-12-20 DIAGNOSIS — I519 Heart disease, unspecified: Secondary | ICD-10-CM | POA: Insufficient documentation

## 2021-12-20 DIAGNOSIS — Z1211 Encounter for screening for malignant neoplasm of colon: Secondary | ICD-10-CM | POA: Insufficient documentation

## 2021-12-20 DIAGNOSIS — I10 Essential (primary) hypertension: Secondary | ICD-10-CM | POA: Insufficient documentation

## 2021-12-20 DIAGNOSIS — E859 Amyloidosis, unspecified: Secondary | ICD-10-CM | POA: Insufficient documentation

## 2021-12-20 DIAGNOSIS — F5101 Primary insomnia: Secondary | ICD-10-CM | POA: Insufficient documentation

## 2021-12-20 MED ORDER — MELOXICAM 15 MG PO TABS: 15.0000 mg | ORAL_TABLET | Freq: Every day | ORAL | 3 refills | Status: DC

## 2021-12-20 MED ORDER — DEXAMETHASONE SODIUM PHOSPHATE 120 MG/30ML IJ SOLN
2.0000 mg | Freq: Once | INTRAMUSCULAR | Status: AC
  Administered 2021-12-20: 2 mg via INTRA_ARTICULAR

## 2021-12-20 MED ORDER — METHYLPREDNISOLONE 4 MG PO TBPK: ORAL_TABLET | ORAL | 0 refills | Status: DC

## 2022-01-11 ENCOUNTER — Other Ambulatory Visit: Payer: Self-pay | Admitting: Cardiology

## 2022-02-04 DIAGNOSIS — K5732 Diverticulitis of large intestine without perforation or abscess without bleeding: Secondary | ICD-10-CM | POA: Diagnosis not present

## 2022-03-01 DIAGNOSIS — E782 Mixed hyperlipidemia: Secondary | ICD-10-CM | POA: Diagnosis not present

## 2022-03-01 DIAGNOSIS — I480 Paroxysmal atrial fibrillation: Secondary | ICD-10-CM | POA: Diagnosis not present

## 2022-03-01 DIAGNOSIS — Z0000 Encounter for general adult medical examination without abnormal findings: Secondary | ICD-10-CM | POA: Diagnosis not present

## 2022-03-01 DIAGNOSIS — E1169 Type 2 diabetes mellitus with other specified complication: Secondary | ICD-10-CM | POA: Diagnosis not present

## 2022-03-01 DIAGNOSIS — I10 Essential (primary) hypertension: Secondary | ICD-10-CM | POA: Diagnosis not present

## 2022-03-01 DIAGNOSIS — Z23 Encounter for immunization: Secondary | ICD-10-CM | POA: Diagnosis not present

## 2022-03-01 DIAGNOSIS — Z125 Encounter for screening for malignant neoplasm of prostate: Secondary | ICD-10-CM | POA: Diagnosis not present

## 2022-03-01 DIAGNOSIS — N529 Male erectile dysfunction, unspecified: Secondary | ICD-10-CM | POA: Diagnosis not present

## 2022-03-07 DIAGNOSIS — M545 Low back pain, unspecified: Secondary | ICD-10-CM | POA: Diagnosis not present

## 2022-03-08 DIAGNOSIS — I10 Essential (primary) hypertension: Secondary | ICD-10-CM | POA: Diagnosis not present

## 2022-03-08 DIAGNOSIS — E782 Mixed hyperlipidemia: Secondary | ICD-10-CM | POA: Diagnosis not present

## 2022-03-08 DIAGNOSIS — E1169 Type 2 diabetes mellitus with other specified complication: Secondary | ICD-10-CM | POA: Diagnosis not present

## 2022-04-13 ENCOUNTER — Other Ambulatory Visit: Payer: Self-pay | Admitting: Podiatry

## 2022-08-10 DIAGNOSIS — H353132 Nonexudative age-related macular degeneration, bilateral, intermediate dry stage: Secondary | ICD-10-CM | POA: Diagnosis not present

## 2022-08-10 DIAGNOSIS — E119 Type 2 diabetes mellitus without complications: Secondary | ICD-10-CM | POA: Diagnosis not present

## 2022-08-10 DIAGNOSIS — H52203 Unspecified astigmatism, bilateral: Secondary | ICD-10-CM | POA: Diagnosis not present

## 2022-08-10 DIAGNOSIS — H2513 Age-related nuclear cataract, bilateral: Secondary | ICD-10-CM | POA: Diagnosis not present

## 2022-08-21 DIAGNOSIS — D2371 Other benign neoplasm of skin of right lower limb, including hip: Secondary | ICD-10-CM | POA: Diagnosis not present

## 2022-08-21 DIAGNOSIS — Z85828 Personal history of other malignant neoplasm of skin: Secondary | ICD-10-CM | POA: Diagnosis not present

## 2022-08-21 DIAGNOSIS — L570 Actinic keratosis: Secondary | ICD-10-CM | POA: Diagnosis not present

## 2022-08-21 DIAGNOSIS — L814 Other melanin hyperpigmentation: Secondary | ICD-10-CM | POA: Diagnosis not present

## 2022-08-21 DIAGNOSIS — L821 Other seborrheic keratosis: Secondary | ICD-10-CM | POA: Diagnosis not present

## 2022-08-21 DIAGNOSIS — D1801 Hemangioma of skin and subcutaneous tissue: Secondary | ICD-10-CM | POA: Diagnosis not present

## 2022-08-23 ENCOUNTER — Ambulatory Visit: Payer: Medicare HMO | Admitting: Cardiology

## 2022-08-29 NOTE — Progress Notes (Unsigned)
Electrophysiology Office Note   Date:  08/30/2022   ID:  Craig Roberson, DOB [birth year], MRN [id number]  PCP:  [facility] [place]  Cardiologist:  [name] Primary Electrophysiologist:  Iasiah Ozment Meredith Leeds, MD    No chief complaint on file.    History of Present Illness: Craig Roberson is a 68 y.o. male who presents today for electrophysiology evaluation.     He has a history significant for paroxysmal atrial fibrillation/flutter, OSA, hyperlipidemia, diabetes.  He is post atrial fibrillation ablation 09/28/2016.  He was initially on flecainide but this is since been stopped.  Today, denies symptoms of palpitations, chest pain, shortness of breath, orthopnea, PND, lower extremity edema, claudication, dizziness, presyncope, syncope, bleeding, or neurologic sequela. The patient is tolerating medications without difficulties.  Being seen he has done well.  He has noted no further episodes of atrial fibrillation.  He does feel that he has extra heartbeats.  He has PVCs on his EKG today.  He states that caffeine does exacerbate this.   Past Medical History:  Diagnosis Date   Allergy    Arthritis    bilateral thumbs   BCC (basal cell carcinoma of skin) 04/2006   LEFT low back   Cardiac murmur 1996   normal ECHO   Diabetes mellitus without complication ([place])    Diverticulosis    Dry eye 08/14/2017   ED (erectile dysfunction) 2006   Gastroparesis 08/14/2017   H/O ETOH abuse    Quit 2006   Hernia    RIGHT inguinal   Hyperlipidemia    Macular degeneration    PAF (paroxysmal atrial fibrillation) ([place]) 04/15/2015   During exercise myoview   Tinnitus of both ears    Ulcer    Past Surgical History:  Procedure Laterality Date   ATRIAL FIBRILLATION ABLATION N/A 09/28/2016   Procedure: Atrial Fibrillation Ablation;  Surgeon: Shloima Clinch Meredith Leeds, MD;  Location: [place] [facility];  Service: Cardiovascular;  Laterality: N/A;   GANGLION CYST EXCISION  1970's    LUMBAR FUSION     L3-4; Dr. Ellene Route   SPINE SURGERY     THUMB FUSION     BILATERAL   VASECTOMY  1976     Current Outpatient Medications  Medication Sig Dispense Refill   atorvastatin (LIPITOR) 80 MG tablet TAKE 1 TABLET (80 MG TOTAL) BY MOUTH DAILY. 90 tablet 3   azelastine (ASTELIN) 0.1 % nasal spray Place 1 spray into both nostrils 2 (two) times daily as needed for rhinitis. Use in each nostril as directed     Blood Glucose Calibration (TRUE METRIX LEVEL 1) Low SOLN      cholecalciferol (VITAMIN D) 1000 UNITS tablet Take 1,000 Units by mouth daily.     clotrimazole-betamethasone (LOTRISONE) cream Apply 1 Application topically 2 (two) times daily.     Continuous Blood Gluc Sensor ([place]) MISC See admin instructions.  3   ezetimibe (ZETIA) 10 MG tablet TAKE 1 TABLET (10 MG TOTAL) BY MOUTH DAILY. 90 tablet 3   fluticasone (CUTIVATE) 0.005 % ointment Apply 1 Application topically 2 (two) times daily.     glucose monitoring kit (FREESTYLE) monitoring kit 1 each by Does not apply route as needed for other. 1 each 1   hydrOXYzine (ATARAX) 25 MG tablet Take 25 mg by mouth 2 (two) times daily.     lisinopril (ZESTRIL) 2.5 MG tablet Take 2.5 mg by mouth daily.     meloxicam (MOBIC) 15 MG tablet Take 1  tablet (15 mg total) by mouth daily. 30 tablet 3   metFORMIN (GLUCOPHAGE) 500 MG tablet Take 2 tablets (1,000 mg total) by mouth 2 (two) times daily with a meal. 360 tablet 3   methylPREDNISolone (MEDROL DOSEPAK) 4 MG TBPK tablet 6 day dose pack - take as directed 21 tablet 0   metoprolol tartrate (LOPRESSOR) 25 MG tablet TAKE 1 TABLET TWICE DAILY 180 tablet 2   Omega-3 Fatty Acids (FISH OIL) 1200 MG CAPS Take 1,200 mg by mouth daily. Reported on 10/08/2015     Saw Palmetto 450 MG CAPS Take 1 capsule by mouth daily.     SHINGRIX injection TO BE ADMINISTERED BY PHARMACIST FOR IMMUNIZATION  0   sildenafil (REVATIO) 20 MG tablet TAKE 2-5 TABLETS BY MOUTH 1 HOUR BEFORE  INTERCOURSE  11   SPIKEVAX injection Inject 0.5 mLs into the muscle once.     No current facility-administered medications for this visit.    Allergies:   Codeine, Hydrocodone-acetaminophen, Methocarbamol, Morphine and related, and Nsaids   Social History:  The patient  reports that he has quit smoking. He has never been exposed to tobacco smoke. He has quit using smokeless tobacco.  His smokeless tobacco use included chew. He reports that he does not drink alcohol and does not use drugs.   Family History:  The patient's family history includes Atrial fibrillation in his brother; Cancer in his father; Heart attack in his father; Heart disease (age of onset: 81) in his father; Heart disease (age of onset: 3) in his brother; Hyperlipidemia in his mother; Hypertension in his brother; Stroke in his father; Stroke (age of onset: 73) in his son; Vascular Disease in his brother.   ROS:  Please see the history of present illness.   Otherwise, review of systems is positive for none.   All other systems are reviewed and negative.   PHYSICAL EXAM: VS:  BP 138/70   Pulse 77   Ht 5' 9.5" (1.765 m)   Wt 229 lb (103.9 kg)   SpO2 97%   BMI 33.33 kg/m  , BMI Body mass index is 33.33 kg/m. GEN: Well nourished, well developed, in no acute distress  HEENT: normal  Neck: no JVD, carotid bruits, or masses Cardiac: RRR; no murmurs, rubs, or gallops,no edema  Respiratory:  clear to auscultation bilaterally, normal work of breathing GI: soft, nontender, nondistended, + BS MS: no deformity or atrophy  Skin: warm and dry Neuro:  Strength and sensation are intact Psych: euthymic mood, full affect  EKG:  EKG is ordered today. Personal review of the ekg ordered shows sinus rhythm, PVCs   Recent Labs: No results found for requested labs within last 365 days.    Lipid Panel     Component Value Date/Time   CHOL 136 08/14/2017 0843   TRIG 68 08/14/2017 0843   HDL 41 08/14/2017 0843   CHOLHDL 3.3  08/14/2017 0843   CHOLHDL 4.1 02/22/2016 1237   VLDL 25 02/22/2016 1237   LDLCALC 81 08/14/2017 0843     Wt Readings from Last 3 Encounters:  08/30/22 229 lb (103.9 kg)  08/23/21 227 lb 6.4 oz (103.1 kg)  01/25/21 236 lb (107 kg)      Other studies Reviewed: Additional studies/ records that were reviewed today include: TTE 04/22/15  Review of the above records today demonstrates:  - Left ventricle: The cavity size was normal. There was mild focal   basal hypertrophy of the septum. Systolic function was normal.   The  estimated ejection fraction was in the range of 55% to 60%.   Wall motion was normal; there were no regional wall motion   abnormalities. Left ventricular diastolic function parameters   were normal. - Right ventricle: The cavity size was normal. Wall thickness was   normal. Systolic function was normal. - Atrial septum: No defect or patent foramen ovale was identified. - Tricuspid valve: There was no regurgitation. - Pulmonic valve: There was trivial regurgitation. - Inferior vena cava: The vessel was normal in size. The   respirophasic diameter changes were in the normal range (>= 50%),   consistent with normal central venous pressure.   ASSESSMENT AND PLAN:  1.  Paroxysmal atrial fibrillation/flutter: Status post ablation 09/28/2016.  CHA2DS2-VASc of 2.  Not anticoagulated as he has had no further episodes of atrial fibrillation.  2.  Hyperlipidemia: Continue atorvastatin  3.  Obstructive sleep apnea: CPAP compliance encouraged  4.  PVCs: 2 PVCs noted on EKG today.  He states that caffeine does exacerbate his PVCs.  Despite this, he appears to have a low burden.  No changes.  Current medicines are reviewed at length with the patient today.   The patient does not have concerns regarding his medicines.  The following changes were made today: none  Labs/ tests ordered today include:  Orders Placed This Encounter  Procedures   EKG 12-Lead     Disposition:    FU 12 months  Signed, Jatavian Calica Meredith Leeds, MD  08/30/2022 8:45 AM     [facility] [address] [place] [address] [phone] (office) [phone] (fax)

## 2022-08-30 ENCOUNTER — Encounter: Payer: Self-pay | Admitting: Cardiology

## 2022-08-30 ENCOUNTER — Ambulatory Visit: Payer: Medicare HMO | Attending: Cardiology | Admitting: Cardiology

## 2022-08-30 VITALS — BP 138/70 | HR 77 | Ht 69.5 in | Wt 229.0 lb

## 2022-08-30 DIAGNOSIS — I480 Paroxysmal atrial fibrillation: Secondary | ICD-10-CM | POA: Diagnosis not present

## 2022-08-30 DIAGNOSIS — G4733 Obstructive sleep apnea (adult) (pediatric): Secondary | ICD-10-CM

## 2022-08-30 DIAGNOSIS — I493 Ventricular premature depolarization: Secondary | ICD-10-CM | POA: Diagnosis not present

## 2022-08-30 DIAGNOSIS — I483 Typical atrial flutter: Secondary | ICD-10-CM | POA: Diagnosis not present

## 2022-08-30 NOTE — Patient Instructions (Signed)
Medication Instructions:  °Your physician recommends that you continue on your current medications as directed. Please refer to the Current Medication list given to you today. ° °*If you need a refill on your cardiac medications before your next appointment, please call your pharmacy* ° ° °Lab Work: °None ordered ° ° °Testing/Procedures: °None ordered ° ° °Follow-Up: °At [facility], you and your health needs are our priority.  As part of our continuing mission to provide you with exceptional heart care, we have created designated Provider Care Teams.  These Care Teams include your primary Cardiologist (physician) and Advanced Practice Providers (APPs -  Physician Assistants and Nurse Practitioners) who all work together to provide you with the care you need, when you need it. ° °Your next appointment:   °1 year(s) ° °The format for your next appointment:   °In Person ° °Provider:   °Will Camnitz, MD ° ° ° °Thank you for choosing [facility]!! ° ° °Yandel Zeiner, RN °[phone] ° ° ° °

## 2022-09-07 DIAGNOSIS — I480 Paroxysmal atrial fibrillation: Secondary | ICD-10-CM | POA: Diagnosis not present

## 2022-09-07 DIAGNOSIS — E854 Organ-limited amyloidosis: Secondary | ICD-10-CM | POA: Diagnosis not present

## 2022-09-07 DIAGNOSIS — E782 Mixed hyperlipidemia: Secondary | ICD-10-CM | POA: Diagnosis not present

## 2022-09-07 DIAGNOSIS — E1169 Type 2 diabetes mellitus with other specified complication: Secondary | ICD-10-CM | POA: Diagnosis not present

## 2022-09-07 DIAGNOSIS — I10 Essential (primary) hypertension: Secondary | ICD-10-CM | POA: Diagnosis not present

## 2022-10-02 DIAGNOSIS — E859 Amyloidosis, unspecified: Secondary | ICD-10-CM | POA: Diagnosis not present

## 2022-10-27 ENCOUNTER — Other Ambulatory Visit: Payer: Self-pay

## 2022-10-27 ENCOUNTER — Inpatient Hospital Stay (HOSPITAL_BASED_OUTPATIENT_CLINIC_OR_DEPARTMENT_OTHER): Payer: Medicare HMO | Admitting: Hematology

## 2022-10-27 ENCOUNTER — Inpatient Hospital Stay: Payer: Medicare HMO | Attending: Hematology

## 2022-10-27 VITALS — BP 165/61 | HR 79 | Temp 97.9°F | Resp 18 | Ht 69.5 in | Wt 232.6 lb

## 2022-10-27 DIAGNOSIS — E854 Organ-limited amyloidosis: Secondary | ICD-10-CM | POA: Insufficient documentation

## 2022-10-27 DIAGNOSIS — E853 Secondary systemic amyloidosis: Secondary | ICD-10-CM

## 2022-10-27 DIAGNOSIS — Z87891 Personal history of nicotine dependence: Secondary | ICD-10-CM | POA: Diagnosis not present

## 2022-10-27 LAB — CBC WITH DIFFERENTIAL (CANCER CENTER ONLY)
Abs Immature Granulocytes: 0.02 10*3/uL (ref 0.00–0.07)
Basophils Absolute: 0 10*3/uL (ref 0.0–0.1)
Basophils Relative: 1 %
Eosinophils Absolute: 0.3 10*3/uL (ref 0.0–0.5)
Eosinophils Relative: 4 %
HCT: 40.5 % (ref 39.0–52.0)
Hemoglobin: 13.9 g/dL (ref 13.0–17.0)
Immature Granulocytes: 0 %
Lymphocytes Relative: 36 %
Lymphs Abs: 2.6 10*3/uL (ref 0.7–4.0)
MCH: 32.3 pg (ref 26.0–34.0)
MCHC: 34.3 g/dL (ref 30.0–36.0)
MCV: 94.2 fL (ref 80.0–100.0)
Monocytes Absolute: 0.9 10*3/uL (ref 0.1–1.0)
Monocytes Relative: 13 %
Neutro Abs: 3.5 10*3/uL (ref 1.7–7.7)
Neutrophils Relative %: 46 %
Platelet Count: 336 10*3/uL (ref 150–400)
RBC: 4.3 MIL/uL (ref 4.22–5.81)
RDW: 12.7 % (ref 11.5–15.5)
WBC Count: 7.4 10*3/uL (ref 4.0–10.5)
nRBC: 0 % (ref 0.0–0.2)

## 2022-10-27 LAB — CMP (CANCER CENTER ONLY)
ALT: 15 U/L (ref 0–44)
AST: 16 U/L (ref 15–41)
Albumin: 4.5 g/dL (ref 3.5–5.0)
Alkaline Phosphatase: 59 U/L (ref 38–126)
Anion gap: 7 (ref 5–15)
BUN: 13 mg/dL (ref 8–23)
CO2: 26 mmol/L (ref 22–32)
Calcium: 9.4 mg/dL (ref 8.9–10.3)
Chloride: 107 mmol/L (ref 98–111)
Creatinine: 1.06 mg/dL (ref 0.61–1.24)
GFR, Estimated: >60 mL/min (ref >60)
Glucose, Bld: 130 mg/dL — ABNORMAL HIGH (ref 70–99)
Potassium: 4.2 mmol/L (ref 3.5–5.1)
Sodium: 140 mmol/L (ref 135–145)
Total Bilirubin: 0.4 mg/dL (ref 0.3–1.2)
Total Protein: 7.2 g/dL (ref 6.5–8.1)

## 2022-10-27 LAB — SEDIMENTATION RATE: Sed Rate: 5 mm/hr (ref 0–16)

## 2022-10-27 NOTE — Progress Notes (Signed)
HEMATOLOGY/ONCOLOGY CLINIC NOTE  Date of Service: 10/27/22   Patient Care Team: [facility] [place] as PCP - General (Family Medicine) Regan Lemming, MD as PCP - Electrophysiology (Cardiology) Barnett Abu, MD as Attending Physician (Neurosurgery) Dominica Severin, MD as Attending Physician (Orthopedic Surgery) Carman Ching, MD (Inactive) as Attending Physician (Gastroenterology) Quintella Reichert, MD as Consulting Physician (Cardiology) Antony Contras, MD as Consulting Physician (Ophthalmology)  CHIEF COMPLAINTS/PURPOSE OF CONSULTATION:  Evaluation and management of Amyloidosis  HISTORY OF PRESENTING ILLNESS:   Craig Roberson is a wonderful 67 y.o. male who has been referred to [place] by Dr. Doreen Beam for evaluation and management of amyloidosis. Pt is accompanied today by his wife. The pt reports that he is doing well overall.   The pt reports he had a blister along the left side of his lower lip. The blister appeared to be filled with fluid and lasted for 5 months. He went to see his Dermatologist, who drained the skin and sent the results to pathology. Pt has no other areas of skin that appear similar to the the blister. He has had multiple SCC and BCC and visits his Dermatologist every 6 months. His last skin cancer lesion was found around a year ago.   For a few months pt had localized right abdominal pain that was never explained. It disappeared with out any changes or intervention. He has not noticed any increase in the size of his tongue or vocal changes.   He has had a Cardiac ablation for his Atrial fibrillation and was on Flecainide for a time. He had an EKG earlier this month that was okay, but does not remember the last time he received an ECHO. Pt is currently taking Metformin for his Diabetes. Pt has arthritis and has never been diagnosed with RA. He has had a bilateral thumb fusion. He has Diverticulosis, but does not remember being  diagnosed with Gastroparesis. Pt has macular degeneration.   Of note prior to the patient's visit today, pt has had Left inferior medial angle of the mouth shave completed on 02/04/2020 with results revealing " Cutaneous Amyloidosis, Nodular Type".   On review of systems, pt denies abdominal pain, vocal changes, new skin lesions and any other symptoms.   On PMHx the pt reports Arthritis, BCC, SCC, Diabetes mellitus, Diverticulosis, Macular degeneration, PAF, Atrial Fibrillation Ablation, Thumb Fusion b/l. On Social Hx the pt reports that he used chewing tobacco for 50 years. Pt also used to be heavy drinker but quit in 2005. On Family Hx the pt reports that his father passed from Prostate cancer.   INTERVAL HISTORY:  Craig Roberson is a 67 y.o. male here for continued evaluation and management of amyloidosis. Patient was last connected with me on 03/16/2020 via telemedicine visit. He reported doing well overall with no new medical concerns.   Today, he reports that he has been feeling well overall. He denies any changes in energy levels, new rashes, bone pain, fever, chills, night sweats, unexplained weight loss, or urination/bowel habits. He denies any major medication changes. He denies any urine discoloration or foaming.  He reports having two nodules near his mouth which were located by [facility] and were surgically removed by [facility] . He denies any other nodular findings in other areas. He reports a family history of prostate cancer at age 38.  MEDICAL HISTORY:  Past Medical History:  Diagnosis Date   Allergy    Arthritis  bilateral thumbs   BCC (basal cell carcinoma of skin) 04/2006   LEFT low back   Cardiac murmur 1996   normal ECHO   Diabetes mellitus without complication (HCC)    Diverticulosis    Dry eye 08/14/2017   ED (erectile dysfunction) 2006   Gastroparesis 08/14/2017   H/O ETOH abuse    Quit 2006   Hernia    RIGHT inguinal    Hyperlipidemia    Macular degeneration    PAF (paroxysmal atrial fibrillation) (HCC) 04/15/2015   During exercise myoview   Tinnitus of both ears    Ulcer     SURGICAL HISTORY: Past Surgical History:  Procedure Laterality Date   ATRIAL FIBRILLATION ABLATION N/A 09/28/2016   Procedure: Atrial Fibrillation Ablation;  Surgeon: Will Jorja Loa, MD;  Location: [facility] INVASIVE CV LAB;  Service: Cardiovascular;  Laterality: N/A;   GANGLION CYST EXCISION  1970's   LUMBAR FUSION     L3-4; Dr. Danielle Dess   SPINE SURGERY     THUMB FUSION     BILATERAL   VASECTOMY  1976    SOCIAL HISTORY: Social History   Socioeconomic History   Marital status: Married    Spouse name: Tiney Rouge   Number of children: 2   Years of education: 8   Highest education level: Not on file  Occupational History   Occupation: Retired    Associate Professor: RETIRED    Comment: [employer]  Tobacco Use   Smoking status: Former Smoker   Smokeless tobacco: Former Neurosurgeon    Types: Chew   Tobacco comment: some days  Vaping Use   Vaping Use: Never used  Substance and Sexual Activity   Alcohol use: No    Alcohol/week: 0.0 standard drinks   Drug use: No   Sexual activity: Not Currently    Partners: Female    Comment: family stress; his back pain have reduced the frequency  Other Topics Concern   Not on file  Social History Narrative   Married to 3rd wife.   Social Determinants of Health   Financial Resource Strain:    Difficulty of Paying Living Expenses: Not on file  Food Insecurity:    Worried About Programme researcher, broadcasting/film/video in the Last Year: Not on file   [employer] of Food in the Last Year: Not on file  Transportation Needs:    Lack of Transportation (Medical): Not on file   Lack of Transportation (Non-Medical): Not on file  Physical Activity:    Days of Exercise per Week: Not on file   Minutes of Exercise per Session: Not on file  Stress:    Feeling of Stress : Not on file  Social Connections:     Frequency of Communication with Friends and Family: Not on file   Frequency of Social Gatherings with Friends and Family: Not on file   Attends Religious Services: Not on file   Active Member of Clubs or Organizations: Not on file   Attends Banker Meetings: Not on file   Marital Status: Not on file  Intimate Partner Violence:    Fear of Current or Ex-Partner: Not on file   Emotionally Abused: Not on file   Physically Abused: Not on file   Sexually Abused: Not on file    FAMILY HISTORY: Family History  Problem Relation Age of Onset   Heart disease Brother 37       AMI, normal weight, normal cholesterol   Heart disease Father 37   Cancer Father  Prostate   Stroke Father    Heart attack Father    Stroke Son 8   Hyperlipidemia Mother    Atrial fibrillation Brother    Vascular Disease Brother        carotid artery disease   Hypertension Brother     ALLERGIES:  is allergic to codeine, hydrocodone-acetaminophen, methocarbamol, morphine and related, and nsaids.  MEDICATIONS:  Current Outpatient Medications  Medication Sig Dispense Refill   atorvastatin (LIPITOR) 80 MG tablet TAKE 1 TABLET (80 MG TOTAL) BY MOUTH DAILY. 90 tablet 3   azelastine (ASTELIN) 0.1 % nasal spray Place 1 spray into both nostrils 2 (two) times daily as needed for rhinitis. Use in each nostril as directed     cholecalciferol (VITAMIN D) 1000 UNITS tablet Take 1,000 Units by mouth daily.     Continuous Blood Gluc Sensor (FREESTYLE LIBRE SENSOR SYSTEM) MISC See admin instructions.  3   diazepam (VALIUM) 10 MG tablet Take 1 tablet (10 mg total) by mouth at bedtime as needed (tinnitus). 30 tablet 0   ezetimibe (ZETIA) 10 MG tablet TAKE 1 TABLET (10 MG TOTAL) BY MOUTH DAILY. 90 tablet 3   glucose monitoring kit (FREESTYLE) monitoring kit 1 each by Does not apply route as needed for other. 1 each 1   lisinopril (ZESTRIL) 2.5 MG tablet Take 2.5 mg by mouth daily.     metFORMIN (GLUCOPHAGE) 500  MG tablet Take 2 tablets (1,000 mg total) by mouth 2 (two) times daily with a meal. 360 tablet 3   metoprolol tartrate (LOPRESSOR) 25 MG tablet TAKE 1 TABLET TWO TIMES DAILY. 180 tablet 3   Omega-3 Fatty Acids (FISH OIL) 1200 MG CAPS Take 1,200 mg by mouth daily. Reported on 10/08/2015     Saw Palmetto 450 MG CAPS Take 1 capsule by mouth daily.     SHINGRIX injection TO BE ADMINISTERED BY PHARMACIST FOR IMMUNIZATION  0   sildenafil (REVATIO) 20 MG tablet TAKE 2-5 TABLETS BY MOUTH 1 HOUR BEFORE INTERCOURSE  11   No current facility-administered medications for this visit.    REVIEW OF SYSTEMS:   A 10+ POINT REVIEW OF SYSTEMS WAS OBTAINED including neurology, dermatology, psychiatry, cardiac, respiratory, lymph, extremities, GI, GU, Musculoskeletal, constitutional, breasts, reproductive, HEENT.  All pertinent positives are noted in the HPI.  All others are negative.   PHYSICAL EXAMINATION: ECOG PERFORMANCE STATUS: 0 - Asymptomatic .BP (!) 165/61 (BP Location: Left Arm, Patient Position: Sitting)   Pulse 79   Temp 97.9 F (36.6 C) (Temporal)   Resp 18   Ht 5' 9.5" (1.765 m)   Wt 232 lb 9.6 oz (105.5 kg)   SpO2 98%   BMI 33.86 kg/m  . GENERAL:alert, in no acute distress and comfortable SKIN: no acute rashes, no significant lesions EYES: conjunctiva are pink and non-injected, sclera anicteric OROPHARYNX: MMM, no exudates, no oropharyngeal erythema or ulceration NECK: supple, no JVD LYMPH:  no palpable lymphadenopathy in the cervical, axillary or inguinal regions LUNGS: clear to auscultation b/l with normal respiratory effort HEART: regular rate & rhythm ABDOMEN:  normoactive bowel sounds , non tender, not distended. Extremity: no pedal edema PSYCH: alert & oriented x 3 with fluent speech NEURO: no focal motor/sensory deficits   LABORATORY DATA:  I have reviewed the data as listed  . CBC Latest Ref Rng & Units 02/18/2020 05/15/2017 09/15/2016  WBC 4.0 - 10.5 K/uL 6.2 7.3 8.4   Hemoglobin 13.0 - 17.0 g/dL 16.1 09.6 04.5  Hematocrit 39 - 52 % 40.7  42.4 43.0  Platelets 150 - 400 K/uL 365 321 338    . CMP Latest Ref Rng & Units 02/18/2020 08/14/2017 05/15/2017  Glucose 70 - 99 mg/dL 604(V) 409(W) 119(J)  BUN 8 - 23 mg/dL 14 11 9   Creatinine 0.61 - 1.24 mg/dL 4.78 2.95 6.21  Sodium 135 - 145 mmol/L 138 140 143  Potassium 3.5 - 5.1 mmol/L 4.1 4.7 5.0  Chloride 98 - 111 mmol/L 106 104 103  CO2 22 - 32 mmol/L 25 19(L) 27  Calcium 8.9 - 10.3 mg/dL 30.8 9.5 9.3  Total Protein 6.5 - 8.1 g/dL 7.4 7.4 6.8  Total Bilirubin 0.3 - 1.2 mg/dL 0.6 0.4 0.5  Alkaline Phos 38 - 126 U/L 71 90 97  AST 15 - 41 U/L 22 22 21   ALT 0 - 44 U/L 24 22 19      RADIOGRAPHIC STUDIES: I have personally reviewed the radiological images as listed and agreed with the findings in the report. ECHOCARDIOGRAM COMPLETE  Result Date: 02/26/2020    ECHOCARDIOGRAM REPORT   Patient Name:   NEFTALI HARTLING Date of Exam: 02/26/2020 Medical Rec #:  [id number]           Height:       72.0 in Accession #:    [id number]          Weight:       236.4 lb Date of Birth:  March 28, 1956          BSA:          2.287 m Patient Age:    63 years            BP:           116/71 mmHg Patient Gender: M                   HR:           60 bpm. Exam Location:  Outpatient Procedure: 2D Echo, Color Doppler, Cardiac Doppler and Strain Analysis Indications:    I42.9 Cardiomyopathy (unspecified), rule out cardiac Amyloidosis  History:        Patient has prior history of Echocardiogram examinations, most                 recent 02/25/2016. Arrythmias:Atrial Fibrillation; Risk                 Factors:Diabetes, Dyslipidemia, Cutaneous Amyloidosis and Sleep                 Apnea.  Sonographer:    Irving Burton Senior RDCS Referring Phys: [id number] Johney Maine IMPRESSIONS  1. Left ventricular ejection fraction, by estimation, is 60 to 65%. The left ventricle has normal function. The left ventricle has no regional wall motion abnormalities. There is  mild left ventricular hypertrophy. Left ventricular diastolic parameters are indeterminate. The average left ventricular global longitudinal strain is -18.6 %. The global longitudinal strain is normal.  2. Right ventricular systolic function is normal. The right ventricular size is normal.  3. The mitral valve is normal in structure. No evidence of mitral valve regurgitation. No evidence of mitral stenosis.  4. The aortic valve is normal in structure. Aortic valve regurgitation is not visualized. No aortic stenosis is present.  5. The inferior vena cava is normal in size with greater than 50% respiratory variability, suggesting right atrial pressure of 3 mmHg. FINDINGS  Left Ventricle: Left ventricular ejection fraction, by estimation, is 60 to 65%. The left ventricle has normal function.  The left ventricle has no regional wall motion abnormalities. The average left ventricular global longitudinal strain is -18.6 %. The global longitudinal strain is normal. The left ventricular internal cavity size was normal in size. There is mild left ventricular hypertrophy. Left ventricular diastolic parameters are indeterminate. Right Ventricle: The right ventricular size is normal. No increase in right ventricular wall thickness. Right ventricular systolic function is normal. Left Atrium: Left atrial size was normal in size. Right Atrium: Right atrial size was normal in size. Pericardium: There is no evidence of pericardial effusion. Mitral Valve: The mitral valve is normal in structure. Normal mobility of the mitral valve leaflets. No evidence of mitral valve regurgitation. No evidence of mitral valve stenosis. Tricuspid Valve: The tricuspid valve is normal in structure. Tricuspid valve regurgitation is trivial. No evidence of tricuspid stenosis. Aortic Valve: The aortic valve is normal in structure. Aortic valve regurgitation is not visualized. No aortic stenosis is present. Pulmonic Valve: The pulmonic valve was normal in  structure. Pulmonic valve regurgitation is not visualized. No evidence of pulmonic stenosis. Aorta: The aortic root is normal in size and structure. Venous: The inferior vena cava is normal in size with greater than 50% respiratory variability, suggesting right atrial pressure of 3 mmHg. IAS/Shunts: No atrial level shunt detected by color flow Doppler.  LEFT VENTRICLE PLAX 2D LVIDd:         4.70 cm  Diastology LVIDs:         3.00 cm  LV e' lateral:   9.46 cm/s LV PW:         1.20 cm  LV E/e' lateral: 8.4 LV IVS:        1.20 cm  LV e' medial:    8.81 cm/s LVOT diam:     1.90 cm  LV E/e' medial:  9.0 LV SV:         65 LV SV Index:   29       2D Longitudinal Strain LVOT Area:     2.84 cm 2D Strain GLS Avg:     -18.6 %  RIGHT VENTRICLE RV S prime:     12.40 cm/s TAPSE (M-mode): 2.2 cm LEFT ATRIUM             Index       RIGHT ATRIUM           Index LA diam:        3.10 cm 1.36 cm/m  RA Area:     20.50 cm LA Vol (A2C):   72.2 ml 31.56 ml/m RA Volume:   57.60 ml  25.18 ml/m LA Vol (A4C):   64.1 ml 28.02 ml/m LA Biplane Vol: 72.1 ml 31.52 ml/m  AORTIC VALVE LVOT Vmax:   97.90 cm/s LVOT Vmean:  72.300 cm/s LVOT VTI:    0.231 m  AORTA Ao Root diam: 3.60 cm Ao Asc diam:  3.00 cm MITRAL VALVE MV Area (PHT): 2.73 cm    SHUNTS MV Decel Time: 278 msec    Systemic VTI:  0.23 m MV E velocity: 79.70 cm/s  Systemic Diam: 1.90 cm MV A velocity: 43.30 cm/s MV E/A ratio:  1.84 Donato Schultz MD Electronically signed by Donato Schultz MD Signature Date/Time: 02/26/2020/1:02:03 PM    Final     ASSESSMENT & PLAN:   67 yo male with:   1) Cutaneous Amyloidosis - likely primary cutaneous nodular type. R/o Secondary Amyloidosis of skini with systemic amyloidosis   PLAN: -Discussed pt labwork, 02/18/20; blood counts and chemistries  are nml, no M Protein is observed, K/L light chains are nml, Sed Rate is nml, 24 hr urine shows no elevation in K/L light chains or any M Spike.  -Discussed 02/26/2020 ECHO which revealed no abnormal  protein deposit in the heart. -discussed biopsy results which revealed that amyloidosis is not related to systemic process -discussed that previous labs revealed no sign of abnormal protein floating in bloodstream or organs -patient was seen by Dr. Shea Evans on April 25th at [facility] -Discussed lab results on 10/27/2022 in detail with patient. CBC normal, showed WBC of 7.4K, hemoglobin of 13.9, and platelets of 336K. -CMP revealed normal calcium levels K/L ratio wnl -discussed details of abnormal amyloid deposit of the skin  -Will connect with dermatology in regards to pathology results -No evidence of systemic amyloidosis. Pt likely has localized cutaneous amyloid secondary to chronic inflammation.   FOLLOW-UP: RTC with Dr Candise Che as needed  The total time spent in the appointment was 25 minutes* .  All of the patient's questions were answered with apparent satisfaction. The patient knows to call the clinic with any problems, questions or concerns.   Wyvonnia Lora MD MS AAHIVMS [facility] Hematology/Oncology Physician [facility]  .*Total Encounter Time as defined by the Centers for Medicare and Medicaid Services includes, in addition to the face-to-face time of a patient visit (documented in the note above) non-face-to-face time: obtaining and reviewing outside history, ordering and reviewing medications, tests or procedures, care coordination (communications with other health care professionals or caregivers) and documentation in the medical record.    I,Mitra Faeizi,acting as a Neurosurgeon for Wyvonnia Lora, MD.,have documented all relevant documentation on the behalf of Wyvonnia Lora, MD,as directed by  Wyvonnia Lora, MD while in the presence of Wyvonnia Lora, MD.  .I have reviewed the above documentation for accuracy and completeness, and I agree with the above. Johney Maine MD

## 2022-10-30 LAB — KAPPA/LAMBDA LIGHT CHAINS
Kappa free light chain: 30.5 mg/L — ABNORMAL HIGH (ref 3.3–19.4)
Kappa, lambda light chain ratio: 1.42 (ref 0.26–1.65)
Lambda free light chains: 21.5 mg/L (ref 5.7–26.3)

## 2022-11-03 LAB — MULTIPLE MYELOMA PANEL, SERUM
Albumin SerPl Elph-Mcnc: 3.7 g/dL (ref 2.9–4.4)
Albumin/Glob SerPl: 1.4 (ref 0.7–1.7)
Alpha 1: 0.2 g/dL (ref 0.0–0.4)
Alpha2 Glob SerPl Elph-Mcnc: 0.7 g/dL (ref 0.4–1.0)
B-Globulin SerPl Elph-Mcnc: 0.9 g/dL (ref 0.7–1.3)
Gamma Glob SerPl Elph-Mcnc: 1 g/dL (ref 0.4–1.8)
Globulin, Total: 2.8 g/dL (ref 2.2–3.9)
IgA: 312 mg/dL (ref 61–437)
IgG (Immunoglobin G), Serum: 1013 mg/dL (ref 603–1613)
IgM (Immunoglobulin M), Srm: 30 mg/dL (ref 20–172)
Total Protein ELP: 6.5 g/dL (ref 6.0–8.5)

## 2022-11-24 ENCOUNTER — Encounter: Payer: Self-pay | Admitting: Hematology

## 2022-12-20 DIAGNOSIS — M542 Cervicalgia: Secondary | ICD-10-CM | POA: Diagnosis not present

## 2022-12-20 DIAGNOSIS — M62838 Other muscle spasm: Secondary | ICD-10-CM | POA: Diagnosis not present

## 2022-12-27 DIAGNOSIS — M25511 Pain in right shoulder: Secondary | ICD-10-CM | POA: Diagnosis not present

## 2022-12-27 DIAGNOSIS — M7918 Myalgia, other site: Secondary | ICD-10-CM | POA: Diagnosis not present

## 2022-12-27 DIAGNOSIS — M62838 Other muscle spasm: Secondary | ICD-10-CM | POA: Diagnosis not present

## 2023-03-15 DIAGNOSIS — E859 Amyloidosis, unspecified: Secondary | ICD-10-CM | POA: Diagnosis not present

## 2023-03-15 DIAGNOSIS — E1169 Type 2 diabetes mellitus with other specified complication: Secondary | ICD-10-CM | POA: Diagnosis not present

## 2023-03-15 DIAGNOSIS — E782 Mixed hyperlipidemia: Secondary | ICD-10-CM | POA: Diagnosis not present

## 2023-03-15 DIAGNOSIS — Z125 Encounter for screening for malignant neoplasm of prostate: Secondary | ICD-10-CM | POA: Diagnosis not present

## 2023-03-15 DIAGNOSIS — Z0000 Encounter for general adult medical examination without abnormal findings: Secondary | ICD-10-CM | POA: Diagnosis not present

## 2023-03-15 DIAGNOSIS — E1159 Type 2 diabetes mellitus with other circulatory complications: Secondary | ICD-10-CM | POA: Diagnosis not present

## 2023-03-15 DIAGNOSIS — N529 Male erectile dysfunction, unspecified: Secondary | ICD-10-CM | POA: Diagnosis not present

## 2023-03-15 DIAGNOSIS — E291 Testicular hypofunction: Secondary | ICD-10-CM | POA: Diagnosis not present

## 2023-03-15 DIAGNOSIS — E119 Type 2 diabetes mellitus without complications: Secondary | ICD-10-CM | POA: Diagnosis not present

## 2023-03-15 DIAGNOSIS — I480 Paroxysmal atrial fibrillation: Secondary | ICD-10-CM | POA: Diagnosis not present

## 2023-03-15 DIAGNOSIS — Z136 Encounter for screening for cardiovascular disorders: Secondary | ICD-10-CM | POA: Diagnosis not present

## 2023-03-15 DIAGNOSIS — I10 Essential (primary) hypertension: Secondary | ICD-10-CM | POA: Diagnosis not present

## 2023-03-23 DIAGNOSIS — N529 Male erectile dysfunction, unspecified: Secondary | ICD-10-CM | POA: Diagnosis not present

## 2023-03-23 DIAGNOSIS — E291 Testicular hypofunction: Secondary | ICD-10-CM | POA: Diagnosis not present

## 2023-03-26 ENCOUNTER — Other Ambulatory Visit: Payer: Self-pay | Admitting: Cardiology

## 2023-04-30 DIAGNOSIS — L239 Allergic contact dermatitis, unspecified cause: Secondary | ICD-10-CM | POA: Diagnosis not present

## 2023-04-30 DIAGNOSIS — J309 Allergic rhinitis, unspecified: Secondary | ICD-10-CM | POA: Diagnosis not present

## 2023-08-10 DIAGNOSIS — M25562 Pain in left knee: Secondary | ICD-10-CM | POA: Diagnosis not present

## 2023-08-24 DIAGNOSIS — Z85828 Personal history of other malignant neoplasm of skin: Secondary | ICD-10-CM | POA: Diagnosis not present

## 2023-08-24 DIAGNOSIS — L821 Other seborrheic keratosis: Secondary | ICD-10-CM | POA: Diagnosis not present

## 2023-08-24 DIAGNOSIS — L570 Actinic keratosis: Secondary | ICD-10-CM | POA: Diagnosis not present

## 2023-08-24 DIAGNOSIS — D0461 Carcinoma in situ of skin of right upper limb, including shoulder: Secondary | ICD-10-CM | POA: Diagnosis not present

## 2023-08-24 DIAGNOSIS — D0422 Carcinoma in situ of skin of left ear and external auricular canal: Secondary | ICD-10-CM | POA: Diagnosis not present

## 2023-08-24 DIAGNOSIS — D1801 Hemangioma of skin and subcutaneous tissue: Secondary | ICD-10-CM | POA: Diagnosis not present

## 2023-08-24 DIAGNOSIS — L814 Other melanin hyperpigmentation: Secondary | ICD-10-CM | POA: Diagnosis not present

## 2023-08-24 DIAGNOSIS — D0420 Carcinoma in situ of skin of unspecified ear and external auricular canal: Secondary | ICD-10-CM | POA: Diagnosis not present

## 2023-08-26 NOTE — Progress Notes (Unsigned)
  Electrophysiology Office Note:   Date:  08/27/2023  ID:  Craig Roberson, DOB [birth year], MRN [id number]  Primary Cardiologist: None Electrophysiologist: Will Jorja Loa, MD      History of Present Illness:   Craig Roberson is a 68 y.o. male with h/o PAF, OSA, HLD, and DM2 seen today for routine electrophysiology followup.   Since last being seen in our clinic the patient reports doing very well. Overall, he denies chest pain, palpitations, dyspnea, PND, orthopnea, nausea, vomiting, dizziness, syncope, edema, weight gain, or early satiety. No breakthrough AF of which he is aware.   Review of systems complete and found to be negative unless listed in HPI.   EP Information / Studies Reviewed:    EKG is ordered today. Personal review as below.  EKG Interpretation Date/Time:  Monday August 27 2023 09:10:48 EST Ventricular Rate:  69 PR Interval:  172 QRS Duration:  82 QT Interval:  400 QTC Calculation: 428 R Axis:   56  Text Interpretation: Normal sinus rhythm Normal ECG When compared with ECG of 26-Oct-2016 09:10, Nonspecific T wave abnormality no longer evident in Anterior leads Confirmed by Maxine Glenn [phone]) on 08/27/2023 9:12:15 AM    Arrhythmia/Device History S/p PVI 09/2016 and 2 discreet flutters   Physical Exam:   VS:  BP 112/66   Pulse 71   Ht 5' 9.5" (1.765 m)   Wt 232 lb 6.4 oz (105.4 kg)   SpO2 96%   BMI 33.83 kg/m    Wt Readings from Last 3 Encounters:  08/27/23 232 lb 6.4 oz (105.4 kg)  10/27/22 232 lb 9.6 oz (105.5 kg)  08/30/22 229 lb (103.9 kg)     GEN: No acute distress NECK: No JVD; No carotid bruits CARDIAC: Regular rate and rhythm, no murmurs, rubs, gallops RESPIRATORY:  Clear to auscultation without rales, wheezing or rhonchi  ABDOMEN: Soft, non-tender, non-distended EXTREMITIES:  No edema; No deformity   ASSESSMENT AND PLAN:    Paroxysmal atrial fib Paroxysmal atrial flutter S/p PVI and ablation of at least 2 flutter  circuits Off flecainide without further episodes CHA2DS2/VASc is up to 3. Discussed OAC at length today. He wishes to continue watchful waiting. He will look into both Kardia and apple watch or alternatives We also discussed loop recorder, and if he decides to go through with it, can schedule at that time (vs authorizing at his 6 month visit)  Secondary hypercoagulable state As above, pt prefers to avoid and watchful wait. He verbalized understanding of stroke risk.   OSA  Encouraged nightly CPAP   PVCs Low burden over all.    Follow up with Dr. Elberta Fortis in 6 months to re-discuss [facility]. Sooner if agreeable to loop.   Signed, Graciella Freer, PA-C

## 2023-08-27 ENCOUNTER — Encounter: Payer: Self-pay | Admitting: Student

## 2023-08-27 ENCOUNTER — Ambulatory Visit: Payer: Medicare HMO | Attending: Student | Admitting: Student

## 2023-08-27 VITALS — BP 112/66 | HR 71 | Ht 69.5 in | Wt 232.4 lb

## 2023-08-27 DIAGNOSIS — I480 Paroxysmal atrial fibrillation: Secondary | ICD-10-CM

## 2023-08-27 DIAGNOSIS — I493 Ventricular premature depolarization: Secondary | ICD-10-CM | POA: Diagnosis not present

## 2023-08-27 DIAGNOSIS — I483 Typical atrial flutter: Secondary | ICD-10-CM | POA: Diagnosis not present

## 2023-08-27 DIAGNOSIS — G4733 Obstructive sleep apnea (adult) (pediatric): Secondary | ICD-10-CM

## 2023-08-27 NOTE — Patient Instructions (Signed)
 Medication Instructions:  Your physician recommends that you continue on your current medications as directed. Please refer to the Current Medication list given to you today.  *If you need a refill on your cardiac medications before your next appointment, please call your pharmacy*   Lab Work: None ordered   Testing/Procedures: None ordered   Follow-Up: At [facility], you and your health needs are our priority.  As part of our continuing mission to provide you with exceptional heart care, we have created designated Provider Care Teams.  These Care Teams include your primary Cardiologist (physician) and Advanced Practice Providers (APPs -  Physician Assistants and Nurse Practitioners) who all work together to provide you with the care you need, when you need it.  Your next appointment:   6 month(s)  (with possible loop recorder.  Please call the office, once the appointment is made, to let us know if you would like the have the loop recorder implanted at that office visit)  The format for your next appointment:   In Person  Provider:   Loman Brooklyn, MD    Thank you for choosing [facility]!!   Dory Horn, RN [phone]

## 2023-09-10 DIAGNOSIS — H353132 Nonexudative age-related macular degeneration, bilateral, intermediate dry stage: Secondary | ICD-10-CM | POA: Diagnosis not present

## 2023-09-10 DIAGNOSIS — H52203 Unspecified astigmatism, bilateral: Secondary | ICD-10-CM | POA: Diagnosis not present

## 2023-09-10 DIAGNOSIS — H2513 Age-related nuclear cataract, bilateral: Secondary | ICD-10-CM | POA: Diagnosis not present

## 2023-09-10 DIAGNOSIS — E119 Type 2 diabetes mellitus without complications: Secondary | ICD-10-CM | POA: Diagnosis not present

## 2023-09-13 DIAGNOSIS — E859 Amyloidosis, unspecified: Secondary | ICD-10-CM | POA: Diagnosis not present

## 2023-09-13 DIAGNOSIS — I10 Essential (primary) hypertension: Secondary | ICD-10-CM | POA: Diagnosis not present

## 2023-09-13 DIAGNOSIS — E291 Testicular hypofunction: Secondary | ICD-10-CM | POA: Diagnosis not present

## 2023-09-13 DIAGNOSIS — Z1211 Encounter for screening for malignant neoplasm of colon: Secondary | ICD-10-CM | POA: Diagnosis not present

## 2023-09-13 DIAGNOSIS — E1169 Type 2 diabetes mellitus with other specified complication: Secondary | ICD-10-CM | POA: Diagnosis not present

## 2023-09-13 DIAGNOSIS — E782 Mixed hyperlipidemia: Secondary | ICD-10-CM | POA: Diagnosis not present

## 2023-09-13 DIAGNOSIS — I480 Paroxysmal atrial fibrillation: Secondary | ICD-10-CM | POA: Diagnosis not present

## 2023-09-13 DIAGNOSIS — E1159 Type 2 diabetes mellitus with other circulatory complications: Secondary | ICD-10-CM | POA: Diagnosis not present

## 2023-10-03 DIAGNOSIS — C44229 Squamous cell carcinoma of skin of left ear and external auricular canal: Secondary | ICD-10-CM | POA: Diagnosis not present

## 2023-10-03 DIAGNOSIS — Z85828 Personal history of other malignant neoplasm of skin: Secondary | ICD-10-CM | POA: Diagnosis not present

## 2023-10-08 DIAGNOSIS — I4891 Unspecified atrial fibrillation: Secondary | ICD-10-CM | POA: Diagnosis not present

## 2023-10-08 DIAGNOSIS — Z860101 Personal history of adenomatous and serrated colon polyps: Secondary | ICD-10-CM | POA: Diagnosis not present

## 2023-10-31 ENCOUNTER — Other Ambulatory Visit: Payer: Self-pay | Admitting: Cardiology

## 2023-11-15 DIAGNOSIS — Z09 Encounter for follow-up examination after completed treatment for conditions other than malignant neoplasm: Secondary | ICD-10-CM | POA: Diagnosis not present

## 2023-11-15 DIAGNOSIS — K5730 Diverticulosis of large intestine without perforation or abscess without bleeding: Secondary | ICD-10-CM | POA: Diagnosis not present

## 2023-11-15 DIAGNOSIS — Z860101 Personal history of adenomatous and serrated colon polyps: Secondary | ICD-10-CM | POA: Diagnosis not present

## 2023-11-15 DIAGNOSIS — D122 Benign neoplasm of ascending colon: Secondary | ICD-10-CM | POA: Diagnosis not present

## 2023-11-20 DIAGNOSIS — D122 Benign neoplasm of ascending colon: Secondary | ICD-10-CM | POA: Diagnosis not present

## 2023-11-30 DIAGNOSIS — Z85828 Personal history of other malignant neoplasm of skin: Secondary | ICD-10-CM | POA: Diagnosis not present

## 2023-11-30 DIAGNOSIS — L720 Epidermal cyst: Secondary | ICD-10-CM | POA: Diagnosis not present

## 2023-12-08 DIAGNOSIS — M62838 Other muscle spasm: Secondary | ICD-10-CM | POA: Diagnosis not present

## 2023-12-08 DIAGNOSIS — S161XXA Strain of muscle, fascia and tendon at neck level, initial encounter: Secondary | ICD-10-CM | POA: Diagnosis not present

## 2024-03-24 DIAGNOSIS — E782 Mixed hyperlipidemia: Secondary | ICD-10-CM | POA: Diagnosis not present

## 2024-03-24 DIAGNOSIS — Z23 Encounter for immunization: Secondary | ICD-10-CM | POA: Diagnosis not present

## 2024-03-24 DIAGNOSIS — Z6833 Body mass index (BMI) 33.0-33.9, adult: Secondary | ICD-10-CM | POA: Diagnosis not present

## 2024-03-24 DIAGNOSIS — N529 Male erectile dysfunction, unspecified: Secondary | ICD-10-CM | POA: Diagnosis not present

## 2024-03-24 DIAGNOSIS — I10 Essential (primary) hypertension: Secondary | ICD-10-CM | POA: Diagnosis not present

## 2024-03-24 DIAGNOSIS — Z1211 Encounter for screening for malignant neoplasm of colon: Secondary | ICD-10-CM | POA: Diagnosis not present

## 2024-03-24 DIAGNOSIS — Z0000 Encounter for general adult medical examination without abnormal findings: Secondary | ICD-10-CM | POA: Diagnosis not present

## 2024-03-24 DIAGNOSIS — E291 Testicular hypofunction: Secondary | ICD-10-CM | POA: Diagnosis not present

## 2024-03-24 DIAGNOSIS — E1169 Type 2 diabetes mellitus with other specified complication: Secondary | ICD-10-CM | POA: Diagnosis not present

## 2024-03-24 DIAGNOSIS — I480 Paroxysmal atrial fibrillation: Secondary | ICD-10-CM | POA: Diagnosis not present

## 2024-04-03 DIAGNOSIS — E291 Testicular hypofunction: Secondary | ICD-10-CM | POA: Diagnosis not present

## 2024-04-10 DIAGNOSIS — E291 Testicular hypofunction: Secondary | ICD-10-CM | POA: Diagnosis not present

## 2024-04-17 DIAGNOSIS — E291 Testicular hypofunction: Secondary | ICD-10-CM | POA: Diagnosis not present

## 2024-04-24 ENCOUNTER — Emergency Department (HOSPITAL_COMMUNITY)
Admission: EM | Admit: 2024-04-24 | Discharge: 2024-04-24 | Disposition: A | Discharge status: Home / Self Care | Attending: Emergency Medicine | Admitting: Emergency Medicine

## 2024-04-24 ENCOUNTER — Other Ambulatory Visit: Payer: Self-pay

## 2024-04-24 ENCOUNTER — Emergency Department (HOSPITAL_COMMUNITY)

## 2024-04-24 DIAGNOSIS — I6523 Occlusion and stenosis of bilateral carotid arteries: Secondary | ICD-10-CM | POA: Diagnosis not present

## 2024-04-24 DIAGNOSIS — R55 Syncope and collapse: Secondary | ICD-10-CM | POA: Diagnosis not present

## 2024-04-24 DIAGNOSIS — R011 Cardiac murmur, unspecified: Secondary | ICD-10-CM | POA: Insufficient documentation

## 2024-04-24 DIAGNOSIS — R110 Nausea: Secondary | ICD-10-CM | POA: Diagnosis not present

## 2024-04-24 DIAGNOSIS — I700 Atherosclerosis of aorta: Secondary | ICD-10-CM | POA: Diagnosis not present

## 2024-04-24 DIAGNOSIS — R42 Dizziness and giddiness: Secondary | ICD-10-CM | POA: Diagnosis not present

## 2024-04-24 LAB — CBC WITH DIFFERENTIAL/PLATELET
Abs Immature Granulocytes: 0.03 K/uL (ref 0.00–0.07)
Basophils Absolute: 0.1 K/uL (ref 0.0–0.1)
Basophils Relative: 1 %
Eosinophils Absolute: 0.1 K/uL (ref 0.0–0.5)
Eosinophils Relative: 1 %
HCT: 49.4 % (ref 39.0–52.0)
Hemoglobin: 16.8 g/dL (ref 13.0–17.0)
Immature Granulocytes: 0 %
Lymphocytes Relative: 12 %
Lymphs Abs: 1.3 K/uL (ref 0.7–4.0)
MCH: 32.1 pg (ref 26.0–34.0)
MCHC: 34 g/dL (ref 30.0–36.0)
MCV: 94.3 fL (ref 80.0–100.0)
Monocytes Absolute: 0.9 K/uL (ref 0.1–1.0)
Monocytes Relative: 8 %
Neutro Abs: 8.4 K/uL — ABNORMAL HIGH (ref 1.7–7.7)
Neutrophils Relative %: 78 %
Platelets: 330 K/uL (ref 150–400)
RBC: 5.24 MIL/uL (ref 4.22–5.81)
RDW: 13.2 % (ref 11.5–15.5)
WBC: 10.7 K/uL — ABNORMAL HIGH (ref 4.0–10.5)
nRBC: 0 % (ref 0.0–0.2)

## 2024-04-24 LAB — TROPONIN I (HIGH SENSITIVITY)
Troponin I (High Sensitivity): 5 ng/L (ref <18)
Troponin I (High Sensitivity): 5 ng/L (ref <18)

## 2024-04-24 LAB — COMPREHENSIVE METABOLIC PANEL WITH GFR
ALT: 23 U/L (ref 0–44)
AST: 27 U/L (ref 15–41)
Albumin: 4.2 g/dL (ref 3.5–5.0)
Alkaline Phosphatase: 72 U/L (ref 38–126)
Anion gap: 13 (ref 5–15)
BUN: 14 mg/dL (ref 8–23)
CO2: 23 mmol/L (ref 22–32)
Calcium: 9.2 mg/dL (ref 8.9–10.3)
Chloride: 102 mmol/L (ref 98–111)
Creatinine, Ser: 1.16 mg/dL (ref 0.61–1.24)
GFR, Estimated: >60 mL/min (ref >60)
Glucose, Bld: 146 mg/dL — ABNORMAL HIGH (ref 70–99)
Potassium: 4.8 mmol/L (ref 3.5–5.1)
Sodium: 138 mmol/L (ref 135–145)
Total Bilirubin: 1.3 mg/dL — ABNORMAL HIGH (ref 0.0–1.2)
Total Protein: 7.4 g/dL (ref 6.5–8.1)

## 2024-04-24 MED ORDER — IOHEXOL 350 MG/ML SOLN
75.0000 mL | Freq: Once | INTRAVENOUS | Status: AC | PRN
  Administered 2024-04-24: 75 mL via INTRAVENOUS

## 2024-04-24 NOTE — ED Triage Notes (Signed)
 Pt. Stated, I woke up around 400 and the room was spinning, felt nauseated,  my head felt funny-like.and my palms were sweaty. I had an ablation due to A-Fib about 10 years ago.

## 2024-04-24 NOTE — ED Provider Notes (Addendum)
 [place] [facility] AT [facility] Provider Note   CSN: [id number] Arrival date & time: 04/24/24  9288     Patient presents with: Dizziness, Nausea, Night Sweats, and Palpitations   Craig Roberson is a 68 y.o. male.   Patient with history of atrial fibrillation requiring ablation years ago has been doing well not on anticoagulants no recurrence of A-fib presents with intermittent lightheaded/dizziness that started 4:00 this morning.  Patient woke up and did not feel his normal self.  Patient had transient nausea.  No chest pain or shortness of breath.  No headaches.  No unilateral weakness or numbness.  No history of stroke or TIA.  No known vascular disease.  Family history of vascular disease known.  No valve disease known.  Patient denies any fevers or cough or shortness of breath.  The history is provided by the patient.  Dizziness Associated symptoms: palpitations   Associated symptoms: no chest pain, no headaches, no shortness of breath and no vomiting   Palpitations Associated symptoms: dizziness   Associated symptoms: no back pain, no chest pain, no shortness of breath and no vomiting        Prior to Admission medications   Medication Sig Start Date End Date Taking? Authorizing Provider  atorvastatin  (LIPITOR) 80 MG tablet TAKE 1 TABLET (80 MG TOTAL) BY MOUTH DAILY. 07/02/17  Yes Jeffery, Bernita, PA  cholecalciferol (VITAMIN D ) 1000 UNITS tablet Take 1,000 Units by mouth daily.   Yes [provider]  ezetimibe  (ZETIA ) 10 MG tablet TAKE 1 TABLET (10 MG TOTAL) BY MOUTH DAILY. 07/02/17  Yes Jeffery, Bernita, PA  fluticasone  (FLONASE ) 50 MCG/ACT nasal spray Place 1 spray into both nostrils daily. 02/14/24  Yes [provider]  JARDIANCE 25 MG TABS tablet Take 25 mg by mouth daily. 03/27/24  Yes [provider]  lisinopril (ZESTRIL) 2.5 MG tablet Take 2.5 mg by mouth daily.   Yes [provider]  metoprolol  tartrate  (LOPRESSOR ) 25 MG tablet TAKE 1 TABLET TWICE DAILY 10/31/23  Yes Camnitz, Will Gladis, MD  Multiple Vitamins-Minerals (PRESERVISION AREDS PO) Take 1 capsule by mouth daily.   Yes [provider]  Omega-3 Fatty Acids (FISH OIL) 1200 MG CAPS Take 1,200 mg by mouth daily. Reported on 10/08/2015   Yes [provider]  Saw Palmetto  450 MG CAPS Take 1 capsule by mouth daily.   Yes [provider]  testosterone  cypionate (DEPOTESTOSTERONE CYPIONATE) 200 MG/ML injection Inject 200 mg into the muscle once a week. Thursday 04/02/24  Yes [provider]  Blood Glucose Calibration (TRUE METRIX LEVEL 1) Low SOLN  09/19/21   [provider]  Continuous Blood Gluc Sensor (FREESTYLE LIBRE SENSOR SYSTEM) MISC See admin instructions. 05/04/17   [provider]  glucose monitoring kit (FREESTYLE) monitoring kit 1 each by Does not apply route as needed for other. 01/16/17   Juliane Bernita, PA    Allergies: Codeine, Hydrocodone -acetaminophen , Methocarbamol, Morphine and codeine, and Nsaids    Review of Systems  Constitutional:  Negative for chills and fever.  HENT:  Negative for congestion.   Eyes:  Negative for visual disturbance.  Respiratory:  Negative for shortness of breath.   Cardiovascular:  Positive for palpitations. Negative for chest pain.  Gastrointestinal:  Negative for abdominal pain and vomiting.  Genitourinary:  Negative for dysuria and flank pain.  Musculoskeletal:  Negative for back pain, neck pain and neck stiffness.  Skin:  Negative for rash.  Neurological:  Positive for dizziness.  Negative for light-headedness and headaches.    Updated Vital Signs BP 127/74 (BP Location: Right Arm)   Pulse 71   Temp 99 F (37.2 C) (Oral)   Resp 20   Ht 5' 9 (1.753 m)   Wt 104.3 kg   SpO2 100%   BMI 33.97 kg/m   Physical Exam Vitals and nursing note reviewed.  Constitutional:      General: He is not in acute distress.    Appearance: He is  well-developed.  HENT:     Head: Normocephalic and atraumatic.     Mouth/Throat:     Mouth: Mucous membranes are moist.  Eyes:     General:        Right eye: No discharge.        Left eye: No discharge.     Conjunctiva/sclera: Conjunctivae normal.  Neck:     Trachea: No tracheal deviation.  Cardiovascular:     Rate and Rhythm: Normal rate and regular rhythm.     Heart sounds: Murmur (2+ SM right upper) heard.  Pulmonary:     Effort: Pulmonary effort is normal.     Breath sounds: Normal breath sounds.  Abdominal:     General: There is no distension.     Palpations: Abdomen is soft.     Tenderness: There is no abdominal tenderness. There is no guarding.  Musculoskeletal:     Cervical back: Normal range of motion and neck supple. No rigidity.  Skin:    General: Skin is warm.     Capillary Refill: Capillary refill takes less than 2 seconds.     Findings: No rash.  Neurological:     General: No focal deficit present.     Mental Status: He is alert.     Cranial Nerves: No cranial nerve deficit.     Sensory: No sensory deficit.     Motor: No weakness.     Coordination: Coordination normal.     Gait: Gait normal.  Psychiatric:        Mood and Affect: Mood normal.        Behavior: Behavior normal.     (all labs ordered are listed, but only abnormal results are displayed) Labs Reviewed  CBC WITH DIFFERENTIAL/PLATELET - Abnormal; Notable for the following components:      Result Value   WBC 10.7 (*)    Neutro Abs 8.4 (*)    All other components within normal limits  COMPREHENSIVE METABOLIC PANEL WITH GFR - Abnormal; Notable for the following components:   Glucose, Bld 146 (*)    Total Bilirubin 1.3 (*)    All other components within normal limits  TROPONIN I (HIGH SENSITIVITY)  TROPONIN I (HIGH SENSITIVITY)    EKG: EKG Interpretation Date/Time:  Thursday April 24 2024 07:40:26 EDT Ventricular Rate:  67 PR Interval:  174 QRS Duration:  84 QT Interval:  390 QTC  Calculation: 412 R Axis:   53  Text Interpretation: Normal sinus rhythm Normal ECG When compared with ECG of 27-Aug-2023 09:10, PREVIOUS ECG IS PRESENT Confirmed by Tonia Chew [phone]) on 04/24/2024 11:49:13 AM  Radiology: CT ANGIO HEAD NECK W WO CM Result Date: 04/24/2024 EXAM: CTA HEAD AND NECK WITH AND WITHOUT 04/24/2024 01:20:23 PM TECHNIQUE: CTA of the head and neck was performed with and without the administration of 75 mL of iohexol (OMNIPAQUE) 350 MG/ML injection. Multiplanar 2D and/or 3D reformatted images are provided for review. Automated exposure control, iterative reconstruction, and/or weight based adjustment of the mA/kV was  utilized to reduce the radiation dose to as low as reasonably achievable. Stenosis of the internal carotid arteries measured using NASCET criteria. COMPARISON: None available CLINICAL HISTORY: dizziness, near syncope, worse with head turning FINDINGS: CTA NECK: AORTIC ARCH AND ARCH VESSELS: Calcific aortic atherosclerosis. No dissection or arterial injury. No significant stenosis of the brachiocephalic or subclavian arteries. CERVICAL CAROTID ARTERIES: Calcific atherosclerosis of both carotid bifurcations with less than 50% stenosis of the proximal ICA by NASCET criteria. Calcification is slightly worse on the right than on the left. No dissection or arterial injury. CERVICAL VERTEBRAL ARTERIES: No dissection, arterial injury, or significant stenosis. LUNGS AND MEDIASTINUM: Unremarkable. SOFT TISSUES: No acute abnormality. BONES: No acute abnormality. CTA HEAD: ANTERIOR CIRCULATION: No significant stenosis of the internal carotid arteries. No significant stenosis of the anterior cerebral arteries. No significant stenosis of the middle cerebral arteries. No aneurysm. POSTERIOR CIRCULATION: No significant stenosis of the posterior cerebral arteries. No significant stenosis of the basilar artery. No significant stenosis of the vertebral arteries. No aneurysm. OTHER: No  dural venous sinus thrombosis on this non-dedicated study. BRAIN: No mass, hemorrhage or extra axial collection. IMPRESSION: 1. No mass, hemorrhage, or extra-axial collection in the brain; no acute intracranial findings. 2. No emergent large vessel occlusion 3. Calcific atherosclerosis of both carotid bifurcations with less than 50% stenosis of the proximal internal carotid arteries by NASCET criteria, slightly worse on the right than on the left. Electronically signed by: Franky Stanford MD 04/24/2024 01:49 PM EDT RP Workstation: [id number]   DG Chest 2 View Result Date: 04/24/2024 EXAM: 2 VIEW(S) XRAY OF THE CHEST 04/24/2024 08:54:00 AM COMPARISON: None available. CLINICAL HISTORY: near syncope FINDINGS: LUNGS AND PLEURA: No focal pulmonary opacity. No pulmonary edema. No pleural effusion. No pneumothorax. HEART AND MEDIASTINUM: Aortic arch calcifications. No acute abnormality of the cardiac and mediastinal silhouettes. BONES AND SOFT TISSUES: No acute osseous abnormality. IMPRESSION: 1. No acute cardiopulmonary process. Electronically signed by: Norleen Boxer MD 04/24/2024 09:24 AM EDT RP Workstation: [id number]     Procedures   Medications Ordered in the ED  iohexol (OMNIPAQUE) 350 MG/ML injection 75 mL (75 mLs Intravenous Contrast Given 04/24/24 1320)                                    Medical Decision Making Amount and/or Complexity of Data Reviewed Radiology: ordered.  Risk Prescription drug management.   Patient presents after episode of dizziness/lightheadedness since this morning, last known normal yesterday evening.  Patient signs and symptoms have resolved since that event.  Patient has minimal symptoms with turning his head each way but they are very mild.  Patient has normal neurologic exam normal gait in the ED.  Patient does have 2+ heart murmur, patient denies any recent echo.  Patient does not have any known aortic stenosis.  Differential broad including aortic valve related,  anemia, dehydration, vertigo central versus peripheral, stroke, carotid artery stenosis, other.  Blood work independent reviewed electrolytes unremarkable, hemoglobin normal, white blood cell count 10, vital signs reassuring mild hypertension which patient is followed for.  Patient did eat significant candy before bed so may have spiked and dropped his glucose as well.  CT angiogram head and neck ordered to look for significant stenosis.  Urgent follow-up and referral for cardiology for echo and further evaluation.  Patient comfortable with this plan.  CT results independently reviewed less than 50% stenosis of carotids, no acute stroke or  other acute abnormalities.  Patient has normal neurologic exam at discharge smiling and comfortable going home.     Final diagnoses:  Dizziness    ED Discharge Orders          Ordered    Ambulatory referral to Cardiology       Comments: If you have not heard from the Cardiology office within the next 72 hours please call [phone].   04/24/24 1217               Tonia Chew, MD 04/24/24 1435    Tonia Chew, MD 04/24/24 [phone]

## 2024-04-24 NOTE — ED Provider Triage Note (Signed)
 Emergency Medicine Provider Triage Evaluation Note  Craig Roberson , a 68 y.o. male  was evaluated in triage.  Pt complains of lightheadedness with position change starting around 0400 this AM when he woke up.  Denies any vision changes, headache, trouble walking, trouble talking.  Lightheadedness only occurs with change of position.  Currently not experiencing any except for when he bends down and stands back up.  He denies any chest pain or shortness of breath he reports that he has felt this before when he had A-fib.  His A-fib is controlled after an ablation 10 years ago.  Still on metoprolol .  No blood thinner use.  Denies any chest pain or shortness of breath.  Reports that he did feel nausea with some sweaty palms when the dizziness first happened this morning.  Review of Systems  Positive:  Negative:   Physical Exam  BP (!) 158/94 (BP Location: Right Arm)   Pulse 76   Temp 97.7 F (36.5 C) (Oral)   Resp 20   Ht 5' 9 (1.753 m)   Wt 104.3 kg   SpO2 97%   BMI 33.97 kg/m  Gen:   Awake, no distress   Resp:  Normal effort  MSK:   Moves extremities without difficulty  Other:  Answering questions appropriate appropriate speech.  Cranial nerves grossly intact.  Symmetric strength in upper extremities.  Regular rate and rhythm.  No focal deficit.  Medical Decision Making  Medically screening exam initiated at 8:11 AM.  Appropriate orders placed.  Craig Roberson was informed that the remainder of the evaluation will be completed by another provider, this initial triage assessment does not replace that evaluation, and the importance of remaining in the ED until their evaluation is complete.  EKG obtained showing normal sinus rhythm.  Lab work ordered.   Craig Roberson, [place] 04/24/24 [phone]

## 2024-04-24 NOTE — Discharge Instructions (Signed)
 Follow-up with cardiology and primary doctor in the next week.  Return for new or worsening signs or symptoms.  Stay well-hydrated.

## 2024-04-24 NOTE — ED Notes (Signed)
 Pt ambulated to bathroom with no assistance.

## 2024-04-29 DIAGNOSIS — E291 Testicular hypofunction: Secondary | ICD-10-CM | POA: Diagnosis not present

## 2024-04-29 DIAGNOSIS — E1169 Type 2 diabetes mellitus with other specified complication: Secondary | ICD-10-CM | POA: Diagnosis not present

## 2024-04-29 DIAGNOSIS — I10 Essential (primary) hypertension: Secondary | ICD-10-CM | POA: Diagnosis not present

## 2024-04-29 DIAGNOSIS — I6529 Occlusion and stenosis of unspecified carotid artery: Secondary | ICD-10-CM | POA: Diagnosis not present

## 2024-04-29 DIAGNOSIS — Z6833 Body mass index (BMI) 33.0-33.9, adult: Secondary | ICD-10-CM | POA: Diagnosis not present

## 2024-04-29 NOTE — Progress Notes (Deleted)
 Cardiology Office Note:  .   Date:  04/29/2024  ID:  Craig Roberson, DOB [birth year], MRN [id number] PCP: [facility] Providers Cardiologist:  None Electrophysiologist:  Will Gladis Norton, MD {  History of Present Illness: .   Craig Roberson is a 68 y.o. male w/PMHx of  HTN, HLD, OSA, DM AFib/flutter PVCs  He saw Dr. Norton 08/30/22, doing well, off OAC with no recurrent arrhythmia post ablation + PVCs on EKG, low burden by symptoms, caffeine sensitive  He saw Jodie 08/27/23, off flecainide , doing well, no symptoms of arrhythmia/Afib.   Discussed an increase his his CHA2DS2Vasc score to 3, and monitoring options Pt preferred not to pursue OAC > and would look into wearable tech vs kardia  ER visit 04/24/24: acute onset lightheaded, dizzy >> better with mild symptoms when turning head left or right CT by ER MD less than 50% stenosis of carotids, no acute stroke or other acute abnormalities > +/- poss BS abnormalities having eaten a candy bar before bed Mentioned SM on exam discharged  Today's visit is scheduled as a 6 mo visit ROS:   *** symptoms *** watch, kardia... *** monitor for AF? Vertigo? *** murmur? Update echo  Arrhythmia/AAD hx AFib and CTI ablation, 2018 (cardioverted an atypical flutter)  Studies Reviewed: SABRA    EKG not done today 04/24/24: personally reviewed SR 67bpm  09/28/2017: EPS/ablation CONCLUSIONS: 1. Sinus rhythm upon presentation.   2. Rotational Angiography reveals a moderate sized left atrium with four separate pulmonary veins without evidence of pulmonary vein stenosis. 3. Successful electrical isolation and anatomical encircling of all four pulmonary veins with radiofrequency current.    4. Cavo-tricuspid isthmus ablation was performed with complete bidirectional isthmus block achieved.  5. Cardioversion out of atypical atrial flutter 6. No early apparent complications.  TTE 04/22/15   - Left ventricle: The cavity size was normal. There was mild focal   basal hypertrophy of the septum. Systolic function was normal.   The estimated ejection fraction was in the range of 55% to 60%.   Wall motion was normal; there were no regional wall motion   abnormalities. Left ventricular diastolic function parameters   were normal. - Right ventricle: The cavity size was normal. Wall thickness was   normal. Systolic function was normal. - Atrial septum: No defect or patent foramen ovale was identified. - Tricuspid valve: There was no regurgitation. - Pulmonic valve: There was trivial regurgitation. - Inferior vena cava: The vessel was normal in size. The   respirophasic diameter changes were in the normal range (>= 50%),   consistent with normal central venous pressure.   Risk Assessment/Calculations:    Physical Exam:   VS:  There were no vitals taken for this visit.   Wt Readings from Last 3 Encounters:  04/24/24 230 lb (104.3 kg)  08/27/23 232 lb 6.4 oz (105.4 kg)  10/27/22 232 lb 9.6 oz (105.5 kg)    GEN: Well nourished, well developed in no acute distress NECK: No JVD; No carotid bruits CARDIAC: ***RRR, no murmurs, rubs, gallops RESPIRATORY:  *** CTA b/l without rales, wheezing or rhonchi  ABDOMEN: Soft, non-tender, non-distended EXTREMITIES: *** No edema; No deformity   ASSESSMENT AND PLAN: .    paroxysmal AFib AFlutter PVI and CTI ablation 2018 Subsequently off OAC without recurrent arrhythmia symptoms CHA2DS2Vasc is 3, prior discussions on OAC > deferred, pt preference  ***  HTN ***  SM  ***     {  Are you ordering a CV Procedure (e.g. stress test, cath, DCCV, TEE, etc)?   Press F2        :[id number]}     Dispo: ***  Signed, Charlies Macario [name], PA-C

## 2024-05-01 ENCOUNTER — Ambulatory Visit: Admitting: Physician Assistant

## 2024-05-07 NOTE — Progress Notes (Unsigned)
 Cardiology Office Note:  .   Date:  05/07/2024  ID:  Curtistine ORN Michaelsen, DOB [birth year], MRN [id number] PCP: [facility] Providers Cardiologist:  None Electrophysiologist:  Will Gladis Norton, MD {  History of Present Illness: .   Segundo Makela Smouse is a 68 y.o. male w/PMHx of  HTN, HLD, OSA, DM AFib/flutter PVCs  He saw Dr. Norton 08/30/22, doing well, off OAC with no recurrent arrhythmia post ablation + PVCs on EKG, low burden by symptoms, caffeine sensitive  He saw Jodie 08/27/23, off flecainide , doing well, no symptoms of arrhythmia/Afib.   Discussed an increase his his CHA2DS2Vasc score to 3, and monitoring options Pt preferred not to pursue OAC > and would look into wearable tech vs kardia  ER visit 04/24/24: acute onset lightheaded, dizzy >> better with mild symptoms when turning head left or right CT by ER MD less than 50% stenosis of carotids, no acute stroke or other acute abnormalities > +/- poss BS abnormalities having eaten a candy bar before bed Mentioned SM on exam discharged  Today's visit is scheduled as a 6 mo visit ROS:   He feels quite well. No recurrent dizziness.  Reports he woke up that day with the room/bed spinning!  Was alarming, sat up and after a few minutes seemed to stettle sume, but not go away > went to the ER. Subsequently read that sleeping with head elevated some can be helpful so started to slee with 2 pillows (instead of none) and has not had it agagin.  He goes to the gym for exercise, usually pretty regularly, treadmill, some weights, reports excellent exertional capacity.  He has had over the years a random fleeting R sided chest ache, not exertional, positional, just comes/goes.  No CP otherwise and none with exercise. No SOB, DOE No near syncope or suncope  Years ago very symptomatic with the AFib, he does think he would feel it if he had some No palpitations, no symptoms of his  AFib  Arrhythmia/AAD hx AFib and CTI ablation, 09/28/2016 (cardioverted an atypical flutter)  Studies Reviewed: SABRA    EKG not done today 04/24/24: personally reviewed SR 67bpm  09/28/2016: EPS/ablation CONCLUSIONS: 1. Sinus rhythm upon presentation.   2. Rotational Angiography reveals a moderate sized left atrium with four separate pulmonary veins without evidence of pulmonary vein stenosis. 3. Successful electrical isolation and anatomical encircling of all four pulmonary veins with radiofrequency current.    4. Cavo-tricuspid isthmus ablation was performed with complete bidirectional isthmus block achieved.  5. Cardioversion out of atypical atrial flutter 6. No early apparent complications.  TTE 04/22/15  - Left ventricle: The cavity size was normal. There was mild focal   basal hypertrophy of the septum. Systolic function was normal.   The estimated ejection fraction was in the range of 55% to 60%.   Wall motion was normal; there were no regional wall motion   abnormalities. Left ventricular diastolic function parameters   were normal. - Right ventricle: The cavity size was normal. Wall thickness was   normal. Systolic function was normal. - Atrial septum: No defect or patent foramen ovale was identified. - Tricuspid valve: There was no regurgitation. - Pulmonic valve: There was trivial regurgitation. - Inferior vena cava: The vessel was normal in size. The   respirophasic diameter changes were in the normal range (>= 50%),   consistent with normal central venous pressure.   Risk Assessment/Calculations:    Physical Exam:  VS:  There were no vitals taken for this visit.   Wt Readings from Last 3 Encounters:  04/24/24 230 lb (104.3 kg)  08/27/23 232 lb 6.4 oz (105.4 kg)  10/27/22 232 lb 9.6 oz (105.5 kg)    GEN: Well nourished, well developed in no acute distress NECK: No JVD; No carotid bruits CARDIAC: RRR, 2/6SM, slight DM, rubs, gallops RESPIRATORY:  CTA b/l without  rales, wheezing or rhonchi  ABDOMEN: Soft, non-tender, non-distended EXTREMITIES: No edema; No deformity   ASSESSMENT AND PLAN: .    paroxysmal AFib AFlutter PVI and CTI ablation 2018 Subsequently off OAC without recurrent arrhythmia symptoms CHA2DS2Vasc is 3, prior discussions on OAC > deferred, pt preference  Discussed monitoring strategies for recurrent AFib and rational He has an apple watch at home, does think it gives cardiac data/alerts, doesn't tend to wear it, likes his usual watch He will switch to his apple watch  HTN Looks good  SM  Update his echo  Dispo: back in 6 mo pending his echo, sooner if needed >> perhaps annual visit  Signed, Charlies Macario [name], PA-C

## 2024-05-09 ENCOUNTER — Ambulatory Visit: Attending: Physician Assistant | Admitting: Physician Assistant

## 2024-05-09 VITALS — BP 116/74 | HR 64 | Ht 69.0 in | Wt 233.0 lb

## 2024-05-09 DIAGNOSIS — R011 Cardiac murmur, unspecified: Secondary | ICD-10-CM | POA: Diagnosis not present

## 2024-05-09 DIAGNOSIS — I10 Essential (primary) hypertension: Secondary | ICD-10-CM | POA: Diagnosis not present

## 2024-05-09 DIAGNOSIS — I480 Paroxysmal atrial fibrillation: Secondary | ICD-10-CM

## 2024-05-09 NOTE — Patient Instructions (Addendum)
 Medication Instructions:   Your physician recommends that you continue on your current medications as directed. Please refer to the Current Medication list given to you today.   *If you need a refill on your cardiac medications before your next appointment, please call your pharmacy*    Lab Work: NONE ORDERED  TODAY     If you have labs (blood work) drawn today and your tests are completely normal, you will receive your results only by: MyChart Message (if you have MyChart) OR A paper copy in the mail If you have any lab test that is abnormal or we need to change your treatment, we will call you to review the results.     Testing/Procedures:  Your physician has requested that you have an echocardiogram. Echocardiography is a painless test that uses sound waves to create images of your heart. It provides your doctor with information about the size and shape of your heart and how well your heart's chambers and valves are working. This procedure takes approximately one hour. There are no restrictions for this procedure. Please do NOT wear cologne, perfume, aftershave, or lotions (deodorant is allowed). Please arrive 15 minutes prior to your appointment time.  Please note: We ask at that you not bring children with you during ultrasound (echo/ vascular) testing. Due to room size and safety concerns, children are not allowed in the ultrasound rooms during exams. Our front office staff cannot provide observation of children in our lobby area while testing is being conducted. An adult accompanying a patient to their appointment will only be allowed in the ultrasound room at the discretion of the ultrasound technician under special circumstances. We apologize for any inconvenience.    Follow-Up: At [facility], you and your health needs are our priority.  As part of our continuing mission to provide you with exceptional heart care, our providers are all part of one team.  This team  includes your primary Cardiologist (physician) and Advanced Practice Providers or APPs (Physician Assistants and Nurse Practitioners) who all work together to provide you with the care you need, when you need it.   Your next appointment:    6 month(s)   Provider:    You may see  Charlies [name], PA-C     We recommend signing up for the patient portal called MyChart.  Sign up information is provided on this After Visit Summary.  MyChart is used to connect with patients for Virtual Visits (Telemedicine).  Patients are able to view lab/test results, encounter notes, upcoming appointments, etc.  Non-urgent messages can be sent to your provider as well.   To learn more about what you can do with MyChart, go to [email or web address].   Other Instructions

## 2024-05-13 DIAGNOSIS — E291 Testicular hypofunction: Secondary | ICD-10-CM | POA: Diagnosis not present

## 2024-05-27 DIAGNOSIS — E291 Testicular hypofunction: Secondary | ICD-10-CM | POA: Diagnosis not present

## 2024-06-17 ENCOUNTER — Ambulatory Visit (HOSPITAL_COMMUNITY)
Admission: RE | Admit: 2024-06-17 | Discharge: 2024-06-17 | Disposition: A | Discharge status: Home / Self Care | Source: Ambulatory Visit | Attending: Physician Assistant | Admitting: Physician Assistant

## 2024-06-17 DIAGNOSIS — R011 Cardiac murmur, unspecified: Secondary | ICD-10-CM

## 2024-06-17 DIAGNOSIS — I480 Paroxysmal atrial fibrillation: Secondary | ICD-10-CM | POA: Insufficient documentation

## 2024-06-17 LAB — ECHOCARDIOGRAM COMPLETE
AR max vel: 2.09 cm2
AV Area VTI: 2.15 cm2
AV Area mean vel: 2.01 cm2
AV Mean grad: 9.6 mmHg
AV Peak grad: 17.1 mmHg
Ao pk vel: 2.07 m/s
Area-P 1/2: 3.71 cm2
S' Lateral: 3.4 cm

## 2024-06-18 ENCOUNTER — Ambulatory Visit: Payer: Self-pay | Admitting: Physician Assistant
